# Patient Record
Sex: Male | Born: 1960 | Race: White | Hispanic: No | Marital: Married | Smoking: Never smoker
Health system: Southern US, Community
[De-identification: ages and names within clinical notes are randomized; demographics above are authoritative.]

## PROBLEM LIST (undated history)

## (undated) DIAGNOSIS — R519 Headache, unspecified: Secondary | ICD-10-CM

## (undated) DIAGNOSIS — J189 Pneumonia, unspecified organism: Secondary | ICD-10-CM

## (undated) DIAGNOSIS — Z8719 Personal history of other diseases of the digestive system: Secondary | ICD-10-CM

## (undated) DIAGNOSIS — K219 Gastro-esophageal reflux disease without esophagitis: Secondary | ICD-10-CM

## (undated) DIAGNOSIS — E039 Hypothyroidism, unspecified: Secondary | ICD-10-CM

## (undated) DIAGNOSIS — N400 Enlarged prostate without lower urinary tract symptoms: Secondary | ICD-10-CM

## (undated) DIAGNOSIS — M21372 Foot drop, left foot: Secondary | ICD-10-CM

## (undated) DIAGNOSIS — N529 Male erectile dysfunction, unspecified: Secondary | ICD-10-CM

## (undated) DIAGNOSIS — Z9989 Dependence on other enabling machines and devices: Secondary | ICD-10-CM

## (undated) DIAGNOSIS — M1990 Unspecified osteoarthritis, unspecified site: Secondary | ICD-10-CM

## (undated) DIAGNOSIS — I38 Endocarditis, valve unspecified: Secondary | ICD-10-CM

## (undated) DIAGNOSIS — I10 Essential (primary) hypertension: Secondary | ICD-10-CM

## (undated) DIAGNOSIS — D649 Anemia, unspecified: Secondary | ICD-10-CM

## (undated) DIAGNOSIS — I739 Peripheral vascular disease, unspecified: Secondary | ICD-10-CM

## (undated) DIAGNOSIS — J45909 Unspecified asthma, uncomplicated: Secondary | ICD-10-CM

## (undated) DIAGNOSIS — J302 Other seasonal allergic rhinitis: Secondary | ICD-10-CM

## (undated) DIAGNOSIS — G709 Myoneural disorder, unspecified: Secondary | ICD-10-CM

## (undated) DIAGNOSIS — E785 Hyperlipidemia, unspecified: Secondary | ICD-10-CM

## (undated) HISTORY — PX: BACK SURGERY: SHX140

## (undated) HISTORY — PX: PROSTATE BIOPSY: SHX241

## (undated) HISTORY — PX: UPPER GI ENDOSCOPY: SHX6162

## (undated) HISTORY — PX: COLONOSCOPY: SHX174

## (undated) HISTORY — PX: OTHER SURGICAL HISTORY: SHX169

## (undated) HISTORY — PX: HERNIA REPAIR: SHX51

## (undated) HISTORY — PX: HEMORRHOID SURGERY: SHX153

---

## 1980-07-27 HISTORY — PX: HAND SURGERY: SHX662

## 2017-07-27 HISTORY — PX: CARDIAC CATHETERIZATION: SHX172

## 2017-07-27 HISTORY — PX: VEIN SURGERY: SHX48

## 2020-07-08 ENCOUNTER — Other Ambulatory Visit: Payer: Self-pay | Admitting: Neurological Surgery

## 2020-07-08 DIAGNOSIS — M4802 Spinal stenosis, cervical region: Secondary | ICD-10-CM

## 2020-07-08 DIAGNOSIS — G959 Disease of spinal cord, unspecified: Secondary | ICD-10-CM

## 2020-08-05 NOTE — Pre-Procedure Instructions (Signed)
Your procedure is scheduled on Fri., Jan. 14, 2022 from 10:46AM-2:52PM.  Report to [facility] Entrance "A" at 8:45AM.  Call this number if you have problems the morning of surgery:  [phone]   Remember:  Do not eat or drink after midnight on Jan. 13th    Take these medicines the morning of surgery with A SIP OF WATER: Fluticasone (FLONASE)  Gabapentin (NEURONTIN)   Levothyroxine (SYNTHROID) Omeprazole (PRILOSEC) Rosuvastatin (CRESTOR)  If Needed: Albuterol (VENTOLIN HFA)- bring with you day of surgery Budesonide-formoterol (SYMBICORT)- bring with you day of surgery  As of today, STOP taking all Aspirin (unless instructed by your doctor) and Other Aspirin containing products, Vitamins, Fish oils, and Herbal medications. Also stop all NSAIDS i.e. Advil, Ibuprofen, Motrin, Aleve, Anaprox, Naproxen, BC, Goody Powders, and all Supplements.   No Smoking of any kind, Tobacco/Vaping, or Alcohol products 24 hours prior to your procedure. If you use a Cpap at night, you may bring all equipment for your overnight stay.   Special instructions:   [place]- Preparing For Surgery  Before surgery, you can play an important role. Because skin is not sterile, your skin needs to be as free of germs as possible. You can reduce the number of germs on your skin by washing with CHG (chlorahexidine gluconate) Soap before surgery.  CHG is an antiseptic cleaner which kills germs and bonds with the skin to continue killing germs even after washing.  Please do not use if you have an allergy to CHG or antibacterial soaps. If your skin becomes reddened/irritated stop using the CHG.  Do not shave (including legs and underarms) for at least 48 hours prior to first CHG shower. It is OK to shave your face.  Please follow these instructions carefully.   1. Shower the NIGHT BEFORE SURGERY and the MORNING OF SURGERY with CHG.   2. If you chose to wash your hair, wash your hair first as usual with  your normal shampoo.  3. After you shampoo, rinse your hair and body thoroughly to remove the shampoo.  4. Use CHG as you would any other liquid soap. You can apply CHG directly to the skin and wash gently with a scrungie or a clean washcloth.   5. Apply the CHG Soap to your body ONLY FROM THE NECK DOWN.  Do not use on open wounds or open sores. Avoid contact with your eyes, ears, mouth and genitals (private parts). Wash Face and genitals (private parts)  with your normal soap.  6. Wash thoroughly, paying special attention to the area where your surgery will be performed.  7. Thoroughly rinse your body with warm water from the neck down.  8. DO NOT shower/wash with your normal soap after using and rinsing off the CHG Soap.  9. Pat yourself dry with a CLEAN TOWEL.  10. Wear CLEAN PAJAMAS to bed the night before surgery, wear comfortable clothes the morning of surgery  11. Place CLEAN SHEETS on your bed the night of your first shower and DO NOT SLEEP WITH PETS.   Day of Surgery:             Remember to brush your teeth WITH YOUR REGULAR TOOTHPASTE.  Do not wear jewelry.  Do not wear lotions, powders, colognes, or deodorant.  Do not shave 48 hours prior to surgery.  Men may shave face and neck.  Do not bring valuables to the hospital.  [facility] is not responsible for any belongings or valuables.  Contacts,  dentures or bridgework may not be worn into surgery.    For patients admitted to the hospital, discharge time will be determined by your treatment team.  Patients discharged the day of surgery will not be allowed to drive home, and someone age 61 and over needs to stay with them for 24 hours.  Please wear clean clothes to the hospital/surgery center.    Please read over the following fact sheets that you were given.

## 2020-08-06 ENCOUNTER — Other Ambulatory Visit (HOSPITAL_COMMUNITY): Payer: Self-pay

## 2020-08-06 ENCOUNTER — Inpatient Hospital Stay (HOSPITAL_COMMUNITY)
Admission: RE | Admit: 2020-08-06 | Discharge: 2020-08-06 | Disposition: A | Discharge status: Home / Self Care | Payer: Self-pay | Source: Ambulatory Visit

## 2020-08-07 ENCOUNTER — Encounter (HOSPITAL_COMMUNITY): Payer: Self-pay | Admitting: Neurological Surgery

## 2020-08-07 ENCOUNTER — Other Ambulatory Visit: Payer: Self-pay

## 2020-08-07 NOTE — Progress Notes (Signed)
PCP - Dr Georgiann Cocker Cardiologist - [facility]- Dr Allena Katz Urology - Dr Elwin Sleight  Chest x-ray - DOS 08/09/20 EKG - DOS 08/09/20 Stress Test - n/a ECHO - n/a Cardiac Cath - 02/01/18 CE  Anesthesia review: Yes  STOP now taking any Aspirin (unless otherwise instructed by your surgeon), Aleve, Naproxen, Ibuprofen, Motrin, Advil, Goody's, BC's, all herbal medications, fish oil, and all vitamins.   Coronavirus Screening Covid test on DOS 08/09/20. Do you have any of the following symptoms:  Cough yes/no: No Fever (>100.79F)  yes/no: No Runny nose yes/no: No Sore throat yes/no: No Difficulty breathing/shortness of breath  yes/no: No  Have you traveled in the last 14 days and where? yes/no: No  Patient verbalized understanding of instructions that were given via phone.

## 2020-08-09 ENCOUNTER — Inpatient Hospital Stay (HOSPITAL_COMMUNITY)
Admission: RE | Disposition: A | Discharge status: Rehabilitation Facility | Payer: Self-pay | Source: Home / Self Care | Attending: Neurological Surgery

## 2020-08-09 ENCOUNTER — Inpatient Hospital Stay (HOSPITAL_COMMUNITY): Payer: BC Managed Care – PPO

## 2020-08-09 ENCOUNTER — Encounter (HOSPITAL_COMMUNITY): Payer: Self-pay | Admitting: Neurological Surgery

## 2020-08-09 ENCOUNTER — Inpatient Hospital Stay (HOSPITAL_COMMUNITY): Payer: BC Managed Care – PPO | Admitting: Anesthesiology

## 2020-08-09 ENCOUNTER — Inpatient Hospital Stay (HOSPITAL_COMMUNITY)
Admission: RE | Admit: 2020-08-09 | Discharge: 2020-08-14 | DRG: 472 | Disposition: A | Discharge status: Rehabilitation Facility | Payer: BC Managed Care – PPO | Attending: Neurological Surgery | Admitting: Neurological Surgery

## 2020-08-09 ENCOUNTER — Other Ambulatory Visit: Payer: Self-pay

## 2020-08-09 DIAGNOSIS — Z7951 Long term (current) use of inhaled steroids: Secondary | ICD-10-CM

## 2020-08-09 DIAGNOSIS — I739 Peripheral vascular disease, unspecified: Secondary | ICD-10-CM | POA: Diagnosis present

## 2020-08-09 DIAGNOSIS — Z981 Arthrodesis status: Secondary | ICD-10-CM | POA: Diagnosis not present

## 2020-08-09 DIAGNOSIS — Z7982 Long term (current) use of aspirin: Secondary | ICD-10-CM | POA: Diagnosis not present

## 2020-08-09 DIAGNOSIS — Z888 Allergy status to other drugs, medicaments and biological substances status: Secondary | ICD-10-CM | POA: Diagnosis not present

## 2020-08-09 DIAGNOSIS — E785 Hyperlipidemia, unspecified: Secondary | ICD-10-CM | POA: Diagnosis present

## 2020-08-09 DIAGNOSIS — G8918 Other acute postprocedural pain: Secondary | ICD-10-CM | POA: Diagnosis not present

## 2020-08-09 DIAGNOSIS — M50021 Cervical disc disorder at C4-C5 level with myelopathy: Secondary | ICD-10-CM | POA: Diagnosis present

## 2020-08-09 DIAGNOSIS — I10 Essential (primary) hypertension: Secondary | ICD-10-CM

## 2020-08-09 DIAGNOSIS — N319 Neuromuscular dysfunction of bladder, unspecified: Secondary | ICD-10-CM | POA: Diagnosis present

## 2020-08-09 DIAGNOSIS — M4712 Other spondylosis with myelopathy, cervical region: Secondary | ICD-10-CM | POA: Diagnosis present

## 2020-08-09 DIAGNOSIS — Z4789 Encounter for other orthopedic aftercare: Secondary | ICD-10-CM | POA: Diagnosis not present

## 2020-08-09 DIAGNOSIS — E039 Hypothyroidism, unspecified: Secondary | ICD-10-CM | POA: Diagnosis present

## 2020-08-09 DIAGNOSIS — Z79899 Other long term (current) drug therapy: Secondary | ICD-10-CM | POA: Diagnosis not present

## 2020-08-09 DIAGNOSIS — K219 Gastro-esophageal reflux disease without esophagitis: Secondary | ICD-10-CM | POA: Diagnosis present

## 2020-08-09 DIAGNOSIS — G8220 Paraplegia, unspecified: Secondary | ICD-10-CM | POA: Diagnosis not present

## 2020-08-09 DIAGNOSIS — Z20822 Contact with and (suspected) exposure to covid-19: Secondary | ICD-10-CM | POA: Diagnosis present

## 2020-08-09 DIAGNOSIS — Z419 Encounter for procedure for purposes other than remedying health state, unspecified: Secondary | ICD-10-CM

## 2020-08-09 DIAGNOSIS — J449 Chronic obstructive pulmonary disease, unspecified: Secondary | ICD-10-CM | POA: Diagnosis present

## 2020-08-09 DIAGNOSIS — R252 Cramp and spasm: Secondary | ICD-10-CM | POA: Diagnosis not present

## 2020-08-09 DIAGNOSIS — G959 Disease of spinal cord, unspecified: Secondary | ICD-10-CM

## 2020-08-09 DIAGNOSIS — Z79890 Hormone replacement therapy (postmenopausal): Secondary | ICD-10-CM | POA: Diagnosis not present

## 2020-08-09 DIAGNOSIS — M4802 Spinal stenosis, cervical region: Secondary | ICD-10-CM | POA: Diagnosis present

## 2020-08-09 DIAGNOSIS — E871 Hypo-osmolality and hyponatremia: Secondary | ICD-10-CM | POA: Diagnosis not present

## 2020-08-09 DIAGNOSIS — N400 Enlarged prostate without lower urinary tract symptoms: Secondary | ICD-10-CM | POA: Diagnosis present

## 2020-08-09 DIAGNOSIS — M5412 Radiculopathy, cervical region: Secondary | ICD-10-CM

## 2020-08-09 HISTORY — DX: Unspecified osteoarthritis, unspecified site: M19.90

## 2020-08-09 HISTORY — DX: Benign prostatic hyperplasia without lower urinary tract symptoms: N40.0

## 2020-08-09 HISTORY — DX: Essential (primary) hypertension: I10

## 2020-08-09 HISTORY — DX: Myoneural disorder, unspecified: G70.9

## 2020-08-09 HISTORY — DX: Dependence on other enabling machines and devices: Z99.89

## 2020-08-09 HISTORY — PX: ANTERIOR CERVICAL CORPECTOMY: SHX1159

## 2020-08-09 HISTORY — DX: Hyperlipidemia, unspecified: E78.5

## 2020-08-09 HISTORY — PX: ANTERIOR CERVICAL DECOMP/DISCECTOMY FUSION: SHX1161

## 2020-08-09 HISTORY — DX: Gastro-esophageal reflux disease without esophagitis: K21.9

## 2020-08-09 HISTORY — DX: Peripheral vascular disease, unspecified: I73.9

## 2020-08-09 HISTORY — DX: Unspecified asthma, uncomplicated: J45.909

## 2020-08-09 HISTORY — DX: Male erectile dysfunction, unspecified: N52.9

## 2020-08-09 HISTORY — DX: Other seasonal allergic rhinitis: J30.2

## 2020-08-09 HISTORY — DX: Endocarditis, valve unspecified: I38

## 2020-08-09 HISTORY — DX: Hypothyroidism, unspecified: E03.9

## 2020-08-09 HISTORY — DX: Anemia, unspecified: D64.9

## 2020-08-09 HISTORY — DX: Headache, unspecified: R51.9

## 2020-08-09 HISTORY — DX: Personal history of other diseases of the digestive system: Z87.19

## 2020-08-09 HISTORY — DX: Foot drop, left foot: M21.372

## 2020-08-09 LAB — CBC WITH DIFFERENTIAL/PLATELET
Abs Immature Granulocytes: 0.03 10*3/uL (ref 0.00–0.07)
Basophils Absolute: 0.1 10*3/uL (ref 0.0–0.1)
Basophils Relative: 1 %
Eosinophils Absolute: 0.3 10*3/uL (ref 0.0–0.5)
Eosinophils Relative: 3 %
HCT: 43.7 % (ref 39.0–52.0)
Hemoglobin: 14.8 g/dL (ref 13.0–17.0)
Immature Granulocytes: 0 %
Lymphocytes Relative: 18 %
Lymphs Abs: 1.5 10*3/uL (ref 0.7–4.0)
MCH: 29.8 pg (ref 26.0–34.0)
MCHC: 33.9 g/dL (ref 30.0–36.0)
MCV: 88.1 fL (ref 80.0–100.0)
Monocytes Absolute: 0.5 10*3/uL (ref 0.1–1.0)
Monocytes Relative: 5 %
Neutro Abs: 6.2 10*3/uL (ref 1.7–7.7)
Neutrophils Relative %: 73 %
Platelets: 187 10*3/uL (ref 150–400)
RBC: 4.96 MIL/uL (ref 4.22–5.81)
RDW: 12.3 % (ref 11.5–15.5)
WBC: 8.6 10*3/uL (ref 4.0–10.5)
nRBC: 0 % (ref 0.0–0.2)

## 2020-08-09 LAB — TYPE AND SCREEN
ABO/RH(D): O POS
Antibody Screen: NEGATIVE

## 2020-08-09 LAB — PROTIME-INR
INR: 1 (ref 0.8–1.2)
Prothrombin Time: 12.6 seconds (ref 11.4–15.2)

## 2020-08-09 LAB — BASIC METABOLIC PANEL
Anion gap: 11 (ref 5–15)
BUN: 17 mg/dL (ref 6–20)
CO2: 26 mmol/L (ref 22–32)
Calcium: 9.5 mg/dL (ref 8.9–10.3)
Chloride: 104 mmol/L (ref 98–111)
Creatinine, Ser: 1.03 mg/dL (ref 0.61–1.24)
GFR, Estimated: >60 mL/min (ref >60)
Glucose, Bld: 104 mg/dL — ABNORMAL HIGH (ref 70–99)
Potassium: 4.6 mmol/L (ref 3.5–5.1)
Sodium: 141 mmol/L (ref 135–145)

## 2020-08-09 LAB — SARS CORONAVIRUS 2 BY RT PCR (HOSPITAL ORDER, PERFORMED IN ~~LOC~~ HOSPITAL LAB): SARS Coronavirus 2: NEGATIVE

## 2020-08-09 LAB — ABO/RH: ABO/RH(D): O POS

## 2020-08-09 SURGERY — ANTERIOR CERVICAL DECOMPRESSION/DISCECTOMY FUSION 1 LEVEL
Anesthesia: General

## 2020-08-09 MED ORDER — ONDANSETRON HCL 4 MG PO TABS: 4.0000 mg | ORAL_TABLET | Freq: Four times a day (QID) | ORAL | Status: DC | PRN

## 2020-08-09 MED ORDER — SENNA 8.6 MG PO TABS
1.0000 | ORAL_TABLET | Freq: Two times a day (BID) | ORAL | Status: DC
  Administered 2020-08-09 – 2020-08-14 (×10): 8.6 mg via ORAL
  Filled 2020-08-09 (×10): qty 1

## 2020-08-09 MED ORDER — ONDANSETRON HCL 4 MG/2ML IJ SOLN
INTRAMUSCULAR | Status: DC | PRN
  Administered 2020-08-09: 4 mg via INTRAVENOUS

## 2020-08-09 MED ORDER — PHENYLEPHRINE 40 MCG/ML (10ML) SYRINGE FOR IV PUSH (FOR BLOOD PRESSURE SUPPORT)
PREFILLED_SYRINGE | INTRAVENOUS | Status: AC
  Filled 2020-08-09: qty 10

## 2020-08-09 MED ORDER — FENTANYL CITRATE (PF) 250 MCG/5ML IJ SOLN
INTRAMUSCULAR | Status: AC
  Filled 2020-08-09: qty 5

## 2020-08-09 MED ORDER — SUGAMMADEX SODIUM 200 MG/2ML IV SOLN
INTRAVENOUS | Status: DC | PRN
  Administered 2020-08-09 (×2): 100 mg via INTRAVENOUS

## 2020-08-09 MED ORDER — SODIUM CHLORIDE 0.9% FLUSH: 3.0000 mL | INTRAVENOUS | Status: DC | PRN

## 2020-08-09 MED ORDER — ALBUMIN HUMAN 5 % IV SOLN: INTRAVENOUS | Status: DC | PRN

## 2020-08-09 MED ORDER — MIDAZOLAM HCL 5 MG/5ML IJ SOLN
INTRAMUSCULAR | Status: DC | PRN
  Administered 2020-08-09: 2 mg via INTRAVENOUS

## 2020-08-09 MED ORDER — PHENYLEPHRINE 40 MCG/ML (10ML) SYRINGE FOR IV PUSH (FOR BLOOD PRESSURE SUPPORT)
PREFILLED_SYRINGE | INTRAVENOUS | Status: DC | PRN
  Administered 2020-08-09 (×3): 80 ug via INTRAVENOUS

## 2020-08-09 MED ORDER — ORAL CARE MOUTH RINSE: 15.0000 mL | Freq: Once | OROMUCOSAL | Status: AC

## 2020-08-09 MED ORDER — GABAPENTIN 300 MG PO CAPS
300.0000 mg | ORAL_CAPSULE | Freq: Three times a day (TID) | ORAL | Status: DC
  Administered 2020-08-09 – 2020-08-14 (×15): 300 mg via ORAL
  Filled 2020-08-09 (×15): qty 1

## 2020-08-09 MED ORDER — ALBUTEROL SULFATE HFA 108 (90 BASE) MCG/ACT IN AERS: 2.0000 | INHALATION_SPRAY | Freq: Four times a day (QID) | RESPIRATORY_TRACT | Status: DC | PRN

## 2020-08-09 MED ORDER — EPHEDRINE SULFATE-NACL 50-0.9 MG/10ML-% IV SOSY
PREFILLED_SYRINGE | INTRAVENOUS | Status: DC | PRN
  Administered 2020-08-09 (×3): 5 mg via INTRAVENOUS

## 2020-08-09 MED ORDER — CHLORHEXIDINE GLUCONATE CLOTH 2 % EX PADS: 6.0000 | MEDICATED_PAD | Freq: Once | CUTANEOUS | Status: DC

## 2020-08-09 MED ORDER — CEFAZOLIN SODIUM-DEXTROSE 2-4 GM/100ML-% IV SOLN
2.0000 g | Freq: Three times a day (TID) | INTRAVENOUS | Status: AC
  Administered 2020-08-09 – 2020-08-10 (×2): 2 g via INTRAVENOUS
  Filled 2020-08-09 (×2): qty 100

## 2020-08-09 MED ORDER — ONDANSETRON HCL 4 MG/2ML IJ SOLN
INTRAMUSCULAR | Status: AC
  Filled 2020-08-09: qty 2

## 2020-08-09 MED ORDER — GLYCOPYRROLATE 0.2 MG/ML IJ SOLN
INTRAMUSCULAR | Status: DC | PRN
  Administered 2020-08-09: .2 mg via INTRAVENOUS

## 2020-08-09 MED ORDER — LIDOCAINE 2% (20 MG/ML) 5 ML SYRINGE
INTRAMUSCULAR | Status: AC
  Filled 2020-08-09: qty 5

## 2020-08-09 MED ORDER — THROMBIN 5000 UNITS EX SOLR
CUTANEOUS | Status: AC
  Filled 2020-08-09: qty 5000

## 2020-08-09 MED ORDER — DEXAMETHASONE 4 MG PO TABS: 4.0000 mg | ORAL_TABLET | Freq: Four times a day (QID) | ORAL | Status: DC

## 2020-08-09 MED ORDER — THROMBIN (RECOMBINANT) 5000 UNITS EX SOLR
CUTANEOUS | Status: AC
  Filled 2020-08-09: qty 5000

## 2020-08-09 MED ORDER — HYDROCHLOROTHIAZIDE 12.5 MG PO CAPS
12.5000 mg | ORAL_CAPSULE | Freq: Every day | ORAL | Status: DC
  Administered 2020-08-09 – 2020-08-14 (×6): 12.5 mg via ORAL
  Filled 2020-08-09 (×6): qty 1

## 2020-08-09 MED ORDER — ACETAMINOPHEN 325 MG PO TABS
650.0000 mg | ORAL_TABLET | ORAL | Status: DC | PRN
  Administered 2020-08-09 – 2020-08-11 (×4): 650 mg via ORAL
  Filled 2020-08-09 (×4): qty 2

## 2020-08-09 MED ORDER — ACETAMINOPHEN 650 MG RE SUPP: 650.0000 mg | RECTAL | Status: DC | PRN

## 2020-08-09 MED ORDER — DEXAMETHASONE SODIUM PHOSPHATE 10 MG/ML IJ SOLN
10.0000 mg | Freq: Once | INTRAMUSCULAR | Status: AC
  Administered 2020-08-09: 10 mg via INTRAVENOUS
  Filled 2020-08-09: qty 1

## 2020-08-09 MED ORDER — METHOCARBAMOL 500 MG PO TABS
500.0000 mg | ORAL_TABLET | Freq: Four times a day (QID) | ORAL | Status: DC | PRN
  Administered 2020-08-09 – 2020-08-10 (×3): 500 mg via ORAL
  Filled 2020-08-09 (×3): qty 1

## 2020-08-09 MED ORDER — METHOCARBAMOL 1000 MG/10ML IJ SOLN
500.0000 mg | Freq: Four times a day (QID) | INTRAVENOUS | Status: DC | PRN
  Filled 2020-08-09: qty 5

## 2020-08-09 MED ORDER — ROCURONIUM BROMIDE 10 MG/ML (PF) SYRINGE
PREFILLED_SYRINGE | INTRAVENOUS | Status: DC | PRN
  Administered 2020-08-09: 70 mg via INTRAVENOUS

## 2020-08-09 MED ORDER — MIDAZOLAM HCL 2 MG/2ML IJ SOLN
INTRAMUSCULAR | Status: AC
  Filled 2020-08-09: qty 2

## 2020-08-09 MED ORDER — CEFAZOLIN SODIUM-DEXTROSE 2-4 GM/100ML-% IV SOLN
2.0000 g | INTRAVENOUS | Status: AC
  Administered 2020-08-09: 2 g via INTRAVENOUS
  Filled 2020-08-09: qty 100

## 2020-08-09 MED ORDER — GABAPENTIN 300 MG PO CAPS
300.0000 mg | ORAL_CAPSULE | ORAL | Status: DC
  Filled 2020-08-09: qty 1

## 2020-08-09 MED ORDER — GLYCOPYRROLATE PF 0.2 MG/ML IJ SOSY
PREFILLED_SYRINGE | INTRAMUSCULAR | Status: AC
  Filled 2020-08-09: qty 1

## 2020-08-09 MED ORDER — PHENYLEPHRINE HCL (PRESSORS) 10 MG/ML IV SOLN
INTRAVENOUS | Status: AC
  Filled 2020-08-09: qty 1

## 2020-08-09 MED ORDER — ONDANSETRON HCL 4 MG/2ML IJ SOLN: 4.0000 mg | Freq: Four times a day (QID) | INTRAMUSCULAR | Status: DC | PRN

## 2020-08-09 MED ORDER — PROPOFOL 10 MG/ML IV BOLUS
INTRAVENOUS | Status: DC | PRN
  Administered 2020-08-09: 150 mg via INTRAVENOUS

## 2020-08-09 MED ORDER — LACTATED RINGERS IV SOLN: INTRAVENOUS | Status: DC

## 2020-08-09 MED ORDER — HYDROMORPHONE HCL 1 MG/ML IJ SOLN
0.2500 mg | INTRAMUSCULAR | Status: DC | PRN
  Administered 2020-08-09: 0.5 mg via INTRAVENOUS

## 2020-08-09 MED ORDER — OXYCODONE HCL 5 MG PO TABS
5.0000 mg | ORAL_TABLET | ORAL | Status: DC | PRN
  Administered 2020-08-09 – 2020-08-14 (×13): 5 mg via ORAL
  Filled 2020-08-09 (×13): qty 1

## 2020-08-09 MED ORDER — DEXAMETHASONE SODIUM PHOSPHATE 4 MG/ML IJ SOLN
4.0000 mg | Freq: Four times a day (QID) | INTRAMUSCULAR | Status: DC
  Administered 2020-08-09: 4 mg via INTRAVENOUS
  Filled 2020-08-09: qty 1

## 2020-08-09 MED ORDER — MORPHINE SULFATE (PF) 2 MG/ML IV SOLN: 2.0000 mg | INTRAVENOUS | Status: DC | PRN

## 2020-08-09 MED ORDER — DEXAMETHASONE 4 MG PO TABS: 6.0000 mg | ORAL_TABLET | Freq: Four times a day (QID) | ORAL | Status: DC

## 2020-08-09 MED ORDER — MENTHOL 3 MG MT LOZG
1.0000 | LOZENGE | OROMUCOSAL | Status: DC | PRN
Start: 2020-08-09 — End: 2020-08-14

## 2020-08-09 MED ORDER — PROPOFOL 10 MG/ML IV BOLUS
INTRAVENOUS | Status: AC
  Filled 2020-08-09: qty 20

## 2020-08-09 MED ORDER — ADULT MULTIVITAMIN W/MINERALS CH
1.0000 | ORAL_TABLET | Freq: Every day | ORAL | Status: DC
  Administered 2020-08-09 – 2020-08-14 (×6): 1 via ORAL
  Filled 2020-08-09 (×6): qty 1

## 2020-08-09 MED ORDER — THROMBIN 20000 UNITS EX SOLR
CUTANEOUS | Status: AC
  Filled 2020-08-09: qty 20000

## 2020-08-09 MED ORDER — ROCURONIUM BROMIDE 10 MG/ML (PF) SYRINGE
PREFILLED_SYRINGE | INTRAVENOUS | Status: AC
  Filled 2020-08-09: qty 10

## 2020-08-09 MED ORDER — LEVOTHYROXINE SODIUM 25 MCG PO TABS
125.0000 ug | ORAL_TABLET | Freq: Every day | ORAL | Status: DC
  Administered 2020-08-10 – 2020-08-14 (×5): 125 ug via ORAL
  Filled 2020-08-09 (×5): qty 1

## 2020-08-09 MED ORDER — 0.9 % SODIUM CHLORIDE (POUR BTL) OPTIME
TOPICAL | Status: DC | PRN
  Administered 2020-08-09: 1000 mL

## 2020-08-09 MED ORDER — DEXAMETHASONE SODIUM PHOSPHATE 10 MG/ML IJ SOLN
8.0000 mg | Freq: Four times a day (QID) | INTRAMUSCULAR | Status: DC
  Administered 2020-08-09 – 2020-08-10 (×2): 8 mg via INTRAVENOUS
  Filled 2020-08-09 (×2): qty 1

## 2020-08-09 MED ORDER — FENTANYL CITRATE (PF) 250 MCG/5ML IJ SOLN
INTRAMUSCULAR | Status: DC | PRN
  Administered 2020-08-09: 50 ug via INTRAVENOUS
  Administered 2020-08-09: 100 ug via INTRAVENOUS

## 2020-08-09 MED ORDER — PHENYLEPHRINE HCL-NACL 10-0.9 MG/250ML-% IV SOLN
INTRAVENOUS | Status: DC | PRN
  Administered 2020-08-09: 30 ug/min via INTRAVENOUS

## 2020-08-09 MED ORDER — THROMBIN 20000 UNITS EX SOLR
CUTANEOUS | Status: DC | PRN
  Administered 2020-08-09: 20 mL via TOPICAL

## 2020-08-09 MED ORDER — LIDOCAINE 2% (20 MG/ML) 5 ML SYRINGE
INTRAMUSCULAR | Status: DC | PRN
  Administered 2020-08-09: 60 mg via INTRAVENOUS

## 2020-08-09 MED ORDER — MOMETASONE FURO-FORMOTEROL FUM 100-5 MCG/ACT IN AERO
2.0000 | INHALATION_SPRAY | Freq: Two times a day (BID) | RESPIRATORY_TRACT | Status: DC
  Administered 2020-08-09 – 2020-08-14 (×9): 2 via RESPIRATORY_TRACT
  Filled 2020-08-09: qty 8.8

## 2020-08-09 MED ORDER — EPHEDRINE 5 MG/ML INJ
INTRAVENOUS | Status: AC
  Filled 2020-08-09: qty 10

## 2020-08-09 MED ORDER — DEXAMETHASONE SODIUM PHOSPHATE 10 MG/ML IJ SOLN
INTRAMUSCULAR | Status: AC
  Filled 2020-08-09: qty 1

## 2020-08-09 MED ORDER — CHLORHEXIDINE GLUCONATE 0.12 % MT SOLN
15.0000 mL | Freq: Once | OROMUCOSAL | Status: AC
  Administered 2020-08-09: 15 mL via OROMUCOSAL
  Filled 2020-08-09: qty 15

## 2020-08-09 MED ORDER — IRBESARTAN 150 MG PO TABS
150.0000 mg | ORAL_TABLET | Freq: Every day | ORAL | Status: DC
  Administered 2020-08-09 – 2020-08-14 (×6): 150 mg via ORAL
  Filled 2020-08-09 (×6): qty 1

## 2020-08-09 MED ORDER — OLMESARTAN MEDOXOMIL-HCTZ 20-12.5 MG PO TABS: 1.0000 | ORAL_TABLET | Freq: Every day | ORAL | Status: DC

## 2020-08-09 MED ORDER — POTASSIUM CHLORIDE IN NACL 20-0.9 MEQ/L-% IV SOLN
INTRAVENOUS | Status: DC
  Filled 2020-08-09 (×4): qty 1000

## 2020-08-09 MED ORDER — BUPIVACAINE HCL (PF) 0.25 % IJ SOLN
INTRAMUSCULAR | Status: DC | PRN
  Administered 2020-08-09: 5 mL

## 2020-08-09 MED ORDER — THROMBIN 5000 UNITS EX SOLR
OROMUCOSAL | Status: DC | PRN
  Administered 2020-08-09 (×2): 5 mL via TOPICAL

## 2020-08-09 MED ORDER — ACETAMINOPHEN 500 MG PO TABS
1000.0000 mg | ORAL_TABLET | ORAL | Status: AC
  Administered 2020-08-09: 1000 mg via ORAL
  Filled 2020-08-09: qty 2

## 2020-08-09 MED ORDER — BUPIVACAINE HCL (PF) 0.25 % IJ SOLN
INTRAMUSCULAR | Status: AC
  Filled 2020-08-09: qty 30

## 2020-08-09 MED ORDER — HYDROMORPHONE HCL 1 MG/ML IJ SOLN
INTRAMUSCULAR | Status: AC
  Administered 2020-08-09: 0.5 mg via INTRAVENOUS
  Filled 2020-08-09: qty 1

## 2020-08-09 MED ORDER — SODIUM CHLORIDE 0.9% FLUSH
3.0000 mL | Freq: Two times a day (BID) | INTRAVENOUS | Status: DC
  Administered 2020-08-10 – 2020-08-14 (×7): 3 mL via INTRAVENOUS

## 2020-08-09 MED ORDER — PHENOL 1.4 % MT LIQD: 1.0000 | OROMUCOSAL | Status: DC | PRN

## 2020-08-09 SURGICAL SUPPLY — 57 items
APL SKNCLS STERI-STRIP NONHPOA (GAUZE/BANDAGES/DRESSINGS) ×1
BAND INSRT 18 STRL LF DISP RB (MISCELLANEOUS) ×2
BAND RUBBER #18 3X1/16 STRL (MISCELLANEOUS) ×4 IMPLANT
BASKET BONE COLLECTION (BASKET) ×2 IMPLANT
BENZOIN TINCTURE PRP APPL 2/3 (GAUZE/BANDAGES/DRESSINGS) ×2 IMPLANT
BUR CARBIDE MATCH 3.0 (BURR) ×2 IMPLANT
CAGE CERVICAL 7 (Cage) ×2 IMPLANT
CAGE CORPECTOMY 20 (Cage) ×2 IMPLANT
CANISTER SUCT 3000ML PPV (MISCELLANEOUS) ×2 IMPLANT
COVER WAND RF STERILE (DRAPES) ×2 IMPLANT
DIFFUSER DRILL AIR PNEUMATIC (MISCELLANEOUS) IMPLANT
DRAIN JP 10F RND SILICONE (MISCELLANEOUS) IMPLANT
DRAPE C-ARM 42X72 X-RAY (DRAPES) ×4 IMPLANT
DRAPE LAPAROTOMY 100X72 PEDS (DRAPES) ×2 IMPLANT
DRAPE MICROSCOPE LEICA (MISCELLANEOUS) ×2 IMPLANT
DRSG OPSITE POSTOP 4X6 (GAUZE/BANDAGES/DRESSINGS) ×2 IMPLANT
DURAPREP 6ML APPLICATOR 50/CS (WOUND CARE) ×2 IMPLANT
ELECT COATED BLADE 2.86 ST (ELECTRODE) ×2 IMPLANT
ELECT REM PT RETURN 9FT ADLT (ELECTROSURGICAL) ×2
ELECTRODE REM PT RTRN 9FT ADLT (ELECTROSURGICAL) ×1 IMPLANT
EVACUATOR SILICONE 100CC (DRAIN) IMPLANT
GAUZE 4X4 16PLY RFD (DISPOSABLE) IMPLANT
GLOVE BIO SURGEON STRL SZ7 (GLOVE) ×2 IMPLANT
GLOVE BIO SURGEON STRL SZ8 (GLOVE) ×4 IMPLANT
GLOVE BIOGEL PI IND STRL 7.0 (GLOVE) ×1 IMPLANT
GLOVE BIOGEL PI INDICATOR 7.0 (GLOVE) ×1
GOWN STRL REUS W/ TWL LRG LVL3 (GOWN DISPOSABLE) ×2 IMPLANT
GOWN STRL REUS W/ TWL XL LVL3 (GOWN DISPOSABLE) ×2 IMPLANT
GOWN STRL REUS W/TWL 2XL LVL3 (GOWN DISPOSABLE) IMPLANT
GOWN STRL REUS W/TWL LRG LVL3 (GOWN DISPOSABLE) ×4
GOWN STRL REUS W/TWL XL LVL3 (GOWN DISPOSABLE) ×4
HEMOSTAT POWDER KIT SURGIFOAM (HEMOSTASIS) ×4 IMPLANT
KIT BASIN OR (CUSTOM PROCEDURE TRAY) ×2 IMPLANT
KIT TURNOVER KIT B (KITS) ×2 IMPLANT
NEEDLE HYPO 25X1 1.5 SAFETY (NEEDLE) ×2 IMPLANT
NEEDLE SPNL 20GX3.5 QUINCKE YW (NEEDLE) ×2 IMPLANT
NS IRRIG 1000ML POUR BTL (IV SOLUTION) ×2 IMPLANT
PACK LAMINECTOMY NEURO (CUSTOM PROCEDURE TRAY) ×2 IMPLANT
PAD ARMBOARD 7.5X6 YLW CONV (MISCELLANEOUS) IMPLANT
PIN DISTRACTION 14MM (PIN) ×4 IMPLANT
PLATE 48MM (Plate) ×2 IMPLANT
PUTTY BONE 1CC ×2 IMPLANT
SCREW FA ST 4.0 16 (Screw) ×10 IMPLANT
SCREW HEX FA SD 4X16 (Screw) ×2 IMPLANT
SPACER PEEK NOVEL 17X15X21 5D (Spacer) ×2 IMPLANT
SPACER SPNL 7D LRG 16X14X7XNS (Cage) ×1 IMPLANT
SPCR SPNL 7D LRG 16X14X7XNS (Cage) ×1 IMPLANT
SPONGE INTESTINAL PEANUT (DISPOSABLE) ×2 IMPLANT
SPONGE SURGIFOAM ABS GEL 100 (HEMOSTASIS) ×2 IMPLANT
SPONGE SURGIFOAM ABS GEL SZ50 (HEMOSTASIS) IMPLANT
STRIP CLOSURE SKIN 1/2X4 (GAUZE/BANDAGES/DRESSINGS) ×2 IMPLANT
SUT VIC AB 3-0 SH 8-18 (SUTURE) ×2 IMPLANT
SUT VIC AB 4-0 PS2 18 (SUTURE) IMPLANT
SUT VICRYL 4-0 PS2 18IN ABS (SUTURE) ×2 IMPLANT
TOWEL GREEN STERILE (TOWEL DISPOSABLE) ×2 IMPLANT
TOWEL GREEN STERILE FF (TOWEL DISPOSABLE) ×2 IMPLANT
WATER STERILE IRR 1000ML POUR (IV SOLUTION) ×2 IMPLANT

## 2020-08-09 NOTE — Transfer of Care (Signed)
Immediate Anesthesia Transfer of Care Note  Patient: Keith Downs  Procedure(s) Performed: Anterior Cervical Decompression Fusion  - Cervical three-Cervical four (N/A ) Corpectomy Cervical five with anterior plating Cervical three-Cervical six (N/A )  Patient Location: PACU  Anesthesia Type:General  Level of Consciousness: awake, alert  and oriented  Airway & Oxygen Therapy: Patient Spontanous Breathing and Patient connected to face mask oxygen  Post-op Assessment: Report given to RN and Post -op Vital signs reviewed and stable  Post vital signs: Reviewed and stable  Last Vitals:  Vitals Value Taken Time  BP    Temp    Pulse 92 08/09/20 1412  Resp 20 08/09/20 1412  SpO2 95 % 08/09/20 1412  Vitals shown include unvalidated device data.  Last Pain:  Vitals:   08/09/20 0755  TempSrc:   PainSc: 0-No pain      Patients Stated Pain Goal: 3 (08/09/20 0755)  Complications: No complications documented.

## 2020-08-09 NOTE — Anesthesia Procedure Notes (Addendum)
Procedure Name: Intubation Date/Time: 08/09/2020 10:22 AM Performed by: Laruth Bouchard., CRNA Pre-anesthesia Checklist: Patient identified, Emergency Drugs available, Suction available, Patient being monitored and Timeout performed Patient Re-evaluated:Patient Re-evaluated prior to induction Oxygen Delivery Method: Circle system utilized Preoxygenation: Pre-oxygenation with 100% oxygen Induction Type: IV induction Ventilation: Mask ventilation without difficulty and Oral airway inserted - appropriate to patient size Laryngoscope Size: Glidescope and 4 Grade View: Grade I Tube type: Oral Tube size: 7.5 mm Number of attempts: 1 Airway Equipment and Method: Rigid stylet and Video-laryngoscopy Placement Confirmation: ETT inserted through vocal cords under direct vision,  positive ETCO2 and breath sounds checked- equal and bilateral Secured at: 23 cm Tube secured with: Tape Dental Injury: Teeth and Oropharynx as per pre-operative assessment  Comments: Elective glidescope use to eliminate neck manipulation, head and neck remained in natural alignment throughout induction and intubation

## 2020-08-09 NOTE — Anesthesia Postprocedure Evaluation (Signed)
Anesthesia Post Note  Patient: Keith Downs  Procedure(s) Performed: Anterior Cervical Decompression Fusion  - Cervical three-Cervical four (N/A ) Corpectomy Cervical five with anterior plating Cervical three-Cervical six (N/A )     Patient location during evaluation: PACU Anesthesia Type: General Level of consciousness: awake and alert Pain management: pain level controlled Vital Signs Assessment: post-procedure vital signs reviewed and stable Respiratory status: spontaneous breathing, nonlabored ventilation, respiratory function stable and patient connected to nasal cannula oxygen Cardiovascular status: blood pressure returned to baseline and stable Postop Assessment: no apparent nausea or vomiting Anesthetic complications: no   No complications documented.  Last Vitals:  Vitals:   08/09/20 1405 08/09/20 1420  BP: 119/84 119/84  Pulse: (!) 126 91  Resp: 16 (!) 22  Temp: 36.5 C   SpO2: 96% 98%    Last Pain:  Vitals:   08/09/20 1405  TempSrc:   PainSc: 5                  Teng Decou,W. EDMOND

## 2020-08-09 NOTE — H&P (Signed)
Subjective:   Patient is a 60 y.o. male admitted for myelopathy. The patient first presented to me with complaints of arm pain, numbness of the arm(s) and gait disturbance. Onset of symptoms was several months ago. The pain is described as aching and occurs intermittently. The pain is rated mild, and is located in the neck and radiates to the arms. The symptoms have been progressive. Symptoms are exacerbated by extending head backwards, and are relieved by none.  Previous work up includes MRI of cervical spine, results: spinal stenosis.  Past Medical History:  Diagnosis Date  . Ambulates with cane   . Anemia   . Arthritis    back, knee  . Asthma   . BPH (benign prostatic hyperplasia)   . ED (erectile dysfunction)   . Foot drop, left    some in right foot  . GERD (gastroesophageal reflux disease)   . Headache   . History of hiatal hernia    patient denied this dx  . HLD (hyperlipidemia)   . Hypertension   . Hypothyroidism   . Neuromuscular disorder (HCC)    numbness in right hand  . Peripheral vascular disease (HCC)   . Seasonal allergies     Past Surgical History:  Procedure Laterality Date  . BACK SURGERY     L5-S1  . CARDIAC CATHETERIZATION  2019  . COLONOSCOPY     multiple  . cyst removal     from head  . HAND SURGERY  1982  . HEMORRHOID SURGERY    . HERNIA REPAIR Bilateral   . PROSTATE BIOPSY    . UPPER GI ENDOSCOPY     multiple  . VEIN SURGERY  2019    Allergies  Allergen Reactions  . Niacin And Related     Face flushed    Social History   Tobacco Use  . Smoking status: Never Smoker  . Smokeless tobacco: Never Used  Substance Use Topics  . Alcohol use: Yes    Alcohol/week: 6.0 standard drinks    Types: 6 Cans of beer per week    History reviewed. No pertinent family history. Prior to Admission medications   Medication Sig Start Date End Date Taking? Authorizing Provider  albuterol (VENTOLIN HFA) 108 (90 Base) MCG/ACT inhaler Inhale 2 puffs into the  lungs every 6 (six) hours as needed for wheezing or shortness of breath.   Yes [provider]  fluticasone (FLONASE) 50 MCG/ACT nasal spray Place 2 sprays into both nostrils daily.   Yes [provider]  gabapentin (NEURONTIN) 300 MG capsule Take 300 mg by mouth 3 (three) times daily.   Yes [provider]  ibuprofen (ADVIL) 800 MG tablet Take 800 mg by mouth 3 (three) times daily.   Yes [provider]  levothyroxine (SYNTHROID) 125 MCG tablet Take 125 mcg by mouth daily before breakfast.   Yes [provider]  Multiple Vitamin (MULTIVITAMIN WITH MINERALS) TABS tablet Take 1 tablet by mouth daily.   Yes [provider]  olmesartan-hydrochlorothiazide (BENICAR HCT) 20-12.5 MG tablet Take 1 tablet by mouth daily.   Yes [provider]  omeprazole (PRILOSEC) 40 MG capsule Take 40 mg by mouth daily.   Yes [provider]  rosuvastatin (CRESTOR) 10 MG tablet Take 10 mg by mouth daily.   Yes [provider]  aspirin EC 81 MG tablet Take 81 mg by mouth daily. Swallow whole.    [provider]  budesonide-formoterol (SYMBICORT) 80-4.5 MCG/ACT inhaler Inhale 2 puffs into the  lungs 2 (two) times daily as needed (shortness of breath).    [provider]     Review of Systems  Positive ROS: neg  All other systems have been reviewed and were otherwise negative with the exception of those mentioned in the HPI and as above.  Objective: Vital signs in last 24 hours: Temp:  [97.9 F (36.6 C)] 97.9 F (36.6 C) (01/14 0702) Pulse Rate:  [87] 87 (01/14 0702) Resp:  [20] 20 (01/14 0702) BP: (125)/(80) 125/80 (01/14 0702) SpO2:  [100 %] 100 % (01/14 0702) Weight:  [88 kg] 88 kg (01/14 0702)  General Appearance: Alert, cooperative, no distress, appears stated age Head: Normocephalic, without obvious abnormality, atraumatic Eyes: PERRL, conjunctiva/corneas clear, EOM's intact      Neck: Supple, symmetrical,  trachea midline, Back: Symmetric, no curvature, ROM normal, no CVA tenderness Lungs:  respirations unlabored Heart: Regular rate and rhythm Abdomen: Soft, non-tender Extremities: Extremities normal, atraumatic, no cyanosis or edema Pulses: 2+ and symmetric all extremities Skin: Skin color, texture, turgor normal, no rashes or lesions  NEUROLOGIC:  Mental status: Alert and oriented x4, no aphasia, good attention span, fund of knowledge and memory  Motor Exam - grossly normal Sensory Exam - grossly normal Reflexes: 3+ Coordination - grossly normal Gait - spastic Balance - grossly normal Cranial Nerves: I: smell Not tested  II: visual acuity  OS: nl    OD: nl  II: visual fields Full to confrontation  II: pupils Equal, round, reactive to light  III,VII: ptosis None  III,IV,VI: extraocular muscles  Full ROM  V: mastication Normal  V: facial light touch sensation  Normal  V,VII: corneal reflex  Present  VII: facial muscle function - upper  Normal  VII: facial muscle function - lower Normal  VIII: hearing Not tested  IX: soft palate elevation  Normal  IX,X: gag reflex Present  XI: trapezius strength  5/5  XI: sternocleidomastoid strength 5/5  XI: neck flexion strength  5/5  XII: tongue strength  Normal    Data Review Lab Results  Component Value Date   WBC 8.6 08/09/2020   HGB 14.8 08/09/2020   HCT 43.7 08/09/2020   MCV 88.1 08/09/2020   PLT 187 08/09/2020   Lab Results  Component Value Date   NA 141 08/09/2020   K 4.6 08/09/2020   CL 104 08/09/2020   CO2 26 08/09/2020   BUN 17 08/09/2020   CREATININE 1.03 08/09/2020   GLUCOSE 104 (H) 08/09/2020   Lab Results  Component Value Date   INR 1.0 08/09/2020    Assessment:   Cervical neck pain with herniated nucleus pulposus/ spondylosis/ 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 Estimated body mass index is 27.06 kg/m as calculated from the following:   Height as of this encounter: 5\' 11"  (1.803 m).    Weight as of this encounter: 88 kg.  Patient has failed conservative therapy. Planned surgery : C3-4 ACDF, c5 corpectomy  Plan:   I explained the condition and procedure to the patient and answered any questions.  Patient wishes to proceed with procedure as planned. Understands risks/ benefits/ and expected or typical outcomes.  08/09/2020 10:02 AM

## 2020-08-09 NOTE — Progress Notes (Signed)
He continues to improve slowly but surely. RUE 4/5, LUE 4- to 4/5 excpet L grip but now 2-3/5. Can curl all fingers on L now. RLE with 3/5 KE, 2/5 DF/PF, and can now wiggle toes on L with 1/5 DF/PF and KE 2/5, can now abduct and internally rotate legs at the hips.   MRI reviewed around 4 pm - no obvious ongoing compression of cord, no hematoma, myelomalacia of cord as expected, small area of hyperintensity in the L hemicord at C4-5 that may be acute or chronic. I'm encouraged by his slow improvement and the MRI. He seems to have a great attitude.

## 2020-08-09 NOTE — Progress Notes (Signed)
Unfortunately in the recovery room the patient is weaker than he was before surgery. He appears to have good strength in his deltoids and biceps, with 4 out of 5 right wrist extension and grip, 3 out of 5 left wrist extension and 0 out of 5 grip on the left. There is no real movement in his left lower extremity. There is tone. He has increased tone in his right lower extremity and can extend his foot off the bed somewhat but has no dorsi or plantar flexion. His strength has improved somewhat while in the recovery room so I do not believe this is a postoperative epidural hematoma. I suspect this is from the typical risks such as the intubation, the positioning, reexpansion of the cord during surgery, or manipulation of the cord during surgery. I suspect this may be from cord reexpansion with the decompression given the pattern of his weakness. He was worse on his left side before surgery also. A Foley catheter has been placed and his heart rate has decreased. 400 cc of urine were removed. I'm going to get an MRI of the cervical spine to look at the decompression. I have spoken to his wife at length. I have spoken to the patient at length. I spoke with the patient at length in the holding room before the surgery about the risks to the spinal cord with the surgery given the critical cord compression and spinal stenosis he had before the surgery with a 2 to 3 mm canal, and the risk of postoperative worsening from direct manipulation during surgery, intubation, positioning, or cord reexpansion during the surgery. He demonstrated understanding. We will pray that this will improve with time.

## 2020-08-09 NOTE — Anesthesia Preprocedure Evaluation (Addendum)
Anesthesia Evaluation  Patient identified by MRN, date of birth, ID band Patient awake    Reviewed: Allergy & Precautions, H&P , NPO status , Patient's Chart, lab work & pertinent test results  Airway Mallampati: III  TM Distance: >3 FB Neck ROM: Limited    Dental no notable dental hx.    Pulmonary asthma ,    Pulmonary exam normal breath sounds clear to auscultation       Cardiovascular hypertension, Pt. on medications  Rhythm:Regular Rate:Normal     Neuro/Psych  Headaches, negative psych ROS   GI/Hepatic Neg liver ROS, GERD  Medicated,  Endo/Other  Hypothyroidism   Renal/GU negative Renal ROS  negative genitourinary   Musculoskeletal  (+) Arthritis , Osteoarthritis,    Abdominal   Peds  Hematology  (+) Blood dyscrasia, anemia ,   Anesthesia Other Findings   Reproductive/Obstetrics negative OB ROS                            Anesthesia Physical Anesthesia Plan  ASA: III  Anesthesia Plan: General   Post-op Pain Management:    Induction: Intravenous  PONV Risk Score and Plan: 3 and Ondansetron, Dexamethasone and Midazolam  Airway Management Planned: Oral ETT and Video Laryngoscope Planned  Additional Equipment:   Intra-op Plan:   Post-operative Plan: Extubation in OR  Informed Consent: I have reviewed the patients History and Physical, chart, labs and discussed the procedure including the risks, benefits and alternatives for the proposed anesthesia with the patient or authorized representative who has indicated his/her understanding and acceptance.     Dental advisory given  Plan Discussed with: CRNA  Anesthesia Plan Comments:        Anesthesia Quick Evaluation

## 2020-08-09 NOTE — Progress Notes (Signed)
Spoke with wife and updated her on the patient. MRI shows improvement in spinal stenosis at C3-4, c4-5, and c5-6. There is evidence of hyperintense signal at C4-5 on the left. No evidence of hematoma. It is difficult to compare this to his last mri as he had such severe stenosis and cord compression with a 26mm cord that you couldn't see any signal change. I suspect that his weakness is from cord reexpansion after being compressed so severely for 8 months. His neurologic exam is actually slowly improving even in the last two hours postop. He is now able to move his left fingers slightly, still no hand grip, he is moving both of his legs a little now as well with improvement in his knee extension bilaterally (R3/5, L2/5) . He is very sightly wiggling his toes bilaterally. He is on decadron. We will give this time for his cord to heal as we are seeing him improve with every hour.

## 2020-08-09 NOTE — Progress Notes (Signed)
Patient transported for cervical MRI after evaluation by Dr. Yetta Barre at the bedside.  Hermina Barters, RN

## 2020-08-09 NOTE — Op Note (Signed)
08/09/2020  1:53 PM  PATIENT:  Keith Downs  60 y.o. 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 Decompressive anterior cervical discectomy C3-4, 2.  Decompressive anterior cervical corpectomy C5, 3. anterior cervical arthrodesis C3-4 and C4-6 a utilizing peek interbody cages packed with locally harvested morcellized autologous bone graft and DBM putty, 3. Anterior cervical plating C3-6 utilizing an ATEC plate  SURGEON:  Marikay Alar, MD  ASSISTANTS: Dr. Maisie Fus  ANESTHESIA:   General  EBL: 300 ml  Total I/O In: 1350 [I.V.:1000; IV Piggyback:350] Out: 300 [Blood:300]  BLOOD ADMINISTERED: none  DRAINS: none  SPECIMEN:  none  INDICATION FOR PROCEDURE: This patient presented with difficulty with gait. Imaging showed severe cervical spinal stenosis at C3-4 C4-5 and C5-6. The patient tried conservative measures without relief. Pain was mild but he had significant neurologic compromise. Recommended ACDF with plating C3-4 with a C5 corpectomy to fully decompress the cervical cord. Patient understood the risks, benefits, and alternatives and potential outcomes and wished to proceed.  PROCEDURE DETAILS: Patient was brought to the operating room placed under general endotracheal anesthesia. Patient was placed in the supine position on the operating room table. The neck was prepped with Duraprep and draped in a sterile fashion.   Three cc of local anesthesia was injected and a transverse incision was made on the right side of the neck.  Dissection was carried down thru the subcutaneous tissue and the platysma was  elevated, opened, and undermined with Metzenbaum scissors.  Dissection was then carried out thru an avascular plane leaving the sternocleidomastoid carotid artery and jugular vein laterally and the trachea and esophagus medially  with the assistance of my nurse practitioner. The ventral aspect of the vertebral column was identified and a localizing x-ray was taken. The C3-C6 level was identified and all in the room agreed with the level. The longus colli muscles were then elevated and the retractor was placed with the assistance of my nurse practitioner. The annulus was incised and the disc space entered at all 3 levels. Discectomy was performed with micro-curettes and pituitary rongeurs. I then used the high-speed drill to drill the endplates down to the level of the posterior longitudinal ligament. The drill shavings were saved in a mucous trap for later arthrodesis. The operating microscope was draped and brought into the field provided additional magnification, illumination and visualization. Discectomy was continued posteriorly thru the disc space.  At C5 the anterior half of the vertebral body was removed with a Leksell rongeur.  Posterior longitudinal ligament was opened with a nerve hook, and then removed along with disc herniation and osteophytes, decompressing the spinal canal and thecal sac. We then continued to remove osteophytic overgrowth and disc material decompressing the neural foramina and exiting nerve roots bilaterally.  There was severe compression of the thecal sac in the midline just below the C5 superior endplate.  At first I could see the cord through the dura and it was mildly pulsatile.  Once the decompression complete the cord could be seen fully pulsatile. That seemed better to [place].   We continued to peel away the posterior logical ligament from the dura and gently undercut it while removing the calcified ligament and disc just distal to the C4-5 disc space.  We were careful to gently remove this and not cause any downward pressure on the cord.  We carried our decompression quite widely and  undercut until we could see the lateral edges of the dura bilaterally.  The scope was angled up and down to help decompress  and undercut the vertebral bodies.  At C3-4 we also undercut the vertebral bodies and remove the posterior lateral ligament to decompress the canal. once the decompression was completed we could pass a nerve hook circumferentially to assure adequate decompression in the midline and in the neural foramina. So by both visualization and palpation we felt we had an adequate decompression of the neural elements. We then measured the height of the intravertebral disc space and selected a 7 millimeter peek interbody cage packed with autograft and DBM putty for C3-4 and a 21 mm corpectomy cage for the C5 corpectomy. It was then gently positioned in the intravertebral disc space(s) and countersunk. I then used a 48 mm Alphatec plate and placed 58mm fixed angle screws into the vertebral bodies of C3-C4 and C6 and locked them into position. The wound was irrigated with bacitracin solution, checked for hemostasis which was established and confirmed. Once meticulous hemostasis was achieved, we then proceeded with closure with the assistance of my nurse practitioner. The platysma was closed with interrupted 3-0 undyed Vicryl suture, the subcuticular layer was closed with interrupted 3-0 undyed Vicryl suture. The skin edges were approximated with steristrips. The drapes were removed. A sterile dressing was applied. The patient was then awakened from general anesthesia and transferred to the recovery room in stable condition. At the end of the procedure all sponge, needle and instrument counts were correct.   PLAN OF CARE: Admit to inpatient   PATIENT DISPOSITION:  PACU - hemodynamically stable.   Delay start of Pharmacological VTE agent (>24hrs) due to surgical blood loss or risk of bleeding:  yes

## 2020-08-10 MED ORDER — CHLORHEXIDINE GLUCONATE CLOTH 2 % EX PADS
6.0000 | MEDICATED_PAD | Freq: Every day | CUTANEOUS | Status: DC
  Administered 2020-08-10 – 2020-08-12 (×3): 6 via TOPICAL

## 2020-08-10 MED ORDER — DEXAMETHASONE 4 MG PO TABS
6.0000 mg | ORAL_TABLET | Freq: Four times a day (QID) | ORAL | Status: DC
Start: 2020-08-10 — End: 2020-08-12
  Administered 2020-08-10 – 2020-08-11 (×5): 6 mg via ORAL
  Filled 2020-08-10 (×6): qty 2

## 2020-08-10 MED ORDER — DEXAMETHASONE SODIUM PHOSPHATE 4 MG/ML IJ SOLN
4.0000 mg | Freq: Four times a day (QID) | INTRAMUSCULAR | Status: DC
  Administered 2020-08-12 (×2): 4 mg via INTRAVENOUS
  Filled 2020-08-10 (×2): qty 1

## 2020-08-10 MED ORDER — BACLOFEN 10 MG PO TABS
5.0000 mg | ORAL_TABLET | Freq: Three times a day (TID) | ORAL | Status: DC
  Administered 2020-08-10 – 2020-08-14 (×13): 5 mg via ORAL
  Filled 2020-08-10 (×13): qty 1

## 2020-08-10 NOTE — Progress Notes (Signed)
Inpatient Rehab Admissions Coordinator:   Met with patient at bedside to discuss potential CIR admission. Pt. Stated interest. Will pursue for potential admit next week, pending bed availability and insurance auth.   Steven Veazie, MS, CCC-SLP Rehab Admissions Coordinator  [phone] (celll) [phone] (office)  

## 2020-08-10 NOTE — Progress Notes (Signed)
Orthopedic Tech Progress Note Patient Details:  Keith Downs [birth year] [id number]      Post Interventions Patient Tolerated: Well Instructions Provided: Care of device,Poper ambulation with device   [name] 08/10/2020, 12:29 AM

## 2020-08-10 NOTE — Evaluation (Signed)
Physical Therapy Evaluation Patient Details Name: Keith Downs MRN: [id number] DOB: 1961/01/10 Today's Date: 08/10/2020   History of Present Illness  Pt is a 36 male s/p arm pain, numbness and gait distubance. Pt is requiring decompressive anterior cervical discectomy C3-4;Decompressive anterior cervical corpectomy; Anterior cervical plating C3-6. PMHx:HTN, back sx, L foot drop.  Clinical Impression  Patient is s/p above surgery resulting in functional limitations due to the deficits listed below (see PT Problem List). Pt with significant limitations with strength and functional mobility. Patient will benefit from skilled PT to increase their independence and safety with mobility. Feel pt would make an excellent candidate for CIR given his motivation and potential for functional recover. Both the pt and his wife are interested in this option.   Follow Up Recommendations CIR;Supervision for mobility/OOB    Equipment Recommendations  Wheelchair (measurements PT)    Recommendations for Other Services Rehab consult     Precautions / Restrictions Precautions Precautions: Cervical;Fall Precaution Booklet Issued: Yes (comment) Precaution Comments: discussion of neck/cervical precautions; able to state with visual cues 3/3. Required Braces or Orthoses: Cervical Brace Cervical Brace: Hard collar;At all times Restrictions Weight Bearing Restrictions: No      Mobility  Bed Mobility Overal bed mobility: Needs Assistance (Pt was sitting up on EOB with OT when PT arrived)            Transfers Overall transfer level: Needs assistance Equipment used: 2 person hand held assist Transfers: Sit to/from Stand;Stand Pivot Transfers Sit to Stand: Max assist;+2 physical assistance;+2 safety/equipment;From elevated surface Stand pivot transfers: Max assist;+2 physical assistance;+2 safety/equipment;From elevated surface       General transfer comment: Assist for L knee blocking and  guidance to R for recliner. Pt held to PT during standing and transfer  Ambulation/Gait Ambulation/Gait assistance:  (Unable today due to lower extremity weakness)              Stairs            Wheelchair Mobility    Modified Rankin (Stroke Patients Only)       Balance Overall balance assessment: Needs assistance Sitting-balance support: Bilateral upper extremity supported;Feet supported Sitting balance-Leahy Scale: Fair     Standing balance support: Bilateral upper extremity supported Standing balance-Leahy Scale: Zero Standing balance comment: Pt requiring maxA to totalA to stand and pivot to recliner;LLE sliding and assisted to pivot; RLE weakness with any movement                             Pertinent Vitals/Pain Pain Assessment: 0-10 Faces Pain Scale: Hurts little more Pain Location: neck, legs Pain Descriptors / Indicators: Discomfort;Sore;Spasm Pain Intervention(s): Limited activity within patient's tolerance;Monitored during session;Premedicated before session    Home Living Family/patient expects to be discharged to:: Private residence Living Arrangements: Spouse/significant other Available Help at Discharge: Family;Available 24 hours/day Type of Home: House Home Access: Stairs to enter Entrance Stairs-Rails: Can reach both Entrance Stairs-Number of Steps: 3 Home Layout: One level Home Equipment: Walker - 2 wheels;Cane - single point Additional Comments: "unsteady grab bar in shower"    Prior Function Level of Independence: Independent with assistive device(s)         Comments: use of SPC, slow gait and L foot drag     Hand Dominance   Dominant Hand: Right    Extremity/Trunk Assessment       Lower Extremity Assessment Lower Extremity Assessment: RLE deficits/detail;LLE deficits/detail RLE Deficits /  Details: Coordination and sensation deficits RLE:  (Grossly 3+/5 . Tends to move in synergy patterns, but is able to  isolate when focused) RLE Sensation: decreased proprioception RLE Coordination: decreased gross motor LLE Deficits / Details: Grossly 2/5 strength throughout. Not all muscle groups isolated for testing. LLE:  (Moves in synergistic pattern and has more difficulty isolating muscles than he does on the RLE) LLE Sensation: decreased proprioception LLE Coordination: decreased gross motor    Cervical / Trunk Assessment Cervical / Trunk Assessment: Other exceptions Cervical / Trunk Exceptions: s/p cervical sx  Communication   Communication: No difficulties  Cognition Arousal/Alertness: Awake/alert Behavior During Therapy: Impulsive Overall Cognitive Status: Within Functional Limits for tasks assessed                                 General Comments: Pt requiring cues to slow down for safety, but pt able to follow all commands. Pt explained that he is very motivated to return to PLOF.      General Comments General comments (skin integrity, edema, etc.): Pt's spouse present for entire session. Pt needed cues to not rush and to be sure that safety is the top priority. encouraged pt not to try to do too much so as not to damage the surgical repair that was done. Pt is very motivated to regain his function and mobility.    Exercises     Assessment/Plan    PT Assessment Patient needs continued PT services  PT Problem List Decreased strength;Decreased range of motion;Decreased balance;Decreased mobility;Decreased coordination;Decreased knowledge of use of DME;Decreased knowledge of precautions;Pain       PT Treatment Interventions DME instruction;Gait training;Stair training;Therapeutic exercise;Neuromuscular re-education;Patient/family education;Balance training    PT Goals (Current goals can be found in the Care Plan section)  Acute Rehab PT Goals Patient Stated Goal: to walk PT Goal Formulation: With patient/family Time For Goal Achievement: 08/24/20 Potential to Achieve  Goals: Good    Frequency Min 5X/week   Barriers to discharge        Co-evaluation PT/OT/SLP Co-Evaluation/Treatment: Yes Reason for Co-Treatment: Complexity of the patient's impairments (multi-system involvement);For patient/therapist safety;To address functional/ADL transfers           AM-PAC PT "6 Clicks" Mobility  Outcome Measure Help needed turning from your back to your side while in a flat bed without using bedrails?: A Lot Help needed moving from lying on your back to sitting on the side of a flat bed without using bedrails?: A Lot Help needed moving to and from a bed to a chair (including a wheelchair)?: A Lot Help needed standing up from a chair using your arms (e.g., wheelchair or bedside chair)?: Total Help needed to walk in hospital room?: Total Help needed climbing 3-5 steps with a railing? : Total 6 Click Score: 9    End of Session Equipment Utilized During Treatment: Gait belt;Cervical collar Activity Tolerance: Patient tolerated treatment well Patient left: in chair;with call bell/phone within reach;with family/visitor present Nurse Communication: Mobility status PT Visit Diagnosis: Muscle weakness (generalized) (M62.81);Unsteadiness on feet (R26.81)    Time: 9983-3825 PT Time Calculation (min) (ACUTE ONLY): 29 min   Charges:   PT Evaluation $PT Eval Moderate Complexity: 1 Mod          Lavona Mound, PT   Acute Rehabilitation Services  Pager [phone] Office [phone] 08/10/2020   Donnella Sham 08/10/2020, 10:19 AM

## 2020-08-10 NOTE — Evaluation (Addendum)
Occupational Therapy Evaluation Patient Details Name: Keith Downs MRN: [id number] DOB: November 15, 1960 Today's Date: 08/10/2020    History of Present Illness Pt is a 19 male s/p arm pain, numbness and gait distubance. Pt is requiring decompressive anterior cervical discectomy C3-4;Decompressive anterior cervical corpectomy; Anterior cervical plating C3-6. PMHx:HTN, back sx, L foot drop.   Clinical Impression   Pt PTA: Pt living with spouse; working. Pt currently Pt limited by decreased strength and coordination in all 4 extremities, poor mobility and decreased sensation with poor ability to control transfers and bed mobility. Pt set-upA to maxA for ADL at this time.  Pt set-upA to maxA for ADL at this time. MaxA +2 assist  Transfer to recliner towards R side; 1 person in front blocking L knee and 1 therapist to L side. Pt would greatly benefit from continued OT skilled services. OT following acutely. Pt very motivated to return to PLOF and would be a great candidate for CIR. Cervical handout provided and reviewed ADL in detail. Pt educated on: clothing between brace, set an alarm at night for medication, avoid sitting for long periods of time, correct bed positioning for sleeping, correct sequence for bed mobility, avoiding lifting more than 5 pounds and never wash directly over incision. All education is complete and patient indicates understanding.      Follow Up Recommendations  CIR    Equipment Recommendations  3 in 1 bedside commode;Wheelchair (measurements OT);Wheelchair cushion (measurements OT)    Recommendations for Other Services Rehab consult     Precautions / Restrictions Precautions Precautions: Cervical;Fall Precaution Booklet Issued: Yes (comment) Precaution Comments: discussion of neck/cervical precautions; able to state with visual cues 3/3. Required Braces or Orthoses: Cervical Brace Cervical Brace: Hard collar;At all times Restrictions Weight Bearing  Restrictions: No      Mobility Bed Mobility Overal bed mobility: Needs Assistance Bed Mobility: Rolling;Sidelying to Sit Rolling: Mod assist Sidelying to sit: Max assist       General bed mobility comments: Pt impulsive at times and requires cues to slow down; pt modA for rolling BLEs to side; R arm using side rail and L arm guided to siderail. Pt maxA to totalA to push up with trunk and guide BLEs to EOB. Pt maxA to scoot hips to EOB. MaxA initially to steady self at EOB and then BUEs on bed for support supervisionA from them at EOB    Transfers Overall transfer level: Needs assistance Equipment used: 2 person hand held assist Transfers: Sit to/from [employer] Transfers Sit to Stand: Max assist;+2 physical assistance;+2 safety/equipment;From elevated surface Stand pivot transfers: Max assist;+2 physical assistance;+2 safety/equipment;From elevated surface       General transfer comment: Assist for L knee blocking and guidance to R for recliner    Balance Overall balance assessment: Needs assistance Sitting-balance support: Bilateral upper extremity supported;Feet supported Sitting balance-Leahy Scale: Fair     Standing balance support: Bilateral upper extremity supported Standing balance-Leahy Scale: Zero Standing balance comment: Pt requiring maxA to totalA to stand and pivot to recliner;LLE sliding and assisted to pivot; RLE weakness with any movement                           ADL either performed or assessed with clinical judgement   ADL Overall ADL's : Needs assistance/impaired Eating/Feeding: Set up;Sitting Eating/Feeding Details (indicate cue type and reason): using R hand; LUE to bring to mouth with assist Grooming: Min guard;Sitting   Upper Body Bathing: Minimal  assistance;Sitting   Lower Body Bathing: Maximal assistance;Sitting/lateral leans;Sit to/from stand   Upper Body Dressing : Moderate assistance;Sitting Upper Body Dressing Details  (indicate cue type and reason): requiring assist for applying C-collar Lower Body Dressing: Maximal assistance;Cueing for safety;Sitting/lateral leans;Sit to/from stand;+2 for physical assistance Lower Body Dressing Details (indicate cue type and reason): totalA for donning socks; pt could pull underwear from knees to waist Toilet Transfer: Maximal assistance;+2 for physical assistance;+2 for safety/equipment;Stand-pivot Toilet Transfer Details (indicate cue type and reason): +2 assist simulation to recliner towards R side; 1 person in front blocking L knee and 1 therapist to L side Toileting- Clothing Manipulation and Hygiene: Maximal assistance;+2 for physical assistance;+2 for safety/equipment;Sitting/lateral lean;Sit to/from stand;Cueing for safety Toileting - Clothing Manipulation Details (indicate cue type and reason): requiring assist     Functional mobility during ADLs: +2 for physical assistance;+2 for safety/equipment;Maximal assistance;Cueing for safety;Cueing for sequencing General ADL Comments: Pt limited by decreased strength and coordination in all 4 extremities, poor mobility and decreased sensation with poor ability to control transfers and bed mobility. Pt set-upA to maxA for ADL at this time.     Vision Baseline Vision/History: No visual deficits Patient Visual Report: No change from baseline Vision Assessment?: No apparent visual deficits     Perception     Praxis      Pertinent Vitals/Pain Pain Assessment: Faces Faces Pain Scale: Hurts little more Pain Location: neck, legs Pain Descriptors / Indicators: Discomfort;Sore;Spasm Pain Intervention(s): Monitored during session;Premedicated before session;Repositioned     Hand Dominance Right   Extremity/Trunk Assessment Upper Extremity Assessment Upper Extremity Assessment: Generalized weakness;RUE deficits/detail;LUE deficits/detail RUE Deficits / Details: 4-5/ strength shoulder through elbow; wrist and hand 3+/5;  AROM, WFLs RUE Sensation: decreased light touch RUE Coordination: decreased fine motor;decreased gross motor LUE Deficits / Details: strength 3-/5 MM grade at shoulder through elbow; wrist and hand 2/5 MM grade, poor grip strength LUE Sensation: decreased light touch LUE Coordination: decreased fine motor;decreased gross motor   Lower Extremity Assessment Lower Extremity Assessment: Defer to PT evaluation;RLE deficits/detail;LLE deficits/detail RLE Deficits / Details: Coordination and sensation deficits RLE Coordination: decreased fine motor;decreased gross motor LLE Deficits / Details: weakness and coordination deficits LLE Coordination: decreased fine motor;decreased gross motor   Cervical / Trunk Assessment Cervical / Trunk Assessment: Other exceptions Cervical / Trunk Exceptions: s/p cervical sx   Communication Communication Communication: No difficulties   Cognition Arousal/Alertness: Awake/alert Behavior During Therapy: Impulsive Overall Cognitive Status: Within Functional Limits for tasks assessed                                 General Comments: Pt requiring cues to slow down for safety, but pt able to follow all commands. Pt explained that he is very motivated to return to PLOF.   General Comments  Pt's spouse in room for session    Exercises     Shoulder Instructions      Home Living Family/patient expects to be discharged to:: Private residence Living Arrangements: Spouse/significant other Available Help at Discharge: Family;Available 24 hours/day Type of Home: House Home Access: Stairs to enter [employer] of Steps: 3 Entrance Stairs-Rails: Can reach both Home Layout: One level     Bathroom Shower/Tub: Producer, television/film/video: Handicapped height     Home Equipment: Environmental consultant - 2 wheels;Cane - single point   Additional Comments: "unsteady grab bar in shower"      Prior Functioning/Environment Level  of Independence:  Independent with assistive device(s)        Comments: use of SPC, slow gait and L foot drag        OT Problem List: Decreased strength;Decreased activity tolerance;Impaired balance (sitting and/or standing);Decreased coordination;Decreased safety awareness;Decreased knowledge of use of DME or AE;Decreased knowledge of precautions;Impaired UE functional use;Pain;Impaired sensation      OT Treatment/Interventions: Self-care/ADL training;Therapeutic exercise;Neuromuscular education;Energy conservation;DME and/or AE instruction;Therapeutic activities;Patient/family education;Balance training    OT Goals(Current goals can be found in the care plan section) Acute Rehab OT Goals Patient Stated Goal: to walk OT Goal Formulation: With patient Time For Goal Achievement: 08/24/20 Potential to Achieve Goals: Good ADL Goals Pt Will Perform Eating: with modified independence;with adaptive utensils;sitting Pt Will Perform Grooming: with modified independence;sitting Pt Will Perform Upper Body Dressing: with min assist;sitting Pt Will Transfer to Toilet: with mod assist;stand pivot transfer;bedside commode Pt/caregiver will Perform Home Exercise Program: Increased strength;Both right and left upper extremity;With written HEP provided;With Supervision Additional ADL Goal #1: Pt will progress to minA for bed mobility with <2 verbal cues for proper log roll to EOB.  OT Frequency: Min 2X/week   Barriers to D/C:            Co-evaluation              AM-PAC OT "6 Clicks" Daily Activity     Outcome Measure Help from another person eating meals?: A Little Help from another person taking care of personal grooming?: A Lot Help from another person toileting, which includes using toliet, bedpan, or urinal?: A Lot Help from another person bathing (including washing, rinsing, drying)?: A Lot Help from another person to put on and taking off regular upper body clothing?: A Lot Help from another  person to put on and taking off regular lower body clothing?: A Lot 6 Click Score: 13   End of Session Equipment Utilized During Treatment: Cervical collar;Gait belt Nurse Communication: Mobility status;Other (comment) (turn recliner to R side)  Activity Tolerance: Patient tolerated treatment well;Treatment limited secondary to medical complications (Comment) Patient left: in chair;with call bell/phone within reach;with family/visitor present  OT Visit Diagnosis: Unsteadiness on feet (R26.81);Muscle weakness (generalized) (M62.81);Pain;Hemiplegia and hemiparesis                Time: 6269-4854 OT Time Calculation (min): 49 min Charges:  OT General Charges $OT Visit: 1 Visit OT Evaluation $OT Eval Moderate Complexity: 1 Mod OT Treatments $Neuromuscular Re-education: 8-22 mins  Flora Lipps, OTR/L Acute Rehabilitation Services Pager: [phone] Office: [phone]   Kodey Xue C 08/10/2020, 9:31 AM

## 2020-08-10 NOTE — Progress Notes (Signed)
Continues to improve, wife at bedside. L grip now stronger at likely 4-/5, wrist ext 4-/5, biceps 4+/5B, triceps 4/5B, deltoids 4+/5 B, R grip 4+/5, L leg with much more mvmt, can now lift both legs off recliner, DF/PF 3/5 R 2/5 L, KE much better on L. Increased tone in legs, but he had that pre-op.   Much better. Continue decadron, PT/OT, collar, add baclofen for spasticity. Will need CIR, we are all pleased with his progress and remain encouraged

## 2020-08-11 NOTE — Progress Notes (Signed)
Physical Therapy Treatment Patient Details Name: Keith Downs MRN: [id number] DOB: [birth year] Today's Date: 08/11/2020    History of Present Illness Pt is a 53 male s/p arm pain, numbness and gait distubance. Pt is requiring decompressive anterior cervical discectomy C3-4;Decompressive anterior cervical corpectomy; Anterior cervical plating C3-6. PMHx:HTN, back sx, L foot drop.    PT Comments    Patient progressing slowly towards PT goals. Requires less assist to get to EOB today with cues for sequencing and assist for positioning of LLE prior to rolling. Able to sit EOB with Min A due to posterior bias and moments of Min guard assist once LEs are positioned well under BoS and extensor tone is broken. Requires Max A of 2 to stand from EOB with difficulty extending RLE during transition needing Max assist. Able to take a few steps to get to chair with max A of 2 for weight shifting, advancing Les and balance. Tolerated there ex in chair. Less impulsive today. Continues to be highly motivated. Continue to recommend CIR. Will follow.     Follow Up Recommendations  CIR;Supervision for mobility/OOB     Equipment Recommendations  Wheelchair (measurements PT);Wheelchair cushion (measurements PT)    Recommendations for Other Services       Precautions / Restrictions Precautions Precautions: Cervical;Fall Precaution Booklet Issued: Yes (comment) Required Braces or Orthoses: Cervical Brace Cervical Brace: Hard collar;At all times Restrictions Weight Bearing Restrictions: No    Mobility  Bed Mobility Overal bed mobility: Needs Assistance Bed Mobility: Rolling;Sidelying to Sit Rolling: Min assist Sidelying to sit: Mod assist       General bed mobility comments: Assist to position left knee into flexion prior to rolling, able to reach for rail, assist with LEs and to elevate trunk to get to EOB, posterior bias. Spasticity present LLE but able to break into flexion prior to  rolling. noted to have extensor tone RLE upon sitting EOB however able to position with assist.  Transfers Overall transfer level: Needs assistance Equipment used: 2 person hand held assist Transfers: Sit to/from [employer] Transfers Sit to Stand: Max assist;+2 physical assistance;+2 safety/equipment;From elevated surface Stand pivot transfers: Max assist;+2 physical assistance;+2 safety/equipment;From elevated surface;Mod assist       General transfer comment: Requires assist to power up from EOB with difficulty extending RLE upon standing requiring Max A; flexed at hips initially but able to get upright with cues. Able to take a few steps to get to chair with assist to advance RLE, with weight shifting and to advance LLE, poor awareness of LLE. MIld right knee instability but no buckling once upright.  Ambulation/Gait             General Gait Details: Deferred due to safety   Stairs             Wheelchair Mobility    Modified Rankin (Stroke Patients Only)       Balance Overall balance assessment: Needs assistance Sitting-balance support: Feet supported;Bilateral upper extremity supported Sitting balance-Leahy Scale: Poor Sitting balance - Comments: varying assist needed, moments of Min guard to Min A sitting EOB with BUE support; able to come forward using UEs on therapist and maintain. posterior bias esp when fatigued or with worsened spasticity. Postural control: Posterior lean Standing balance support: During functional activity Standing balance-Leahy Scale: Zero Standing balance comment: Requiers Mod-Max A o 2 for standing balance; able to activate extensor musculature for upright posture.  Cognition Arousal/Alertness: Awake/alert Behavior During Therapy: Impulsive Overall Cognitive Status: Within Functional Limits for tasks assessed                                 General Comments: Pt requiring  cues to slow down for safety, but pt able to follow all commands. Impulsivity improved today.      Exercises General Exercises - Lower Extremity Long Arc Quad: AROM;Both;10 reps;Seated Other Exercises Other Exercises: Performed chair push ups with emphasis on forward trunk lean and WB through BLEs x5    General Comments        Pertinent Vitals/Pain Pain Assessment: No/denies pain    Home Living                      Prior Function            PT Goals (current goals can now be found in the care plan section) Progress towards PT goals: Progressing toward goals    Frequency    Min 5X/week      PT Plan Current plan remains appropriate    Co-evaluation PT/OT/SLP Co-Evaluation/Treatment: Yes Reason for Co-Treatment: For patient/therapist safety;To address functional/ADL transfers;Complexity of the patient's impairments (multi-system involvement) PT goals addressed during session: Balance;Strengthening/ROM;Mobility/safety with mobility        AM-PAC PT "6 Clicks" Mobility   Outcome Measure  Help needed turning from your back to your side while in a flat bed without using bedrails?: A Lot Help needed moving from lying on your back to sitting on the side of a flat bed without using bedrails?: A Lot Help needed moving to and from a bed to a chair (including a wheelchair)?: A Lot Help needed standing up from a chair using your arms (e.g., wheelchair or bedside chair)?: Total Help needed to walk in hospital room?: Total Help needed climbing 3-5 steps with a railing? : Total 6 Click Score: 9    End of Session Equipment Utilized During Treatment: Gait belt;Cervical collar Activity Tolerance: Patient tolerated treatment well Patient left: in chair;with call bell/phone within reach;Other (comment) (with OT present) Nurse Communication: Mobility status PT Visit Diagnosis: Muscle weakness (generalized) (M62.81);Unsteadiness on feet (R26.81)     Time: 0177-9390 PT  Time Calculation (min) (ACUTE ONLY): 26 min  Charges:  $Therapeutic Activity: 8-22 mins                     Vale Haven, PT, DPT Acute Rehabilitation Services Pager [phone] Office [phone]       [name] 08/11/2020, 9:21 AM

## 2020-08-11 NOTE — Progress Notes (Signed)
Occupational Therapy Treatment Patient Details Name: Keith Downs MRN: [id number] DOB: [birth year] Today's Date: 08/11/2020    History of present illness Pt is a 22 male s/p arm pain, numbness and gait distubance. Pt is requiring decompressive anterior cervical discectomy C3-4;Decompressive anterior cervical corpectomy; Anterior cervical plating C3-6. PMHx:HTN, back sx, L foot drop.   OT comments  Pt mod+2 to EOB with smoother movements after PT assisting with spasticity in BLEs. Pt modA for reaching LUE to hand rail and modA overall for BLEs and trunk elevation. Pt sit to stand with maxA +2 and for transfers from bed to recliner. Pt with R knee blocked and L knee attempting to be blocked, but appears to hyperextend with movements. LUE HEP for elbow, wrist and hand ROM and finger opposition in LUE. Pt would benefit from continued OT skilled services. OT following acutely.    Follow Up Recommendations  CIR    Equipment Recommendations  3 in 1 bedside commode;Wheelchair (measurements OT);Wheelchair cushion (measurements OT)    Recommendations for Other Services Rehab consult    Precautions / Restrictions Precautions Precautions: Cervical;Fall Precaution Booklet Issued: Yes (comment) Required Braces or Orthoses: Cervical Brace Cervical Brace: Hard collar;At all times Restrictions Weight Bearing Restrictions: No       Mobility Bed Mobility Overal bed mobility: Needs Assistance Bed Mobility: Rolling;Sidelying to Sit Rolling: Min assist Sidelying to sit: Mod assist       General bed mobility comments: Assist to reach LUE to rail and sequencing cues required for log roll, but much smoother today. Pt able to perform rolling with increased spasticity in BLEs-  Transfers Overall transfer level: Needs assistance Equipment used: 2 person hand held assist Transfers: Sit to/from [employer] Sit to Stand: Max assist;+2 physical assistance;+2 safety/equipment;From  elevated surface Stand pivot transfers: Max assist;+2 physical assistance;+2 safety/equipment;From elevated surface;Mod assist       General transfer comment: R knee blocked; L knee hyperextending and slidign on floor; pain increased w    Balance Overall balance assessment: Needs assistance Sitting-balance support: Feet supported;Bilateral upper extremity supported Sitting balance-Leahy Scale: Poor Sitting balance - Comments: supervisionA to minguardA for sitting EOB or unsupported in recliner Postural control: Posterior lean Standing balance support: During functional activity Standing balance-Leahy Scale: Zero Standing balance comment: requires modA to maxA for stand pivot                           ADL either performed or assessed with clinical judgement   ADL Overall ADL's : Needs assistance/impaired Eating/Feeding: Set up;Sitting Eating/Feeding Details (indicate cue type and reason): pt able to hold cup in L hand to bring to mouth with straw                     Toilet Transfer: Maximal assistance;+2 for physical assistance;+2 for safety/equipment;Stand-pivot Toilet Transfer Details (indicate cue type and reason): +2 assist simulation to recliner towards R side; PT blocking R knee and 1 OT blocking L knee, but pt hyperextending L knee with wt bear and movement                 Vision   Vision Assessment?: No apparent visual deficits   Perception     Praxis      Cognition Arousal/Alertness: Awake/alert Behavior During Therapy: WFL for tasks assessed/performed Overall Cognitive Status: Within Functional Limits for tasks assessed  General Comments: Pt following all commands; no impulsivity noted today        Exercises Exercises: Other exercises General Exercises - Lower Extremity Long Arc Quad: AROM;Both;10 reps;Seated Other Exercises Other Exercises: BUE elbow, wrist and hand/finger opposition x10  reps   Shoulder Instructions       General Comments      Pertinent Vitals/ Pain       Pain Assessment: Faces Faces Pain Scale: Hurts little more Pain Location: neck, legs Pain Descriptors / Indicators: Discomfort;Sore;Spasm Pain Intervention(s): Monitored during session  Home Living                                          Prior Functioning/Environment              Frequency  Min 2X/week        Progress Toward Goals  OT Goals(current goals can now be found in the care plan section)  Progress towards OT goals: Progressing toward goals  Acute Rehab OT Goals Patient Stated Goal: to walk OT Goal Formulation: With patient Time For Goal Achievement: 08/24/20 Potential to Achieve Goals: Good ADL Goals Pt Will Perform Eating: with modified independence;with adaptive utensils;sitting Pt Will Perform Grooming: with modified independence;sitting Pt Will Perform Upper Body Dressing: with min assist;sitting Pt Will Transfer to Toilet: with mod assist;stand pivot transfer;bedside commode Pt/caregiver will Perform Home Exercise Program: Increased strength;Both right and left upper extremity;With written HEP provided;With Supervision Additional ADL Goal #1: Pt will progress to minA for bed mobility with <2 verbal cues for proper log roll to EOB.  Plan Discharge plan remains appropriate    Co-evaluation    PT/OT/SLP Co-Evaluation/Treatment: Yes Reason for Co-Treatment: Complexity of the patient's impairments (multi-system involvement);To address functional/ADL transfers;For patient/therapist safety PT goals addressed during session: Balance;Strengthening/ROM;Mobility/safety with mobility OT goals addressed during session: ADL's and self-care;Strengthening/ROM      AM-PAC OT "6 Clicks" Daily Activity     Outcome Measure   Help from another person eating meals?: A Little Help from another person taking care of personal grooming?: A Little Help from  another person toileting, which includes using toliet, bedpan, or urinal?: A Lot Help from another person bathing (including washing, rinsing, drying)?: A Lot Help from another person to put on and taking off regular upper body clothing?: A Lot Help from another person to put on and taking off regular lower body clothing?: A Lot 6 Click Score: 14    End of Session Equipment Utilized During Treatment: Cervical collar;Gait belt  OT Visit Diagnosis: Unsteadiness on feet (R26.81);Muscle weakness (generalized) (M62.81);Pain;Hemiplegia and hemiparesis Hemiplegia - Right/Left: Left Hemiplegia - dominant/non-dominant: Non-Dominant Hemiplegia - caused by: Other cerebrovascular disease Pain - part of body:  (neck)   Activity Tolerance Patient tolerated treatment well;Treatment limited secondary to medical complications (Comment)   Patient Left in chair;with call bell/phone within reach   Nurse Communication Mobility status        Time: 0700-0750 OT Time Calculation (min): 50 min  Charges: OT General Charges $OT Visit: 1 Visit OT Treatments $Neuromuscular Re-education: 8-22 mins  Flora Lipps, OTR/L Acute Rehabilitation Services Pager: [phone] Office: [phone]   Quashaun Lazalde C 08/11/2020, 11:15 AM

## 2020-08-11 NOTE — Progress Notes (Signed)
Subjective: Patient reports some improvement in strength  Objective: Vital signs in last 24 hours: Temp:  [97.5 F (36.4 C)-98.5 F (36.9 C)] 97.6 F (36.4 C) (01/16 0747) Pulse Rate:  [71-98] 84 (01/16 0747) Resp:  [17-20] 17 (01/16 0747) BP: (120-139)/(74-81) 136/81 (01/16 0747) SpO2:  [96 %-99 %] 97 % (01/16 0747)  Intake/Output from previous day: 01/15 0701 - 01/16 0700 In: -  Out: 5400 [Urine:5400] Intake/Output this shift: Total I/O In: -  Out: 1000 [Urine:1000]  C-collar in place Sitting in chair Dressing intact 3/5 L HF, 2/5 R HF.  3/5 HG bilaterally, 4/5 proximal strength in UEs  Lab Results: Recent Labs    08/09/20 0722  WBC 8.6  HGB 14.8  HCT 43.7  PLT 187   BMET Recent Labs    08/09/20 0722  NA 141  K 4.6  CL 104  CO2 26  GLUCOSE 104*  BUN 17  CREATININE 1.03  CALCIUM 9.5    Studies/Results: DG Cervical Spine 2-3 Views  Result Date: 08/09/2020 CLINICAL DATA:  C5 corpectomy with anterior fusion EXAM: CERVICAL SPINE - 2-3 VIEW; DG C-ARM 1-60 MIN COMPARISON:  Cervical spine radiograph July 04, 2020 FLUOROSCOPY TIME:  0 minutes 44 seconds; 4.80 mGy; 2 acquired images FINDINGS: Frontal and lateral views obtained. Evidence of corpectomy at C5. Anterior screw and plate fixation from C3-C6 with pedicle screws in the anterior C3, C4, and C6 vertebral bodies. Disc spacers noted at C3-4, C4-5, and C5-6. No fracture or spondylolisthesis. IMPRESSION: Postoperative anterior screw and plate fixation from C3-C6 with support hardware intact. Evident corpectomy at C5. No acute fracture or spondylolisthesis. Electronically Signed   By: Bretta Bang III M.D.   On: 08/09/2020 13:58   MR CERVICAL SPINE WO CONTRAST  Result Date: 08/09/2020 CLINICAL DATA:  Post ACDF.  Bilateral leg weakness EXAM: MRI CERVICAL SPINE WITHOUT CONTRAST TECHNIQUE: Multiplanar, multisequence MR imaging of the cervical spine was performed. No intravenous contrast was administered.  COMPARISON:  MRI cervical spine 07/02/2020. FINDINGS: Alignment: Normal alignment with straightening of the cervical lordosis. Vertebrae: Negative for fracture Hardware: ACDF C3 through C6 with anterior plate and screws. Corpectomy at C5 with bone graft in the corpectomy site. Cord: There is significant cord flattening due to spinal stenosis at C3 and C4. There is linear cord hyperintensity at the C4 and C5 levels. Previously there was severe cord compression and cord hyperintensity maximally at C4-5 but also at C3-4 and C5-6. No epidural hematoma identified. Posterior Fossa, vertebral arteries, paraspinal tissues: Mild prevertebral edema throughout the cervical spine extending from C2 through T2. Disc levels: C2-3: Mild disc degeneration.  Mild spinal stenosis C3-4: ACDF. Diffuse uncinate spurring with moderate spinal stenosis. Cord flattening with cord hyperintensity. Moderate foraminal stenosis bilaterally due to spurring. Interval improvement since the prior study. C4-5: ACDF. Diffuse spurring of the uncinate process. Moderate spinal stenosis with cord flattening and cord hyperintensity. Improvement in spinal stenosis and cord compression. Moderate foraminal stenosis bilaterally due to uncinate spurring. C5-6: C5 corpectomy. C5-6 ACDF. Diffuse uncinate spurring. Cord flattening and mild cord hyperintensity. Spinal stenosis has improved. Mild foraminal stenosis bilaterally. C6-7: Disc degeneration with asymmetric uncinate spurring on the right. Cord flattening with mild spinal stenosis unchanged. Moderate right foraminal stenosis due to spurring unchanged. C7-T1: Negative for stenosis. There appears to be partial fusion of C7 and T1 vertebral bodies. IMPRESSION: 1. Postop ACDF C3 through C6.  Corpectomy at C5 with bone graft. 2. Interval decompression of the spinal canal with improvement in spinal stenosis  as described above. Previously there was severe spinal stenosis and cord compression C3 through C6 most  severe at C4-5. There is improved spinal stenosis with residual cord flattening and cord hyperintensity at C4-5 and C5-6. 3. Right-sided spurring at C6-7 with mild spinal stenosis and moderate right foraminal stenosis unchanged. 4. Negative for epidural hematoma. Mild prevertebral soft tissue edema likely related to recent surgery. Electronically Signed   By: Marlan Palau M.D.   On: 08/09/2020 16:11   DG C-Arm 1-60 Min  Result Date: 08/09/2020 CLINICAL DATA:  C5 corpectomy with anterior fusion EXAM: CERVICAL SPINE - 2-3 VIEW; DG C-ARM 1-60 MIN COMPARISON:  Cervical spine radiograph July 04, 2020 FLUOROSCOPY TIME:  0 minutes 44 seconds; 4.80 mGy; 2 acquired images FINDINGS: Frontal and lateral views obtained. Evidence of corpectomy at C5. Anterior screw and plate fixation from C3-C6 with pedicle screws in the anterior C3, C4, and C6 vertebral bodies. Disc spacers noted at C3-4, C4-5, and C5-6. No fracture or spondylolisthesis. IMPRESSION: Postoperative anterior screw and plate fixation from C3-C6 with support hardware intact. Evident corpectomy at C5. No acute fracture or spondylolisthesis. Electronically Signed   By: Bretta Bang III M.D.   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

## 2020-08-11 NOTE — PMR Pre-admission (Signed)
PMR Admission Coordinator Pre-Admission Assessment  Patient: Keith Downs is an 60 y.o., male MRN: [id number] DOB: Dec 22, 1960 Height: 5\' 11"  (180.3 cm) Weight: 88 kg  Insurance Information HMO:     PPO: yes     PCP:      IPA:      80/20:      OTHER:  PRIMARY: [employer] P[employer]      Policy#: [id number]      Subscriber: Pt    CM Name: Caryl Pina      Phone#: [phone]     Fax#: [phone] Pre-Cert#: [id number] approved for 7 days      Employer: [employer] Date: 05/27/2021 - still active Deductible: $150 ($0 met) OOP Max: $1,000 ($20 met) CIR: 90% coverage, 10% co-insurance SNF: 90% coverage, 10% co-insurance Outpatient:$20 copay per visit; limited to 60 combined rehabilitative visits Home Health: 90% coverage, 10% co-insurance DME: 90% coverage, 10% co-insurance Providers: in network SECONDARY: none     Policy#:      Phone#:  Development worker, community:      Phone#:   The Engineer, petroleum" for patients in Inpatient Rehabilitation Facilities with attached "Privacy Act [place] Records" was provided and verbally reviewed with: Patient  Emergency Contact Information Contact Information    Name Relation Home Work [place] Spouse   [phone]      Current Medical History  Patient Admitting Diagnosis: Cervical Myelopathy, s/p anterior cervical fusion  History of Present Illness: Pt is a 27  male with PMH significant for hypertension, previous back surgery that resulted in L foot drop who was admitted with arm pain, numbness,  and gait distubance. Pt underwent decompressive anterior cervical discectomy C3-4 , ecompressive anterior cervical corpectomy,  anterior cervical plating C3-6 by neurosurgery on  08/10/19. Following surgery, Pt. Reports he is weaker that prior to surgery with minimal movement in his L lower extremity. Referral to CIR was made to assist Pt. In returning to PLOF.   Patient's medical  record from [facility]  has been reviewed by the rehabilitation admission coordinator and physician.  Past Medical History  Past Medical History:  Diagnosis Date  . Ambulates with cane   . Anemia   . Arthritis    back, knee  . Asthma   . BPH (benign prostatic hyperplasia)   . ED (erectile dysfunction)   . Foot drop, left    some in right foot  . GERD (gastroesophageal reflux disease)   . Headache   . History of hiatal hernia    patient denied this dx  . HLD (hyperlipidemia)   . Hypertension   . Hypothyroidism   . Neuromuscular disorder (HCC)    numbness in right hand  . Peripheral vascular disease ([place])   . Seasonal allergies     Family History   family history is not on file.  Prior Rehab/Hospitalizations Has the patient had prior rehab or hospitalizations prior to admission? Yes  Has the patient had major surgery during 100 days prior to admission? Yes   Current Medications  Current Facility-Administered Medications:  .  0.9 % NaCl with KCl 20 mEq/ L  infusion, , Intravenous, Continuous, Eustace Moore, MD, Last Rate: 100 mL/hr at 08/11/20 0135, New Bag at 08/11/20 0135 .  acetaminophen (TYLENOL) tablet 650 mg, 650 mg, Oral, Q4H PRN, 650 mg at 08/10/20 1943 **OR** acetaminophen (TYLENOL) suppository 650 mg, 650 mg, Rectal, Q4H PRN, Eustace Moore, MD .  albuterol (VENTOLIN  HFA) 108 (90 Base) MCG/ACT inhaler 2 puff, 2 puff, Inhalation, Q6H PRN, Tia Alert, MD .  baclofen (LIORESAL) tablet 5 mg, 5 mg, Oral, TID, Tia Alert, MD, 5 mg at 08/11/20 1000 .  Chlorhexidine Gluconate Cloth 2 % PADS 6 each, 6 each, Topical, Daily, Tia Alert, MD, 6 each at 08/11/20 [phone] .  dexamethasone (DECADRON) injection 4 mg, 4 mg, Intravenous, Q6H **OR** dexamethasone (DECADRON) tablet 6 mg, 6 mg, Oral, Q6H, Tia Alert, MD, 6 mg at 08/11/20 0532 .  gabapentin (NEURONTIN) capsule 300 mg, 300 mg, Oral, TID, Tia Alert, MD, 300 mg at 08/11/20 1000 .  irbesartan (AVAPRO)  tablet 150 mg, 150 mg, Oral, Daily, 150 mg at 08/11/20 0953 **AND** hydrochlorothiazide (MICROZIDE) capsule 12.5 mg, 12.5 mg, Oral, Daily, Tia Alert, MD, 12.5 mg at 08/11/20 1000 .  levothyroxine (SYNTHROID) tablet 125 mcg, 125 mcg, Oral, Q0600, Tia Alert, MD, 125 mcg at 08/11/20 0531 .  menthol-cetylpyridinium (CEPACOL) lozenge 3 mg, 1 lozenge, Oral, PRN **OR** phenol (CHLORASEPTIC) mouth spray 1 spray, 1 spray, Mouth/Throat, PRN, Tia Alert, MD .  mometasone-formoterol [facility]) 100-5 MCG/ACT inhaler 2 puff, 2 puff, Inhalation, BID, Tia Alert, MD, 2 puff at 08/11/20 [phone] .  morphine 2 MG/ML injection 2 mg, 2 mg, Intravenous, Q2H PRN, Tia Alert, MD .  multivitamin with minerals tablet 1 tablet, 1 tablet, Oral, Daily, Tia Alert, MD, 1 tablet at 08/11/20 1000 .  ondansetron (ZOFRAN) tablet 4 mg, 4 mg, Oral, Q6H PRN **OR** ondansetron (ZOFRAN) injection 4 mg, 4 mg, Intravenous, Q6H PRN, Tia Alert, MD .  oxyCODONE (Oxy IR/ROXICODONE) immediate release tablet 5 mg, 5 mg, Oral, Q3H PRN, Tia Alert, MD, 5 mg at 08/11/20 1000 .  senna (SENOKOT) tablet 8.6 mg, 1 tablet, Oral, BID, Tia Alert, MD, 8.6 mg at 08/11/20 1000 .  sodium chloride flush (NS) 0.9 % injection 3 mL, 3 mL, Intravenous, Q12H, Tia Alert, MD, 3 mL at 08/11/20 1001 .  sodium chloride flush (NS) 0.9 % injection 3 mL, 3 mL, Intravenous, PRN, Tia Alert, MD  Patients Current Diet:  Diet Order            Diet regular Room service appropriate? Yes; Fluid consistency: Thin  Diet effective now                 Precautions / Restrictions Precautions Precautions: Cervical,Fall Precaution Booklet Issued: Yes (comment) Precaution Comments: discussion of neck/cervical precautions; able to state with visual cues 3/3. Cervical Brace: Hard collar,At all times Restrictions Weight Bearing Restrictions: No   Has the patient had 2 or more falls or a fall with injury in the past year? Yes  Prior  Activity Level Community (5-7x/wk): Pt. was avtive in the community, working PTA  Prior Functional Level Self Care: Did the patient need help bathing, dressing, using the toilet or eating? Independent  Indoor Mobility: Did the patient need assistance with walking from room to room (with or without device)? Independent  Stairs: Did the patient need assistance with internal or external stairs (with or without device)? Needed some help  Functional Cognition: Did the patient need help planning regular tasks such as shopping or remembering to take medications? Independent  Home Assistive Devices / Equipment Home Assistive Devices/Equipment: Cane (specify quad or straight),C-collar Home Equipment: Walker - 2 wheels,Cane - single point  Prior Device Use: Indicate devices/aids used by the patient prior to current illness, exacerbation or injury?  None of the above  Current Functional Level Cognition  Overall Cognitive Status: Within Functional Limits for tasks assessed Orientation Level: Oriented X4 General Comments: Pt following all commands; no impulsivity noted today    Extremity Assessment (includes Sensation/Coordination)  Upper Extremity Assessment: Generalized weakness RUE Deficits / Details: 4-5/ strength shoulder through elbow; wrist and hand 3+/5; AROM, WFLs RUE Sensation: decreased light touch RUE Coordination: decreased fine motor,decreased gross motor LUE Deficits / Details: strength 3+/5 MM grade at shoulder through elbow; wrist and hand 2+/5 MM grade, fair grip strength LUE Sensation: decreased light touch LUE Coordination: decreased fine motor,decreased gross motor  Lower Extremity Assessment: Defer to PT evaluation,LLE deficits/detail RLE Deficits / Details: Coordination and sensation deficits RLE:  (Grossly 3+/5 . Tends to move in synergy patterns, but is able to isolate when focused) RLE Sensation: decreased proprioception RLE Coordination: decreased gross motor LLE  Deficits / Details: LLE hyperextending today in standing LLE:  (Moves in synergistic pattern and has more difficulty isolating muscles than he does on the RLE) LLE Sensation: decreased proprioception LLE Coordination: decreased gross motor    ADLs  Overall ADL's : Needs assistance/impaired Eating/Feeding: Set up,Sitting Eating/Feeding Details (indicate cue type and reason): pt able to hold cup in L hand to bring to mouth with straw Grooming: Min guard,Sitting Upper Body Bathing: Minimal assistance,Sitting Lower Body Bathing: Maximal assistance,Sitting/lateral leans,Sit to/from stand Upper Body Dressing : Moderate assistance,Sitting Upper Body Dressing Details (indicate cue type and reason): requiring assist for applying C-collar Lower Body Dressing: Maximal assistance,Cueing for safety,Sitting/lateral leans,Sit to/from stand,+2 for physical assistance Lower Body Dressing Details (indicate cue type and reason): totalA for donning socks; pt could pull underwear from knees to waist Toilet Transfer: Maximal assistance,+2 for physical assistance,+2 for safety/equipment,Stand-pivot Toilet Transfer Details (indicate cue type and reason): +2 assist simulation to recliner towards R side; PT blocking R knee and 1 OT blocking L knee, but pt hyperextending L knee with wt bear and movement Toileting- Clothing Manipulation and Hygiene: Maximal assistance,+2 for physical assistance,+2 for safety/equipment,Sitting/lateral lean,Sit to/from stand,Cueing for safety Toileting - Clothing Manipulation Details (indicate cue type and reason): requiring assist Functional mobility during ADLs: +2 for physical assistance,+2 for safety/equipment,Maximal assistance,Cueing for safety,Cueing for sequencing General ADL Comments: Pt limited by decreased strength and coordination in all 4 extremities, poor mobility and decreased sensation with poor ability to control transfers and bed mobility. Pt set-upA to maxA for ADL at this  time.    Mobility  Overal bed mobility: Needs Assistance Bed Mobility: Rolling,Sidelying to Sit Rolling: Min assist Sidelying to sit: Mod assist General bed mobility comments: Assist to reach LUE to rail and sequencing cues required for log roll, but much smoother today. Pt able to perform rolling with increased spasticity in BLEs-    Transfers  Overall transfer level: Needs assistance Equipment used: 2 person hand held assist Transfers: Sit to/from [employer] Sit to Stand: Max assist,+2 physical assistance,+2 safety/equipment,From elevated surface Stand pivot transfers: Max assist,+2 physical assistance,+2 safety/equipment,From elevated surface,Mod assist General transfer comment: R knee blocked; L knee hyperextending and slidign on floor; pain increased w    Ambulation / Gait / Stairs / Wheelchair Mobility  Ambulation/Gait Ambulation/Gait assistance:  (Unable today due to lower extremity weakness) General Gait Details: Deferred due to safety    Posture / Balance Dynamic Sitting Balance Sitting balance - Comments: supervisionA to minguardA for sitting EOB or unsupported in recliner Balance Overall balance assessment: Needs assistance Sitting-balance support: Feet supported,Bilateral upper extremity supported Sitting balance-Leahy  Scale: Poor Sitting balance - Comments: supervisionA to minguardA for sitting EOB or unsupported in recliner Postural control: Posterior lean Standing balance support: During functional activity Standing balance-Leahy Scale: Zero Standing balance comment: requires modA to maxA for stand pivot    Special needs/care consideration Special service needs Cervical Brace, Cervical Collar   Previous Home Environment  Living Arrangements: Spouse/significant other Available Help at Discharge: Family,Available 24 hours/day Type of Home: House Home Layout: One level Home Access: Stairs to enter Entrance Stairs-Rails: Can reach both Entrance  Stairs-Number of Steps: 3 Bathroom Shower/Tub: Multimedia programmer: Handicapped height Bathroom Accessibility: Yes How Accessible: Accessible via walker [place]: No Additional Comments: "unsteady grab bar in shower"  Discharge Living Setting Plans for Discharge Living Setting: Patient's home Type of Home at Discharge: House Discharge Home Layout: One level Discharge Home Access: Stairs to enter Entrance Stairs-Rails: Can reach both Entrance Stairs-Number of Steps: 3 Discharge Bathroom Shower/Tub: Walk-in shower Discharge Bathroom Toilet: Handicapped height Discharge Bathroom Accessibility: Yes How Accessible: Accessible via walker Does the patient have any problems obtaining your medications?: No  Social/Family/Support Systems Patient Roles: Spouse Contact Information: [phone] Anticipated Caregiver: Lucio Litsey Anticipated Caregiver's Contact Information: [phone] Ability/Limitations of Caregiver: Can provide Mod A Caregiver Availability: 24/7 Discharge Plan Discussed with Primary Caregiver: Yes Is Caregiver In Agreement with Plan?: Yes Does Caregiver/Family have Issues with Lodging/Transportation while Pt is in Rehab?: No  Patient is from [place], [place] so wife will arrive prior to patient discharge or stay in hotel   Goals Patient/Family Goal for Rehab: PT/OT MIn A Expected length of stay: 14-18 days Pt/Family Agrees to Admission and willing to participate: Yes Program Orientation Provided & Reviewed with Pt/Caregiver Including Roles  & Responsibilities: Yes  Decrease burden of Care through IP rehab admission: n/a  Possible need for SNF placement upon discharge: not anticipated  Patient Condition: I have reviewed medical records from [facility], spoken with CM, and patient. I met with patient at the bedside and discussed via phone for inpatient rehabilitation assessment.  Patient will benefit from ongoing PT and OT,  can actively participate in 3 hours of therapy a day 5 days of the week, and can make measurable gains during the admission.  Patient will also benefit from the coordinated team approach during an Inpatient Acute Rehabilitation admission.  The patient will receive intensive therapy as well as Rehabilitation physician, nursing, social worker, and care management interventions.  Due to safety, skin/wound care, disease management, medication administration, pain management and patient education the patient requires 24 hour a day rehabilitation nursing.  The patient is currently mod A to max+2 mod A** with mobility and basic ADLs.  Discharge setting and therapy post discharge at home with home health is anticipated.  Patient has agreed to participate in the Acute Inpatient Rehabilitation Program and will admit today.  Preadmission Screen Completed By:  Clemens Catholic with updates by Danne Baxter RN MSN, 08/14/2020 1056 ______________________________________________________________________   Discussed status with Dr. Posey Pronto  on  08/14/2020 at  1056 and received approval for admission today.  Admission Coordinator: Clemens Catholic with updates by  Danne Baxter RN MSN, time  1056 Date  08/14/2020   Assessment/Plan: Diagnosis: Cervical Myelopathy  1. Does the need for close, 24 hr/day Medical supervision in concert with the patient's rehab needs make it unreasonable for this patient to be served in a less intensive setting? Yes 2. Co-Morbidities requiring supervision/potential complications: HTN (monitor and provide prns in accordance with increased physical  exertion and pain), previous back surgery that resulted in L foot drop who was admitted with arm pain, numbness,  and gait distubance 3. Due to bladder management, bowel management, safety, skin/wound care, disease management, pain management and patient education, does the patient require 24 hr/day rehab nursing? Yes 4. Does the patient require coordinated  care of a physician, rehab nurse, PT, OT to address physical and functional deficits in the context of the above medical diagnosis(es)? Yes Addressing deficits in the following areas: balance, endurance, locomotion, strength, transferring, bathing, dressing, toileting and psychosocial support 5. Can the patient actively participate in an intensive therapy program of at least 3 hrs of therapy 5 days a week? Yes 6. The potential for patient to make measurable gains while on inpatient rehab is excellent 7. Anticipated functional outcomes upon discharge from inpatient rehab: supervision and min assist PT, supervision and min assist OT, n/a SLP 8. Estimated rehab length of stay to reach the above functional goals is: 13-17 days. 9. Anticipated discharge destination: Home 10. Overall Rehab/Functional Prognosis: good   MD Signature: Delice Lesch, MD, ABPMR

## 2020-08-12 MED ORDER — DEXAMETHASONE 4 MG PO TABS
2.0000 mg | ORAL_TABLET | Freq: Four times a day (QID) | ORAL | Status: DC
  Administered 2020-08-12 – 2020-08-13 (×5): 2 mg via ORAL
  Filled 2020-08-12 (×4): qty 1

## 2020-08-12 MED ORDER — DEXAMETHASONE SODIUM PHOSPHATE 4 MG/ML IJ SOLN: 2.0000 mg | Freq: Four times a day (QID) | INTRAMUSCULAR | Status: DC

## 2020-08-12 NOTE — Progress Notes (Signed)
Subjective: Patient reports walked with PT today!! No pain, swallowing well  Objective: Vital signs in last 24 hours: Temp:  [97.6 F (36.4 C)-98.4 F (36.9 C)] 97.6 F (36.4 C) (01/17 0807) Pulse Rate:  [68-79] 73 (01/17 0807) Resp:  [17-20] 17 (01/17 0807) BP: (122-142)/(80-87) 139/83 (01/17 0807) SpO2:  [96 %-99 %] 97 % (01/17 0807)  Intake/Output from previous day: 01/16 0701 - 01/17 0700 In: -  Out: 4600 [Urine:4600] Intake/Output this shift: Total I/O In: -  Out: 1200 [Urine:1200]  Grip slightly better, ataxic in UEs but strength is 4/5 or better, using his phone, using utensils, LEs very hypertonic and DF/PF weak on L  Lab Results: Lab Results  Component Value Date   WBC 8.6 08/09/2020   HGB 14.8 08/09/2020   HCT 43.7 08/09/2020   MCV 88.1 08/09/2020   PLT 187 08/09/2020   Lab Results  Component Value Date   INR 1.0 08/09/2020   BMET Lab Results  Component Value Date   NA 141 08/09/2020   K 4.6 08/09/2020   CL 104 08/09/2020   CO2 26 08/09/2020   GLUCOSE 104 (H) 08/09/2020   BUN 17 08/09/2020   CREATININE 1.03 08/09/2020   CALCIUM 9.5 08/09/2020    Studies/Results: No results found.  Assessment/Plan: Better, remove foley, await CIR  Estimated body mass index is 27.06 kg/m as calculated from the following:   Height as of this encounter: 5\' 11"  (1.803 m).   Weight as of this encounter: 88 kg.    LOS: 3 days    08/12/2020, 11:13 AM

## 2020-08-12 NOTE — Progress Notes (Signed)
Inpatient Rehab Admissions Coordinator:   I spoke with Pt. Over the phone to notify him that I am opening a case with his insurance today, but do not yet have authorization for CIR admission. I do not have a bed for Pt. Today but will follow for potential admit later this week.   Megan Salon, MS, CCC-SLP Rehab Admissions Coordinator  [phone] (celll) [phone] (office)

## 2020-08-12 NOTE — Progress Notes (Signed)
Occupational Therapy Treatment Patient Details Name: Keith Downs MRN: [id number] DOB: 31-Aug-1960 Today's Date: 08/12/2020    History of present illness Pt is a 80 male s/p arm pain, numbness and gait distubance. Pt is requiring decompressive anterior cervical discectomy C3-4;Decompressive anterior cervical corpectomy; Anterior cervical plating C3-6. PMHx:HTN, back sx, L foot drop.   OT comments  Pt progressing well with BUE HEP. Pt continues to be limited by tone and spasticity in LLE, but able to bear weight and less hyperextension noted today; EVA walker assisting with pt standing upright. Pt continues to require assist with UB and LB ADL. Pt performing sit to stand x2 times with therapist on R and then L side; and then x2 times with EVA walker in front t to support upright posture once standing. Pt continues to require maxA (+2) not available, but safest with +2. Pt would benefit from continued OT skilled services. OT following acutely.   Follow Up Recommendations  CIR    Equipment Recommendations  3 in 1 bedside commode;Wheelchair (measurements OT);Wheelchair cushion (measurements OT)    Recommendations for Other Services Rehab consult    Precautions / Restrictions Precautions Precautions: Cervical;Fall Precaution Booklet Issued: Yes (comment) Precaution Comments: discussion of neck/cervical precautions; able to state with visual cues 3/3. Required Braces or Orthoses: Cervical Brace Cervical Brace: Hard collar (when up) Restrictions Weight Bearing Restrictions: No       Mobility Bed Mobility               General bed mobility comments: pt in recliner pre and post session  Transfers Overall transfer level: Needs assistance Equipment used: 1 person hand held assist;2 person hand held assist Transfers: Sit to/from Stand Sit to Stand: Max assist (x2 times with therapist on R and then L side; pt did the best with EVA walker in front of pt to support upright  posture once standing)         General transfer comment: R knee blocked and L knee blocked based on OTR position; no steps taken    Balance Overall balance assessment: Needs assistance Sitting-balance support: Feet supported;Bilateral upper extremity supported Sitting balance-Leahy Scale: Poor Sitting balance - Comments: Pt sitting edge of recliner with  BUE supports   Standing balance support: During functional activity Standing balance-Leahy Scale: Zero Standing balance comment: dep on EVA walker/external support                           ADL either performed or assessed with clinical judgement   ADL Overall ADL's : Needs assistance/impaired Eating/Feeding: Set up;Sitting Eating/Feeding Details (indicate cue type and reason): pt able to hold cup in L hand to bring to mouth with straw                                 Functional mobility during ADLs: Maximal assistance;Cueing for safety;Cueing for sequencing (Sit to stand with +1 therapist on R side and then L side; pt stood the best with OTR on R side and EVA walker in front. pt standing for x1 mins then x2 mins with EVA walker) General ADL Comments: pt continues to be limited by tone and spasticity in LLE, but able to bear weight and less hyperextension noted today; EVA walker assisting with pt standing upright. Pt continues to require assist with UB and LB ADL.     Vision   Vision Assessment?: No apparent  visual deficits   Perception     Praxis      Cognition Arousal/Alertness: Awake/alert Behavior During Therapy: WFL for tasks assessed/performed Overall Cognitive Status: Within Functional Limits for tasks assessed                                 General Comments: Pt eager to progress, but easily calmed down with verbal cues        Exercises Exercises: Other exercises Other Exercises Other Exercises: finger to thumb opposition Other Exercises: shoulder flex, elbow through  digit flex/ext; FA pronation/supination and hand squeeze   Shoulder Instructions       General Comments incision with honecomb dressing over it    Pertinent Vitals/ Pain       Pain Assessment: Faces Faces Pain Scale: Hurts little more Pain Location: neck and legs Pain Descriptors / Indicators: Discomfort;Sore;Spasm Pain Intervention(s): Limited activity within patient's tolerance  Home Living                                          Prior Functioning/Environment              Frequency  Min 2X/week        Progress Toward Goals  OT Goals(current goals can now be found in the care plan section)  Progress towards OT goals: Progressing toward goals  Acute Rehab OT Goals Patient Stated Goal: to walk OT Goal Formulation: With patient Time For Goal Achievement: 08/24/20 Potential to Achieve Goals: Good ADL Goals Pt Will Perform Eating: with modified independence;with adaptive utensils;sitting Pt Will Perform Grooming: with modified independence;sitting Pt Will Perform Upper Body Dressing: with min assist;sitting Pt Will Transfer to Toilet: with mod assist;stand pivot transfer;bedside commode Pt/caregiver will Perform Home Exercise Program: Increased strength;Both right and left upper extremity;With written HEP provided;With Supervision Additional ADL Goal #1: Pt will progress to minA for bed mobility with <2 verbal cues for proper log roll to EOB.  Plan Discharge plan remains appropriate    Co-evaluation                 AM-PAC OT "6 Clicks" Daily Activity     Outcome Measure   Help from another person eating meals?: A Little Help from another person taking care of personal grooming?: A Little Help from another person toileting, which includes using toliet, bedpan, or urinal?: A Lot Help from another person bathing (including washing, rinsing, drying)?: A Lot Help from another person to put on and taking off regular upper body clothing?: A  Little Help from another person to put on and taking off regular lower body clothing?: A Lot 6 Click Score: 15    End of Session Equipment Utilized During Treatment: Cervical collar  OT Visit Diagnosis: Unsteadiness on feet (R26.81);Muscle weakness (generalized) (M62.81);Pain;Hemiplegia and hemiparesis Hemiplegia - Right/Left: Left Hemiplegia - dominant/non-dominant: Non-Dominant Hemiplegia - caused by: Other cerebrovascular disease Pain - part of body:  (neck)   Activity Tolerance Patient tolerated treatment well;Treatment limited secondary to medical complications (Comment)   Patient Left in chair;with call bell/phone within reach   Nurse Communication Mobility status        Time: 6378-5885 OT Time Calculation (min): 25 min  Charges: OT General Charges $OT Visit: 1 Visit OT Treatments $Neuromuscular Re-education: 8-22 mins $Therapeutic Exercise: 8-22 mins  Flora Lipps, OTR/L  Acute Rehabilitation Services Pager: [phone] Office: [phone]    Brianni Manthe C 08/12/2020, 1:17 PM

## 2020-08-12 NOTE — Progress Notes (Signed)
Physical Therapy Treatment Patient Details Name: Keith Downs MRN: [id number] DOB: 09-05-1960 Today's Date: 08/12/2020    History of Present Illness Pt is a 69 male s/p arm pain, numbness and gait distubance. Pt is requiring decompressive anterior cervical discectomy C3-4;Decompressive anterior cervical corpectomy; Anterior cervical plating C3-6. PMHx:HTN, back sx, L foot drop.    PT Comments    Pt with improved UE movement today compared to previous days. Pt very eager and motivated to work with therapy. Pt with significant L LE extensive toner/spasticity requiring maxA to advance during ambulation. Trialed EVA walker today with maxAx2. Pt able to advance R LE but dependent for L LE advancement. Pt continuing to progress towards all goals and is very motivated. Pt to greatly benefit from CIR upon d/c for maximal functional recovery and progression towards independence. Acute PT to cont to follow.   Follow Up Recommendations  CIR;Supervision for mobility/OOB     Equipment Recommendations  Wheelchair (measurements PT);Wheelchair cushion (measurements PT)    Recommendations for Other Services Rehab consult     Precautions / Restrictions Precautions Precautions: Cervical;Fall Precaution Booklet Issued: Yes (comment) Precaution Comments: discussion of neck/cervical precautions; able to state with visual cues 3/3. Required Braces or Orthoses: Cervical Brace Cervical Brace: Hard collar (when up, per pt MD said) Restrictions Weight Bearing Restrictions: No    Mobility  Bed Mobility Overal bed mobility: Needs Assistance Bed Mobility: Rolling;Sidelying to Sit Rolling: Min assist Sidelying to sit: Mod assist       General bed mobility comments: pt with minimal functional use of L UE to assist during rolling and transfer to EOB, pt with mod/maxA for LE management due to spasticity  Transfers Overall transfer level: Needs assistance Equipment used: 2 person hand held  assist Transfers: Sit to/from Stand Sit to Stand: Max assist;+2 physical assistance         General transfer comment: R knee blocked, L knee locked due to extensor tone  Ambulation/Gait Ambulation/Gait assistance: Max assist;+2 physical assistance (+3rd person for chair follow) Gait Distance (Feet): 12 Feet Assistive device:  (EVA walker) Gait Pattern/deviations: Step-to pattern;Decreased stride length;Decreased dorsiflexion - left;Decreased dorsiflexion - right;Decreased weight shift to right;Ataxic;Trunk flexed;Narrow base of support Gait velocity: slow Gait velocity interpretation: <1.8 ft/sec, indicate of risk for recurrent falls General Gait Details: pt with truncal weakness requiring verbal cues to stand upright, pt able to self correct but unable to maintain >5 seconds. Pt able to advance R LE but required maxA to advance L LE, pt unable to initiate due to severe extensor spasticity, unable to activate hip/knee flexion despite max tactile cues, breaks left on EVA walker to minimize speed of walker advancement   Stairs             Wheelchair Mobility    Modified Rankin (Stroke Patients Only)       Balance Overall balance assessment: Needs assistance Sitting-balance support: Feet supported;Bilateral upper extremity supported Sitting balance-Leahy Scale: Poor Sitting balance - Comments: modA to attain EOB balance, but then supervision with UE support at EOB   Standing balance support: During functional activity Standing balance-Leahy Scale: Zero Standing balance comment: dep on EVA walker/external support                            Cognition Arousal/Alertness: Awake/alert Behavior During Therapy: WFL for tasks assessed/performed Overall Cognitive Status: Within Functional Limits for tasks assessed  General Comments: pt mildly impulsive due to eagerness to get up and progress      Exercises Other  Exercises Other Exercises: bilat ankle DF stretch Other Exercises: attempt to flex L knee    General Comments General comments (skin integrity, edema, etc.): incision covered by dressing      Pertinent Vitals/Pain Pain Assessment: Faces Faces Pain Scale: Hurts little more Pain Location: neck and legs Pain Descriptors / Indicators: Discomfort;Sore;Spasm Pain Intervention(s): Limited activity within patient's tolerance    Home Living                      Prior Function            PT Goals (current goals can now be found in the care plan section) Acute Rehab PT Goals Patient Stated Goal: to walk Progress towards PT goals: Progressing toward goals    Frequency    Min 5X/week      PT Plan Current plan remains appropriate    Co-evaluation              AM-PAC PT "6 Clicks" Mobility   Outcome Measure  Help needed turning from your back to your side while in a flat bed without using bedrails?: A Lot Help needed moving from lying on your back to sitting on the side of a flat bed without using bedrails?: A Lot Help needed moving to and from a bed to a chair (including a wheelchair)?: A Lot Help needed standing up from a chair using your arms (e.g., wheelchair or bedside chair)?: Total Help needed to walk in hospital room?: Total Help needed climbing 3-5 steps with a railing? : Total 6 Click Score: 9    End of Session Equipment Utilized During Treatment: Gait belt;Cervical collar Activity Tolerance: Patient tolerated treatment well Patient left: in chair;with call bell/phone within reach Nurse Communication: Mobility status PT Visit Diagnosis: Muscle weakness (generalized) (M62.81);Unsteadiness on feet (R26.81)     Time: 2229-7989 PT Time Calculation (min) (ACUTE ONLY): 24 min  Charges:  $Gait Training: 8-22 mins $Therapeutic Activity: 8-22 mins                     Lewis Shock, PT, DPT Acute Rehabilitation Services Pager #: [phone] Office #:  [phone]    Iona Hansen 08/12/2020, 10:01 AM

## 2020-08-12 NOTE — Progress Notes (Signed)
Neurosurgery Service Progress Note  Subjective: No acute events overnight, strength subjectively improving, pt was ambulating with assistance with PT this morning while I was rounding   Objective: Vitals:   08/11/20 1915 08/11/20 2332 08/12/20 0352 08/12/20 0807  BP: 140/80 136/82 (!) 142/87 139/83  Pulse: 70 68 79 73  Resp: 18 18 20 17   Temp: 98.1 F (36.7 C) 98 F (36.7 C) 98.4 F (36.9 C) 97.6 F (36.4 C)  TempSrc: Oral Oral Oral Oral  SpO2: 96% 99% 96% 97%  Weight:      Height:        Physical Exam: In C-collar, wound w/ dressing intact, walking with PT and assistive device, dragging L foot worse than right  Assessment & Plan: 60 y.o. man s/p cervical corpectomy for very severe cervical stenosis.  -CIR pending, medically ready for transfer to CIR -continue intensive PT/OT  67  08/12/20 9:06 AM

## 2020-08-13 ENCOUNTER — Encounter (HOSPITAL_COMMUNITY): Payer: Self-pay | Admitting: Neurological Surgery

## 2020-08-13 MED ORDER — DEXAMETHASONE SODIUM PHOSPHATE 4 MG/ML IJ SOLN: 2.0000 mg | Freq: Three times a day (TID) | INTRAMUSCULAR | Status: DC

## 2020-08-13 MED ORDER — DEXAMETHASONE 4 MG PO TABS
2.0000 mg | ORAL_TABLET | Freq: Three times a day (TID) | ORAL | Status: DC
  Administered 2020-08-13 – 2020-08-14 (×4): 2 mg via ORAL
  Filled 2020-08-13 (×4): qty 1

## 2020-08-13 MED FILL — Thrombin For Soln 5000 Unit: CUTANEOUS | Qty: 5000 | Status: AC

## 2020-08-13 NOTE — Progress Notes (Signed)
Physical Therapy Treatment Patient Details Name: Keith Downs MRN: [id number] DOB: [birth year] Today's Date: 08/13/2020    History of Present Illness Pt is a 43 male s/p arm pain, numbness and gait distubance. Pt is requiring decompressive anterior cervical discectomy C3-4;Decompressive anterior cervical corpectomy; Anterior cervical plating C3-6. PMHx:HTN, back sx, L foot drop.    PT Comments    Pt continues to be very motivated and driven and dilligently doing exercises and getting up in chair with assist. Focused on isolating LE muscle exercises and sit to stand initiation from chair. Pt with improved ability to move L LE despite increased tone. Pt amb 62' with EVA walker but continues to require mod/maxA for L LE advancement. Pt with improved ease of moving R LE, Pt continues to require assist for eva walker manipulation to allow for focus on keeping trunk upright and tactile cues for L LE sequencing. Pt to continue to benefit significantly from CIR upon d/c for maximal functional recovery. Acute PT to cont to follow.    Follow Up Recommendations  CIR;Supervision for mobility/OOB     Equipment Recommendations  Wheelchair (measurements PT);Wheelchair cushion (measurements PT)    Recommendations for Other Services Rehab consult     Precautions / Restrictions Precautions Precautions: Cervical;Fall Precaution Booklet Issued: Yes (comment) Precaution Comments: discussion of neck/cervical precautions; able to state with visual cues 3/3. Required Braces or Orthoses: Cervical Brace Cervical Brace: Hard collar (when OOB) Restrictions Weight Bearing Restrictions: No    Mobility  Bed Mobility               General bed mobility comments: pt in recliner upon arrival  Transfers Overall transfer level: Needs assistance Equipment used:  (eva walker) Transfers: Sit to/from Stand Sit to Stand: Mod assist         General transfer comment: worked on sit to stand prep  exercises, working on push up on had rests and lifting buttocks off chair  Ambulation/Gait Ambulation/Gait assistance: Max assist;+2 safety/equipment (chair follow and help with eva walker while PT works with L LE) Gait Distance (Feet): 35 Feet Assistive device:  (eva walker) Gait Pattern/deviations: Step-through pattern;Decreased stance time - left;Decreased step length - left;Decreased dorsiflexion - left;Decreased dorsiflexion - right;Decreased stride length;Decreased weight shift to right;Narrow base of support;Ataxic Gait velocity: slow Gait velocity interpretation: <1.8 ft/sec, indicate of risk for recurrent falls General Gait Details: pt instructed to focus on maintaining upright posture and listening to verbal cues from PT for gait sequencing. Pt remains to have difficulty advancing L LE, pt with significant tone at times requiring maxA other time not as significant requiring modA. pt giving valent effort to advance however minimal hip flexion strength, no L knee buckling due as pt with increased tone. pt able to clear R foot, R LE with ataxia as well but not as severe at L LE. Eva walker used to provide more truncal support to allow pt to focus on LE stepping sequencing   Stairs             Wheelchair Mobility    Modified Rankin (Stroke Patients Only)       Balance Overall balance assessment: Needs assistance Sitting-balance support: Feet supported;Bilateral upper extremity supported Sitting balance-Leahy Scale: Poor Sitting balance - Comments: pt continues to require bilat UE support however is working towards maintaining midline position   Standing balance support: During functional activity Standing balance-Leahy Scale: Zero Standing balance comment: dep on EVA walker/external support  Cognition Arousal/Alertness: Awake/alert Behavior During Therapy: WFL for tasks assessed/performed Overall Cognitive Status: Within Functional  Limits for tasks assessed                                 General Comments: mildly impulsive due to very motivated      Exercises General Exercises - Lower Extremity Ankle Circles/Pumps: PROM;Both;10 reps;Seated Long Arc Quad: AROM;10 reps;Seated;Both (less than 1/2 ROM on L side) Heel Slides: AROM;AAROM;Both;10 reps;Seated (able to initate heel slide on L, requires AA to bring heel to chair (used foot on towel), pt able to complete full ROM on R side without assist) Hip ABduction/ADduction: AAROM;Both;10 reps;Seated (AA stretch into ABDUCTION) Other Exercises Other Exercises: worked on sit to stand prep exercises by initiatiating powering up using more LEs than UEs, tactile cues at hips    General Comments General comments (skin integrity, edema, etc.): incision with honeycomb dressing on it      Pertinent Vitals/Pain Pain Assessment: Faces Faces Pain Scale: Hurts little more Pain Location: neck and legs Pain Descriptors / Indicators: Discomfort;Sore;Spasm Pain Intervention(s): Monitored during session    Home Living                      Prior Function            PT Goals (current goals can now be found in the care plan section) Acute Rehab PT Goals Patient Stated Goal: to walk Progress towards PT goals: Progressing toward goals    Frequency    Min 5X/week      PT Plan Current plan remains appropriate    Co-evaluation              AM-PAC PT "6 Clicks" Mobility   Outcome Measure  Help needed turning from your back to your side while in a flat bed without using bedrails?: A Lot Help needed moving from lying on your back to sitting on the side of a flat bed without using bedrails?: A Lot Help needed moving to and from a bed to a chair (including a wheelchair)?: A Lot Help needed standing up from a chair using your arms (e.g., wheelchair or bedside chair)?: Total Help needed to walk in hospital room?: Total Help needed climbing 3-5  steps with a railing? : Total 6 Click Score: 9    End of Session Equipment Utilized During Treatment: Gait belt;Cervical collar Activity Tolerance: Patient tolerated treatment well Patient left: in chair;with call bell/phone within reach;with family/visitor present Nurse Communication: Mobility status PT Visit Diagnosis: Muscle weakness (generalized) (M62.81);Unsteadiness on feet (R26.81)     Time: 7026-3785 PT Time Calculation (min) (ACUTE ONLY): 35 min  Charges:  $Gait Training: 8-22 mins $Therapeutic Exercise: 8-22 mins                     Lewis Shock, PT, DPT Acute Rehabilitation Services Pager #: [phone] Office #: [phone]    Iona Hansen 08/13/2020, 12:57 PM

## 2020-08-13 NOTE — Progress Notes (Signed)
Continues to improve. Some DF and PF today holding coffee cup in left hand, standing better, no pain, in collar. Continue PT/OT/ CIR. Pleased with rapid progress. Will need to titrate baclofen up over time for spasticity

## 2020-08-13 NOTE — Progress Notes (Signed)
Inpatient Rehab Admissions Coordinator:   I  Submitted a request for insurance authorization this morning but have not yet received a response. I anticipate a response tomorrow. I have notified Pt. And will continue to pursue for potential CIR admit pending bed availability and insurance auth.   Megan Salon, MS, CCC-SLP Rehab Admissions Coordinator  [phone] (celll) [phone] (office)

## 2020-08-14 ENCOUNTER — Encounter (HOSPITAL_COMMUNITY): Payer: Self-pay | Admitting: Neurological Surgery

## 2020-08-14 ENCOUNTER — Encounter (HOSPITAL_COMMUNITY): Payer: Self-pay | Admitting: Physical Medicine and Rehabilitation

## 2020-08-14 ENCOUNTER — Other Ambulatory Visit: Payer: Self-pay

## 2020-08-14 ENCOUNTER — Inpatient Hospital Stay (HOSPITAL_COMMUNITY)
Admission: RE | Admit: 2020-08-14 | Discharge: 2020-08-30 | DRG: 560 | Disposition: A | Discharge status: Home / Self Care | Payer: BC Managed Care – PPO | Source: Intra-hospital | Attending: Physical Medicine and Rehabilitation | Admitting: Physical Medicine and Rehabilitation

## 2020-08-14 DIAGNOSIS — J449 Chronic obstructive pulmonary disease, unspecified: Secondary | ICD-10-CM | POA: Diagnosis present

## 2020-08-14 DIAGNOSIS — R001 Bradycardia, unspecified: Secondary | ICD-10-CM | POA: Diagnosis present

## 2020-08-14 DIAGNOSIS — Z981 Arthrodesis status: Secondary | ICD-10-CM

## 2020-08-14 DIAGNOSIS — G8918 Other acute postprocedural pain: Secondary | ICD-10-CM | POA: Diagnosis not present

## 2020-08-14 DIAGNOSIS — K5900 Constipation, unspecified: Secondary | ICD-10-CM | POA: Diagnosis present

## 2020-08-14 DIAGNOSIS — Z800 Family history of malignant neoplasm of digestive organs: Secondary | ICD-10-CM

## 2020-08-14 DIAGNOSIS — I10 Essential (primary) hypertension: Secondary | ICD-10-CM

## 2020-08-14 DIAGNOSIS — N400 Enlarged prostate without lower urinary tract symptoms: Secondary | ICD-10-CM | POA: Diagnosis present

## 2020-08-14 DIAGNOSIS — G959 Disease of spinal cord, unspecified: Secondary | ICD-10-CM | POA: Diagnosis present

## 2020-08-14 DIAGNOSIS — D72828 Other elevated white blood cell count: Secondary | ICD-10-CM | POA: Diagnosis present

## 2020-08-14 DIAGNOSIS — M5412 Radiculopathy, cervical region: Secondary | ICD-10-CM | POA: Diagnosis present

## 2020-08-14 DIAGNOSIS — Z79890 Hormone replacement therapy (postmenopausal): Secondary | ICD-10-CM | POA: Diagnosis not present

## 2020-08-14 DIAGNOSIS — K219 Gastro-esophageal reflux disease without esophagitis: Secondary | ICD-10-CM | POA: Diagnosis present

## 2020-08-14 DIAGNOSIS — R270 Ataxia, unspecified: Secondary | ICD-10-CM | POA: Diagnosis present

## 2020-08-14 DIAGNOSIS — R319 Hematuria, unspecified: Secondary | ICD-10-CM | POA: Diagnosis present

## 2020-08-14 DIAGNOSIS — N319 Neuromuscular dysfunction of bladder, unspecified: Secondary | ICD-10-CM | POA: Diagnosis present

## 2020-08-14 DIAGNOSIS — Z23 Encounter for immunization: Secondary | ICD-10-CM | POA: Diagnosis not present

## 2020-08-14 DIAGNOSIS — Z4789 Encounter for other orthopedic aftercare: Secondary | ICD-10-CM | POA: Diagnosis present

## 2020-08-14 DIAGNOSIS — G4700 Insomnia, unspecified: Secondary | ICD-10-CM | POA: Diagnosis present

## 2020-08-14 DIAGNOSIS — G8220 Paraplegia, unspecified: Secondary | ICD-10-CM | POA: Diagnosis not present

## 2020-08-14 DIAGNOSIS — L259 Unspecified contact dermatitis, unspecified cause: Secondary | ICD-10-CM | POA: Diagnosis not present

## 2020-08-14 DIAGNOSIS — R531 Weakness: Secondary | ICD-10-CM | POA: Diagnosis present

## 2020-08-14 DIAGNOSIS — Z79899 Other long term (current) drug therapy: Secondary | ICD-10-CM

## 2020-08-14 DIAGNOSIS — M5001 Cervical disc disorder with myelopathy,  high cervical region: Secondary | ICD-10-CM | POA: Diagnosis present

## 2020-08-14 DIAGNOSIS — E871 Hypo-osmolality and hyponatremia: Secondary | ICD-10-CM | POA: Diagnosis present

## 2020-08-14 DIAGNOSIS — R682 Dry mouth, unspecified: Secondary | ICD-10-CM | POA: Diagnosis present

## 2020-08-14 DIAGNOSIS — R252 Cramp and spasm: Secondary | ICD-10-CM

## 2020-08-14 DIAGNOSIS — R739 Hyperglycemia, unspecified: Secondary | ICD-10-CM | POA: Diagnosis present

## 2020-08-14 DIAGNOSIS — E039 Hypothyroidism, unspecified: Secondary | ICD-10-CM | POA: Diagnosis present

## 2020-08-14 DIAGNOSIS — Z7982 Long term (current) use of aspirin: Secondary | ICD-10-CM

## 2020-08-14 DIAGNOSIS — N401 Enlarged prostate with lower urinary tract symptoms: Secondary | ICD-10-CM

## 2020-08-14 MED ORDER — FLEET ENEMA 7-19 GM/118ML RE ENEM: 1.0000 | ENEMA | Freq: Once | RECTAL | Status: DC | PRN

## 2020-08-14 MED ORDER — SENNA 8.6 MG PO TABS
1.0000 | ORAL_TABLET | Freq: Two times a day (BID) | ORAL | Status: DC
  Administered 2020-08-14 – 2020-08-27 (×25): 8.6 mg via ORAL
  Filled 2020-08-14 (×28): qty 1

## 2020-08-14 MED ORDER — GABAPENTIN 300 MG PO CAPS
300.0000 mg | ORAL_CAPSULE | Freq: Three times a day (TID) | ORAL | Status: DC
  Administered 2020-08-14 – 2020-08-30 (×47): 300 mg via ORAL
  Filled 2020-08-14 (×47): qty 1

## 2020-08-14 MED ORDER — OXYCODONE HCL 5 MG PO TABS: 5.0000 mg | ORAL_TABLET | ORAL | Status: DC | PRN

## 2020-08-14 MED ORDER — DEXAMETHASONE SODIUM PHOSPHATE 4 MG/ML IJ SOLN
2.0000 mg | Freq: Three times a day (TID) | INTRAMUSCULAR | Status: DC
  Filled 2020-08-14 (×3): qty 1

## 2020-08-14 MED ORDER — METHOCARBAMOL 500 MG PO TABS: 500.0000 mg | ORAL_TABLET | Freq: Four times a day (QID) | ORAL | 0 refills | Status: DC

## 2020-08-14 MED ORDER — DEXAMETHASONE 2 MG PO TABS
2.0000 mg | ORAL_TABLET | Freq: Three times a day (TID) | ORAL | Status: DC
  Administered 2020-08-14 – 2020-08-20 (×17): 2 mg via ORAL
  Filled 2020-08-14 (×17): qty 1

## 2020-08-14 MED ORDER — MOMETASONE FURO-FORMOTEROL FUM 100-5 MCG/ACT IN AERO
2.0000 | INHALATION_SPRAY | Freq: Two times a day (BID) | RESPIRATORY_TRACT | Status: DC
  Administered 2020-08-14 – 2020-08-30 (×29): 2 via RESPIRATORY_TRACT
  Filled 2020-08-14: qty 8.8

## 2020-08-14 MED ORDER — ALBUTEROL SULFATE HFA 108 (90 BASE) MCG/ACT IN AERS: 2.0000 | INHALATION_SPRAY | Freq: Four times a day (QID) | RESPIRATORY_TRACT | Status: DC | PRN

## 2020-08-14 MED ORDER — LEVOTHYROXINE SODIUM 25 MCG PO TABS
125.0000 ug | ORAL_TABLET | Freq: Every day | ORAL | Status: DC
  Administered 2020-08-15 – 2020-08-30 (×16): 125 ug via ORAL
  Filled 2020-08-14 (×16): qty 1

## 2020-08-14 MED ORDER — BACLOFEN 5 MG HALF TABLET
5.0000 mg | ORAL_TABLET | Freq: Three times a day (TID) | ORAL | Status: DC
  Administered 2020-08-14 – 2020-08-15 (×2): 5 mg via ORAL
  Filled 2020-08-14 (×2): qty 1

## 2020-08-14 MED ORDER — HYDROCHLOROTHIAZIDE 12.5 MG PO CAPS
12.5000 mg | ORAL_CAPSULE | Freq: Every day | ORAL | Status: DC
  Administered 2020-08-15 – 2020-08-16 (×2): 12.5 mg via ORAL
  Filled 2020-08-14 (×2): qty 1

## 2020-08-14 MED ORDER — ACETAMINOPHEN 325 MG PO TABS
325.0000 mg | ORAL_TABLET | ORAL | Status: DC | PRN
  Administered 2020-08-19 – 2020-08-22 (×3): 650 mg via ORAL
  Filled 2020-08-14 (×3): qty 2

## 2020-08-14 MED ORDER — PHENOL 1.4 % MT LIQD
1.0000 | OROMUCOSAL | Status: DC | PRN
  Filled 2020-08-14: qty 177

## 2020-08-14 MED ORDER — IRBESARTAN 75 MG PO TABS
150.0000 mg | ORAL_TABLET | Freq: Every day | ORAL | Status: DC
  Administered 2020-08-15 – 2020-08-16 (×2): 150 mg via ORAL
  Filled 2020-08-14 (×2): qty 2

## 2020-08-14 MED ORDER — ASPIRIN EC 81 MG PO TBEC
81.0000 mg | DELAYED_RELEASE_TABLET | Freq: Every day | ORAL | Status: DC
  Administered 2020-08-15 – 2020-08-30 (×16): 81 mg via ORAL
  Filled 2020-08-14 (×16): qty 1

## 2020-08-14 MED ORDER — PNEUMOCOCCAL VAC POLYVALENT 25 MCG/0.5ML IJ INJ
0.5000 mL | INJECTION | INTRAMUSCULAR | Status: AC
  Administered 2020-08-15: 0.5 mL via INTRAMUSCULAR
  Filled 2020-08-14: qty 0.5

## 2020-08-14 MED ORDER — PROCHLORPERAZINE 25 MG RE SUPP: 12.5000 mg | Freq: Four times a day (QID) | RECTAL | Status: DC | PRN

## 2020-08-14 MED ORDER — ADULT MULTIVITAMIN W/MINERALS CH: 1.0000 | ORAL_TABLET | Freq: Every day | ORAL | Status: DC

## 2020-08-14 MED ORDER — PANTOPRAZOLE SODIUM 40 MG PO TBEC
40.0000 mg | DELAYED_RELEASE_TABLET | Freq: Every day | ORAL | Status: DC
  Administered 2020-08-15 – 2020-08-17 (×3): 40 mg via ORAL
  Filled 2020-08-14 (×3): qty 1

## 2020-08-14 MED ORDER — BISACODYL 10 MG RE SUPP: 10.0000 mg | Freq: Every day | RECTAL | Status: DC | PRN

## 2020-08-14 MED ORDER — POLYETHYLENE GLYCOL 3350 17 G PO PACK
17.0000 g | PACK | Freq: Every day | ORAL | Status: DC | PRN
  Administered 2020-08-23: 17 g via ORAL
  Filled 2020-08-14: qty 1

## 2020-08-14 MED ORDER — TRAMADOL HCL 50 MG PO TABS
50.0000 mg | ORAL_TABLET | Freq: Four times a day (QID) | ORAL | Status: DC | PRN
  Administered 2020-08-15 – 2020-08-23 (×9): 50 mg via ORAL
  Filled 2020-08-14 (×9): qty 1

## 2020-08-14 MED ORDER — MENTHOL 3 MG MT LOZG: 1.0000 | LOZENGE | OROMUCOSAL | Status: DC | PRN

## 2020-08-14 MED ORDER — PROCHLORPERAZINE MALEATE 5 MG PO TABS: 5.0000 mg | ORAL_TABLET | Freq: Four times a day (QID) | ORAL | Status: DC | PRN

## 2020-08-14 MED ORDER — ROSUVASTATIN CALCIUM 5 MG PO TABS
10.0000 mg | ORAL_TABLET | Freq: Every day | ORAL | Status: DC
  Administered 2020-08-14 – 2020-08-29 (×16): 10 mg via ORAL
  Filled 2020-08-14 (×16): qty 2

## 2020-08-14 MED ORDER — DIPHENHYDRAMINE HCL 12.5 MG/5ML PO ELIX: 12.5000 mg | ORAL_SOLUTION | Freq: Four times a day (QID) | ORAL | Status: DC | PRN

## 2020-08-14 MED ORDER — PROCHLORPERAZINE EDISYLATE 10 MG/2ML IJ SOLN: 5.0000 mg | Freq: Four times a day (QID) | INTRAMUSCULAR | Status: DC | PRN

## 2020-08-14 MED ORDER — GUAIFENESIN-DM 100-10 MG/5ML PO SYRP: 5.0000 mL | ORAL_SOLUTION | Freq: Four times a day (QID) | ORAL | Status: DC | PRN

## 2020-08-14 MED ORDER — TRAZODONE HCL 50 MG PO TABS
25.0000 mg | ORAL_TABLET | Freq: Every evening | ORAL | Status: DC | PRN
  Administered 2020-08-19 – 2020-08-21 (×3): 50 mg via ORAL
  Filled 2020-08-14 (×3): qty 1

## 2020-08-14 MED ORDER — ALUM & MAG HYDROXIDE-SIMETH 200-200-20 MG/5ML PO SUSP: 30.0000 mL | ORAL | Status: DC | PRN

## 2020-08-14 MED ORDER — ADULT MULTIVITAMIN W/MINERALS CH
1.0000 | ORAL_TABLET | Freq: Every day | ORAL | Status: DC
  Administered 2020-08-15 – 2020-08-30 (×16): 1 via ORAL
  Filled 2020-08-14 (×16): qty 1

## 2020-08-14 MED ORDER — OXYCODONE-ACETAMINOPHEN 5-325 MG PO TABS: 1.0000 | ORAL_TABLET | ORAL | 0 refills | Status: DC | PRN

## 2020-08-14 NOTE — Discharge Summary (Signed)
Physician Discharge Summary  Patient ID: Cleve Paolillo MRN: [id number] DOB/AGE: 1960/08/12 60 y.o.  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 The patient tolerated the procedure well and was taken to the recovery room and then to the floor in stable condition. The hospital course was routine. There were no complications. The wound remained clean dry and intact. Pt had appropriate neck soreness. No complaints of arm pain or new N/T/W. The patient remained afebrile with stable vital signs, and tolerated a regular diet. The patient continued to increase activities, and pain was well controlled with oral pain medications.   Consults: None  Significant Diagnostic Studies:  Results for orders placed or performed during the hospital encounter of 08/09/20  SARS Coronavirus 2 by RT PCR (hospital order, performed in [facility] hospital lab) Nasopharyngeal Nasopharyngeal Swab   Specimen: Nasopharyngeal Swab  Result Value Ref Range   SARS Coronavirus 2 NEGATIVE NEGATIVE  Basic metabolic panel  Result Value Ref Range   Sodium 141 135 - 145 mmol/L   Potassium 4.6 3.5 - 5.1 mmol/L   Chloride 104 98 - 111 mmol/L   CO2 26 22 - 32 mmol/L   Glucose, Bld 104 (H) 70 - 99 mg/dL   BUN 17 6 - 20 mg/dL   Creatinine, Ser 1.66 0.61 - 1.24 mg/dL   Calcium 9.5 8.9 - 06.3 mg/dL   GFR, Estimated >01 >60 mL/min   Anion gap 11 5 - 15  CBC WITH DIFFERENTIAL  Result Value Ref Range   WBC 8.6 4.0 - 10.5 K/uL   RBC 4.96 4.22 - 5.81 MIL/uL   Hemoglobin 14.8 13.0 - 17.0 g/dL   HCT 10.9 32.3 - 55.7 %   MCV 88.1 80.0 - 100.0 fL   MCH 29.8  26.0 - 34.0 pg   MCHC 33.9 30.0 - 36.0 g/dL   RDW 32.2 02.5 - 42.7 %   Platelets 187 150 - 400 K/uL   nRBC 0.0 0.0 - 0.2 %   Neutrophils Relative % 73 %   Neutro Abs 6.2 1.7 - 7.7 K/uL   Lymphocytes Relative 18 %   Lymphs Abs 1.5 0.7 - 4.0 K/uL   Monocytes Relative 5 %   Monocytes Absolute 0.5 0.1 - 1.0 K/uL   Eosinophils Relative 3 %   Eosinophils Absolute 0.3 0.0 - 0.5 K/uL   Basophils Relative 1 %   Basophils Absolute 0.1 0.0 - 0.1 K/uL   Immature Granulocytes 0 %   Abs Immature Granulocytes 0.03 0.00 - 0.07 K/uL  Protime-INR  Result Value Ref Range   Prothrombin Time 12.6 11.4 - 15.2 seconds   INR 1.0 0.8 - 1.2  Type and screen [facility]  Result Value Ref Range   ABO/RH(D) O POS    Antibody Screen NEG    Sample Expiration      08/12/2020,2359 Performed at [facility], [address] [place], [place] [address]   ABO/Rh  Result Value Ref Range   ABO/RH(D)      O POS Performed at [facility], [address], [place], [place] [address]     Chest 2 View  Result Date: 08/09/2020 CLINICAL DATA:  Cervical myelopathy.  Preoperative respiratory exam. EXAM: CHEST - 2 VIEW COMPARISON:  None. FINDINGS: Heart size is normal. Mediastinal shadows are normal. Lungs are clear, but there may be a degree emphysema. No active infiltrate, mass, effusion or collapse. Bilateral nipple shadows. 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 Mild kyphotic deformity of a lower thoracic vertebral body. Mild thoracolumbar scoliotic curvature. IMPRESSION: No active disease. Possible emphysema. Electronically Signed   By: Paulina Fusi M.D.   On: 08/09/2020 08:00   DG Cervical Spine 2-3 Views  Result Date: 08/09/2020 CLINICAL DATA:  C5 corpectomy with anterior fusion EXAM: CERVICAL SPINE - 2-3 VIEW; DG C-ARM 1-60 MIN COMPARISON:  Cervical spine radiograph July 04, 2020 FLUOROSCOPY TIME:  0 minutes 44 seconds; 4.80 mGy; 2 acquired images FINDINGS: Frontal and  lateral views obtained. Evidence of corpectomy at C5. Anterior screw and plate fixation from C3-C6 with pedicle screws in the anterior C3, C4, and C6 vertebral bodies. Disc spacers noted at C3-4, C4-5, and C5-6. No fracture or spondylolisthesis. IMPRESSION: Postoperative anterior screw and plate fixation from C3-C6 with support hardware intact. Evident corpectomy at C5. No acute fracture or spondylolisthesis. Electronically Signed   By: Bretta Bang III M.D.   On: 08/09/2020 13:58   MR CERVICAL SPINE WO CONTRAST  Result Date: 08/09/2020 CLINICAL DATA:  Post ACDF.  Bilateral leg weakness EXAM: MRI CERVICAL SPINE WITHOUT CONTRAST TECHNIQUE: Multiplanar, multisequence MR imaging of the cervical spine was performed. No intravenous contrast was administered. COMPARISON:  MRI cervical spine 07/02/2020. FINDINGS: Alignment: Normal alignment with straightening of the cervical lordosis. Vertebrae: Negative for fracture Hardware: ACDF C3 through C6 with anterior plate and screws. Corpectomy at C5 with bone graft in the corpectomy site. Cord: There is significant cord flattening due to spinal stenosis at C3 and C4. There is linear cord hyperintensity at the C4 and C5 levels. Previously there was severe cord compression and cord hyperintensity maximally at C4-5 but also at C3-4 and C5-6. No epidural hematoma identified. Posterior Fossa, vertebral arteries, paraspinal tissues: Mild prevertebral edema throughout the cervical spine extending from C2 through T2. Disc levels: C2-3: Mild disc degeneration.  Mild spinal stenosis C3-4: ACDF. Diffuse uncinate spurring with moderate spinal stenosis. Cord flattening with cord hyperintensity. Moderate foraminal stenosis bilaterally due to spurring. Interval improvement since the prior study. C4-5: ACDF. Diffuse spurring of the uncinate process. Moderate spinal stenosis with cord flattening and cord hyperintensity. Improvement in spinal stenosis and cord compression. Moderate  foraminal stenosis bilaterally due to uncinate spurring. C5-6: C5 corpectomy. C5-6 ACDF. Diffuse uncinate spurring. Cord flattening and mild cord hyperintensity. Spinal stenosis has improved. Mild foraminal stenosis bilaterally. C6-7: Disc degeneration with asymmetric uncinate spurring on the right. Cord flattening with mild spinal stenosis unchanged. Moderate right foraminal stenosis due to spurring unchanged. C7-T1: Negative for stenosis. There appears to be partial fusion of C7 and T1 vertebral bodies. IMPRESSION: 1. Postop ACDF C3 through C6.  Corpectomy at C5 with bone graft. 2. Interval decompression of the spinal canal with improvement in spinal stenosis as described above. Previously there was severe spinal stenosis and cord compression C3 through C6 most severe at C4-5. There is improved spinal stenosis with residual cord flattening and cord hyperintensity at C4-5 and C5-6. 3. Right-sided spurring at C6-7 with mild spinal stenosis and moderate right foraminal stenosis unchanged. 4. Negative for epidural hematoma. Mild prevertebral soft tissue edema likely related to recent surgery. Electronically Signed   By: Marlan Palau M.D.   On: 08/09/2020 16:11  DG C-Arm 1-60 Min  Result Date: 08/09/2020 CLINICAL DATA:  C5 corpectomy with anterior fusion EXAM: CERVICAL SPINE - 2-3 VIEW; DG C-ARM 1-60 MIN COMPARISON:  Cervical spine radiograph July 04, 2020 FLUOROSCOPY TIME:  0 minutes 44 seconds; 4.80 mGy; 2 acquired images FINDINGS: Frontal and lateral views obtained. Evidence of corpectomy at C5. Anterior screw and plate fixation from C3-C6 with pedicle screws in the anterior C3, C4, and C6 vertebral bodies. Disc spacers noted at C3-4, C4-5, and C5-6. No fracture or spondylolisthesis. IMPRESSION: Postoperative anterior screw and plate fixation from C3-C6 with support hardware intact. Evident corpectomy at C5. No acute fracture or spondylolisthesis. Electronically Signed   By: Bretta BangWilliam  Woodruff III M.D.    On: 08/09/2020 13:58    Antibiotics:  Anti-infectives (From admission, onward)   Start     Dose/Rate Route Frequency Ordered Stop   08/09/20 1800  ceFAZolin (ANCEF) IVPB 2g/100 mL premix        2 g 200 mL/hr over 30 Minutes Intravenous Every 8 hours 08/09/20 1700 08/10/20 0117   08/09/20 0730  ceFAZolin (ANCEF) IVPB 2g/100 mL premix        2 g 200 mL/hr over 30 Minutes Intravenous On call to O.R. 08/09/20 0722 08/09/20 1058      Discharge Exam: Blood pressure 113/69, pulse 61, temperature (!) 97.5 F (36.4 C), temperature source Oral, resp. rate 17, height 5\' 11"  (1.803 m), weight 88 kg, SpO2 100 %. Neurologic: Grossly normal, strength continues to improve 4/5 upper extremities, hypertonic lower extremities, 2/5 dorsiflexion and plantar flexion on the left  Discharge Medications:   Allergies as of 08/14/2020      Reactions   Niacin And Related    Face flushed      Medication List    TAKE these medications   albuterol 108 (90 Base) MCG/ACT inhaler Commonly known as: VENTOLIN HFA Inhale 2 puffs into the lungs every 6 (six) hours as needed for wheezing or shortness of breath.   aspirin EC 81 MG tablet Take 81 mg by mouth daily. Swallow whole.   budesonide-formoterol 80-4.5 MCG/ACT inhaler Commonly known as: SYMBICORT Inhale 2 puffs into the lungs 2 (two) times daily as needed (shortness of breath).   fluticasone 50 MCG/ACT nasal spray Commonly known as: FLONASE Place 2 sprays into both nostrils daily.   gabapentin 300 MG capsule Commonly known as: NEURONTIN Take 300 mg by mouth 3 (three) times daily.   ibuprofen 800 MG tablet Commonly known as: ADVIL Take 800 mg by mouth 3 (three) times daily.   levothyroxine 125 MCG tablet Commonly known as: SYNTHROID Take 125 mcg by mouth daily before breakfast.   methocarbamol 500 MG tablet Commonly known as: Robaxin Take 1 tablet (500 mg total) by mouth 4 (four) times daily.   multivitamin with minerals Tabs tablet Take 1  tablet by mouth daily.   olmesartan-hydrochlorothiazide 20-12.5 MG tablet Commonly known as: BENICAR HCT Take 1 tablet by mouth daily.   omeprazole 40 MG capsule Commonly known as: PRILOSEC Take 40 mg by mouth daily.   oxyCODONE-acetaminophen 5-325 MG tablet Commonly known as: Percocet Take 1 tablet by mouth every 4 (four) hours as needed for severe pain.   rosuvastatin 10 MG tablet Commonly known as: CRESTOR Take 10 mg by mouth daily.            Durable Medical Equipment  (From admission, onward)         Start     Ordered   08/09/20 1701  DME  Walker rolling  Once       Question Answer Comment  Walker: With 5 Inch Wheels   Patient needs a walker to treat with the following condition Gait disturbance      08/09/20 1700          Disposition: rehab   Final Dx: acdf C3-4, corpectomy C5 with anterior plating C3-6  Discharge Instructions    Call MD for:  difficulty breathing, headache or visual disturbances   Complete by: As directed    Call MD for:  hives   Complete by: As directed    Call MD for:  persistant dizziness or light-headedness   Complete by: As directed    Call MD for:  persistant nausea and vomiting   Complete by: As directed    Call MD for:  redness, tenderness, or signs of infection (pain, swelling, redness, odor or green/yellow discharge around incision site)   Complete by: As directed    Call MD for:  severe uncontrolled pain   Complete by: As directed    Call MD for:  temperature >100.4   Complete by: As directed    Diet - low sodium heart healthy   Complete by: As directed    Increase activity slowly   Complete by: As directed    No wound care   Complete by: As directed          Signed: Tiana Loft [place] Labo 08/14/2020, 10:51 AM

## 2020-08-14 NOTE — Progress Notes (Signed)
Inpatient Rehabilitation Medication Review by a Pharmacist  A complete drug regimen review was completed for this patient to identify any potential clinically significant medication issues.  Clinically significant medication issues were identified:  yes   Type of Medication Issue Identified Description of Issue Urgent (address now) Non-Urgent (address on AM team rounds) Plan Plan Accepted by Provider? (Yes / No / Pending AM Rounds)  Drug Interaction(s) (clinically significant)       Duplicate Therapy       Allergy       No Medication Administration End Date       Incorrect Dose       Additional Drug Therapy Needed  Resume home [id number] and Crestor? Non-urgent Message Dr. Berline Chough and Dr. Allena Katz F/u  Other         Name of provider notified for urgent issues identified:   Provider Method of Notification:    For non-urgent medication issues to be resolved on team rounds tomorrow morning a [facility] Secure Chat Handoff was sent to:    Pharmacist comments: f/u to resume home maintenance meds ASA and Crestor  Time spent performing this drug regimen review (minutes):   Keith Downs, PharmD, BCPS Clinical Staff Pharmacist [email or web address] Pasty Spillers 08/14/2020 3:49 PM

## 2020-08-14 NOTE — H&P (Addendum)
Physical Medicine and Rehabilitation Admission H&P    CC: Cervical myelopathy with functional deficits.   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  History taken from chart review and patient.  He failed conservative management and was admitted on 08/09/2021 for ACDF of C3-C6 with arthrodesis of C3-C4 and C4-C6 by Dr. Yetta Barre. Post surgery in PACU he was found to have LUE>RUE weakness, flaccid LLE and RLE weakness with increased tone. MRI C spine was negative for hematoma and showed interval decompression with improvement in spinal stenosis. Weakness felt to be due to cord reexpansion after severe compression and he was started on Decadron with slow improvement.  Baclofen was added for spasticity and foley in place due to neurogenic bladder. Therapy has been ongoing and he continues to be limited by ataxia with bilateral lower extremity weakness, DOE and LUE weakness affecting ADLs. CIR recommended due to functional decline.  Please see preadmission assessment from earlier today as well.  Review of Systems  Constitutional: Negative for chills and fever.  HENT: Negative for hearing loss and tinnitus.   Eyes: Negative for blurred vision and double vision.  Respiratory: Negative for cough and shortness of breath.   Cardiovascular: Negative for chest pain and palpitations.  Gastrointestinal: Negative for constipation, heartburn and nausea.  Genitourinary: Negative for dysuria and urgency.  Musculoskeletal: Positive for myalgias and neck pain.  Skin: Negative for rash.  Neurological: Positive for sensory change, focal weakness and weakness. Negative for dizziness and headaches.  All other systems reviewed and are negative.   Past Medical History:  Diagnosis Date  . Ambulates with cane   . Anemia   . Arthritis     back, knee  . Asthma   . BPH (benign prostatic hyperplasia)   . ED (erectile dysfunction)   . Foot drop, left    some in right foot  . GERD (gastroesophageal reflux disease)   . Headache   . History of hiatal hernia    patient denied this dx  . HLD (hyperlipidemia)   . Hypertension   . Hypothyroidism   . Neuromuscular disorder (HCC)    numbness in right hand  . Peripheral vascular disease (HCC)   . Seasonal allergies   . Valvular heart disease    followed by [facility] cardiology    Past Surgical History:  Procedure Laterality Date  . ANTERIOR CERVICAL CORPECTOMY N/A 08/09/2020   Procedure: Corpectomy Cervical five with anterior plating Cervical three-Cervical six;  Surgeon: Tia Alert, MD;  Location: [facility] OR;  Service: Neurosurgery;  Laterality: N/A;  . ANTERIOR CERVICAL DECOMP/DISCECTOMY FUSION N/A 08/09/2020   Procedure: Anterior Cervical Decompression Fusion  - Cervical three-Cervical four;  Surgeon: Tia Alert, MD;  Location: [facility] OR;  Service: Neurosurgery;  Laterality: N/A;  . BACK SURGERY     L5-S1  . CARDIAC CATHETERIZATION  2019  . COLONOSCOPY     multiple  . cyst removal     from head  . HAND SURGERY  1982  . HEMORRHOID SURGERY    . HERNIA REPAIR Bilateral   . PROSTATE BIOPSY    . UPPER GI ENDOSCOPY     multiple  . VEIN SURGERY  2019    Family History  Problem Relation Age of Onset  . Colon cancer Mother      Social  History: Married. Independent and working in [employer].  He reports that he has never smoked. He has never used smokeless tobacco. He reports current alcohol use of about 6.0 standard drinks of alcohol per week. He reports that he does not use drugs.    Allergies  Allergen Reactions  . Niacin And Related     Face flushed    Medications Prior to Admission  Medication Sig Dispense Refill  . albuterol (VENTOLIN HFA) 108 (90 Base) MCG/ACT inhaler Inhale 2 puffs into the lungs every 6 (six) hours as needed for wheezing or  shortness of breath.    . fluticasone (FLONASE) 50 MCG/ACT nasal spray Place 2 sprays into both nostrils daily.    Marland Kitchen gabapentin (NEURONTIN) 300 MG capsule Take 300 mg by mouth 3 (three) times daily.    Marland Kitchen ibuprofen (ADVIL) 800 MG tablet Take 800 mg by mouth 3 (three) times daily.    Marland Kitchen levothyroxine (SYNTHROID) 125 MCG tablet Take 125 mcg by mouth daily before breakfast.    . Multiple Vitamin (MULTIVITAMIN WITH MINERALS) TABS tablet Take 1 tablet by mouth daily.    Marland Kitchen olmesartan-hydrochlorothiazide (BENICAR HCT) 20-12.5 MG tablet Take 1 tablet by mouth daily.    Marland Kitchen omeprazole (PRILOSEC) 40 MG capsule Take 40 mg by mouth daily.    . rosuvastatin (CRESTOR) 10 MG tablet Take 10 mg by mouth daily.    Marland Kitchen aspirin EC 81 MG tablet Take 81 mg by mouth daily. Swallow whole.    . budesonide-formoterol (SYMBICORT) 80-4.5 MCG/ACT inhaler Inhale 2 puffs into the lungs 2 (two) times daily as needed (shortness of breath).      Drug Regimen Review  Drug regimen was reviewed and remains appropriate with no significant issues identified  Home: Home Living Family/patient expects to be discharged to:: Inpatient rehab Living Arrangements: Spouse/significant other Available Help at Discharge: Family,Available 24 hours/day Type of Home: House Home Access: Stairs to enter [employer] of Steps: 3 Entrance Stairs-Rails: Can reach both Home Layout: One level Bathroom Shower/Tub: Health visitor: Handicapped height Bathroom Accessibility: Yes Home Equipment: Environmental consultant - 2 wheels,Cane - single point Additional Comments: "unsteady grab bar in shower"   Functional History: Prior Function Level of Independence: Independent with assistive device(s) Comments: use of SPC, slow gait and L foot drag  Functional Status:  Mobility: Bed Mobility Overal bed mobility: Needs Assistance Bed Mobility: Rolling,Sidelying to Sit Rolling: Min assist Sidelying to sit: Mod assist General bed mobility  comments: pt in recliner upon arrival Transfers Overall transfer level: Needs assistance Equipment used:  (eva walker) Transfers: Sit to/from Stand Sit to Stand: Min assist Stand pivot transfers: Max assist,+2 physical assistance,+2 safety/equipment,From elevated surface,Mod assist General transfer comment: max directional verbal cues, for hand placement and to complete in slow/controled movement, pt able to achieve full standing with minA for mid stance during transition Ambulation/Gait Ambulation/Gait assistance: Max assist,+2 safety/equipment (chair follow) Gait Distance (Feet): 35 Feet Assistive device:  (eva walker) Gait Pattern/deviations: Step-through pattern,Decreased stance time - left,Decreased step length - left,Decreased dorsiflexion - left,Decreased dorsiflexion - right,Decreased stride length,Decreased weight shift to right,Narrow base of support,Ataxic General Gait Details: pt requiring max verbal cues to stand up right, shift weight to the R to initiated L LE advancement. Pt continues to require maxA for advancement of L LE in optimal pattern however with optimal positioning pt is now able to advance in an ataxic pattern and minial foot clearance 25% of time, R LE advancement progressing well, continue to use EVA  walker for truncal support Gait velocity: slow Gait velocity interpretation: <1.8 ft/sec, indicate of risk for recurrent falls    ADL: ADL Overall ADL's : Needs assistance/impaired Eating/Feeding: Set up,Sitting Eating/Feeding Details (indicate cue type and reason): pt able to hold cup in L hand to bring to mouth with straw Grooming: Min guard,Sitting Upper Body Bathing: Minimal assistance,Sitting Lower Body Bathing: Maximal assistance,Sitting/lateral leans,Sit to/from stand Upper Body Dressing : Moderate assistance,Sitting Upper Body Dressing Details (indicate cue type and reason): requiring assist for applying C-collar Lower Body Dressing: Maximal  assistance,Cueing for safety,Sitting/lateral leans,Sit to/from stand,+2 for physical assistance Lower Body Dressing Details (indicate cue type and reason): totalA for donning socks; pt could pull underwear from knees to waist Toilet Transfer: Maximal assistance,+2 for physical assistance,+2 for safety/equipment,Stand-pivot Toilet Transfer Details (indicate cue type and reason): +2 assist simulation to recliner towards R side; PT blocking R knee and 1 OT blocking L knee, but pt hyperextending L knee with wt bear and movement Toileting- Clothing Manipulation and Hygiene: Maximal assistance,+2 for physical assistance,+2 for safety/equipment,Sitting/lateral lean,Sit to/from stand,Cueing for safety Toileting - Clothing Manipulation Details (indicate cue type and reason): requiring assist Functional mobility during ADLs: Maximal assistance,Cueing for safety,Cueing for sequencing (Sit to stand with +1 therapist on R side and then L side; pt stood the best with OTR on R side and EVA walker in front. pt standing for x1 mins then x2 mins with EVA walker) General ADL Comments: pt continues to be limited by tone and spasticity in LLE, but able to bear weight and less hyperextension noted today; EVA walker assisting with pt standing upright. Pt continues to require assist with UB and LB ADL.  Cognition: Cognition Overall Cognitive Status: Within Functional Limits for tasks assessed Orientation Level: Oriented X4 Cognition Arousal/Alertness: Awake/alert Behavior During Therapy: Impulsive (pt eager to get moving and does things prematurely) Overall Cognitive Status: Within Functional Limits for tasks assessed General Comments: mildly impulsive due to very motivated, some decreased insight to safety   Blood pressure 112/75, pulse 67, temperature (!) 97.5 F (36.4 C), temperature source Oral, resp. rate 17, height 5\' 11"  (1.803 m), weight 88 kg, SpO2 99 %. Physical Exam Vitals and nursing note reviewed.   Constitutional:      General: He is not in acute distress.    Appearance: Normal appearance. He is not ill-appearing.  HENT:     Head: Normocephalic and atraumatic.     Right Ear: External ear normal.     Left Ear: External ear normal.     Nose: Nose normal.  Eyes:     General:        Right eye: No discharge.        Left eye: No discharge.     Extraocular Movements: Extraocular movements intact.  Neck:     Comments: + C-collar. Cardiovascular:     Rate and Rhythm: Normal rate and regular rhythm.  Pulmonary:     Effort: Pulmonary effort is normal. No respiratory distress.     Breath sounds: No stridor.  Abdominal:     General: Abdomen is flat. Bowel sounds are normal. There is no distension.  Musculoskeletal:        General: Swelling (1+ pedal edema bilaterally) present.     Comments: Left lower extremity tenderness  Skin:    Comments: Neck incision CDI with steri strips in place.   Neurological:     Mental Status: He is alert.     Comments: Alert and oriented  Motor: RUE: 5/5  proximal distal LUE: 4 -/5 proximal to distal RLE: 4 --4/5 proximal distal LLE: 3+-4 -/5 hip flexion, knee extension, 2/5 ankle dorsiflexion Sensation diminished light touch LUE/LLE  Psychiatric:        Mood and Affect: Mood normal.        Behavior: Behavior normal.        Thought Content: Thought content normal.     No results found for this or any previous visit (from the past 48 hour(s)). No results found.  Medical Problem List and Plan: 1.  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  -patient may not shower  -ELOS/Goals: 14-18 days/supervision/min a  Admit to CIR Improved 2.  Antithrombotics: -DVT/anticoagulation:  Mechanical: Sequential compression devices, below knee Bilateral lower extremities  -antiplatelet therapy: N/a 3. Pain Management: On oxycodone prn.    Monitor with increased exertion,  particularly for neuropathic pain 4. Mood: LCSW to follow for evaluation and support.   -antipsychotic agents: N/A 5. Neuropsych: This patient is capable of making decisions on his own behalf. 6. Skin/Wound Care: Routine pressure relief measures 7. Fluids/Electrolytes/Nutrition: Monitor I/Os  CMP ordered for tomorrow a.m. 8. HTN: Monitor BP tid--continue HCTZ and Avapro. Monitor for orthostasis.   Monitor with increased mobility. 9. GERD: Resume PPI.  10. Hypothyroid: Continue supplement 11. COPD: Encourage pulmonary hygiene with IS and monitor respiratory status with increase in activity. Continue Dulera with albuterol prn SOB. .  12. Spasticity: Baclofen tid-->may need titration upwards to help manage symptoms.   Jacquelynn Cree, PA-C 08/14/2020  I have personally performed a face to face diagnostic evaluation, including, but not limited to relevant history and physical exam findings, of this patient and developed relevant assessment and plan.  Additionally, I have reviewed and concur with the physician assistant's documentation above.  Maryla Morrow, MD, ABPMR

## 2020-08-14 NOTE — Progress Notes (Signed)
Physical Therapy Treatment Patient Details Name: Keith Downs MRN: [id number] DOB: [birth year] Today's Date: 08/14/2020    History of Present Illness Pt is a 74 male s/p arm pain, numbness and gait distubance. Pt is requiring decompressive anterior cervical discectomy C3-4;Decompressive anterior cervical corpectomy; Anterior cervical plating C3-6. PMHx:HTN, back sx, L foot drop.    PT Comments    Pt with significant improvement in L LE strength compared to yesterday, pt able to achieve full heel slide in sitting position on L today. Pt with improved L hip ABD in sitting as well. Pt with overall decreased tone in L LE however with increased with labored efforts ie. In walking. Pt beginning to advance L LE without assist but continue to require maxA for optimal position and to minimize ataxic/adduction of L LE during swing phase. Pt remains appropriate for CIR as he demonstrated excellent rehab potential, is very motivated, and was indep and working PTA. Acute PT to cont to follow.    Follow Up Recommendations  CIR;Supervision for mobility/OOB     Equipment Recommendations  Wheelchair (measurements PT);Wheelchair cushion (measurements PT)    Recommendations for Other Services Rehab consult     Precautions / Restrictions Precautions Precautions: Cervical;Fall Precaution Booklet Issued: Yes (comment) Precaution Comments: pt educated on precautions again, pt up in chair without neck brace, place c-collar on patient Required Braces or Orthoses: Cervical Brace Cervical Brace: Hard collar (when OOB) Restrictions Weight Bearing Restrictions: No    Mobility  Bed Mobility               General bed mobility comments: pt in recliner upon arrival  Transfers Overall transfer level: Needs assistance Equipment used:  (eva walker) Transfers: Sit to/from Stand Sit to Stand: Min assist         General transfer comment: max directional verbal cues, for hand placement and to  complete in slow/controled movement, pt able to achieve full standing with minA for mid stance during transition  Ambulation/Gait Ambulation/Gait assistance: Max assist;+2 safety/equipment (chair follow) Gait Distance (Feet): 35 Feet Assistive device:  (eva walker) Gait Pattern/deviations: Step-through pattern;Decreased stance time - left;Decreased step length - left;Decreased dorsiflexion - left;Decreased dorsiflexion - right;Decreased stride length;Decreased weight shift to right;Narrow base of support;Ataxic Gait velocity: slow Gait velocity interpretation: <1.8 ft/sec, indicate of risk for recurrent falls General Gait Details: pt requiring max verbal cues to stand up right, shift weight to the R to initiated L LE advancement. Pt continues to require maxA for advancement of L LE in optimal pattern however with optimal positioning pt is now able to advance in an ataxic pattern and minial foot clearance 25% of time, R LE advancement progressing well, continue to use EVA walker for truncal support   Stairs             Wheelchair Mobility    Modified Rankin (Stroke Patients Only)       Balance Overall balance assessment: Needs assistance Sitting-balance support: Feet supported;Bilateral upper extremity supported Sitting balance-Leahy Scale: Poor Sitting balance - Comments: pt continues to require bilat UE support however is working towards maintaining midline position Postural control: Posterior lean Standing balance support: During functional activity Standing balance-Leahy Scale: Zero Standing balance comment: dep on EVA walker/external support                            Cognition Arousal/Alertness: Awake/alert Behavior During Therapy: Impulsive (pt eager to get moving and does things prematurely) Overall Cognitive  Status: Within Functional Limits for tasks assessed                                 General Comments: mildly impulsive due to very  motivated, some decreased insight to safety      Exercises General Exercises - Lower Extremity Ankle Circles/Pumps: PROM;Both;10 reps;Seated Long Arc Quad: AROM;10 reps;Seated;Both (less than 1/2 ROM on L side) Heel Slides: AROM;AAROM;Both;10 reps;Seated (able to initate heel slide on L, requires AA to bring heel to chair (used foot on towel), pt able to complete full ROM on R side without assist) Hip ABduction/ADduction: AAROM;Both;10 reps;Seated (AA stretch into ABDUCTION) Other Exercises Other Exercises: worked on sit to stand prep exercises by initiatiating powering up using more LEs than UEs, tactile cues at hips    General Comments General comments (skin integrity, edema, etc.): incision covered by dressing, no drainage      Pertinent Vitals/Pain Pain Assessment: No/denies pain    Home Living                      Prior Function            PT Goals (current goals can now be found in the care plan section) Acute Rehab PT Goals Patient Stated Goal: to walk PT Goal Formulation: With patient/family Time For Goal Achievement: 08/24/20 Potential to Achieve Goals: Good Progress towards PT goals: Progressing toward goals    Frequency    Min 5X/week      PT Plan Current plan remains appropriate    Co-evaluation              AM-PAC PT "6 Clicks" Mobility   Outcome Measure  Help needed turning from your back to your side while in a flat bed without using bedrails?: A Lot Help needed moving from lying on your back to sitting on the side of a flat bed without using bedrails?: A Lot Help needed moving to and from a bed to a chair (including a wheelchair)?: A Lot Help needed standing up from a chair using your arms (e.g., wheelchair or bedside chair)?: Total Help needed to walk in hospital room?: Total Help needed climbing 3-5 steps with a railing? : Total 6 Click Score: 9    End of Session Equipment Utilized During Treatment: Gait belt;Cervical  collar Activity Tolerance: Patient tolerated treatment well Patient left: in chair;with call bell/phone within reach;with family/visitor present Nurse Communication: Mobility status PT Visit Diagnosis: Muscle weakness (generalized) (M62.81);Unsteadiness on feet (R26.81)     Time: 1610-9604 PT Time Calculation (min) (ACUTE ONLY): 40 min  Charges:  $Gait Training: 8-22 mins $Therapeutic Exercise: 8-22 mins $Neuromuscular Re-education: 8-22 mins                     Lewis Shock, PT, DPT Acute Rehabilitation Services Pager #: [phone] Office #: [phone]    Iona Hansen 08/14/2020, 10:46 AM

## 2020-08-14 NOTE — H&P (Signed)
Physical Medicine and Rehabilitation Admission H&P    CC: Cervical myelopathy with functional deficits.   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  History taken from chart review and patient.  He failed conservative management and was admitted on 08/09/2021 for ACDF of C3-C6 with arthrodesis of C3-C4 and C4-C6 by Dr. Yetta Barre. Post surgery in PACU he was found to have LUE>RUE weakness, flaccid LLE and RLE weakness with increased tone. MRI C spine was negative for hematoma and showed interval decompression with improvement in spinal stenosis. Weakness felt to be due to cord reexpansion after severe compression and he was started on Decadron with slow improvement.  Baclofen was added for spasticity and foley in place due to neurogenic bladder. Therapy has been ongoing and he continues to be limited by ataxia with bilateral lower extremity weakness, DOE and LUE weakness affecting ADLs. CIR recommended due to functional decline.  Please see preadmission assessment from earlier today as well.  Review of Systems  Constitutional: Negative for chills and fever.  HENT: Negative for hearing loss and tinnitus.   Eyes: Negative for blurred vision and double vision.  Respiratory: Negative for cough and shortness of breath.   Cardiovascular: Negative for chest pain and palpitations.  Gastrointestinal: Negative for constipation, heartburn and nausea.  Genitourinary: Negative for dysuria and urgency.  Musculoskeletal: Positive for myalgias and neck pain.  Skin: Negative for rash.  Neurological: Positive for sensory change, focal weakness and weakness. Negative for dizziness and headaches.  All other systems reviewed and are negative.   Past Medical History:  Diagnosis Date  . Ambulates with cane   . Anemia   . Arthritis     back, knee  . Asthma   . BPH (benign prostatic hyperplasia)   . ED (erectile dysfunction)   . Foot drop, left    some in right foot  . GERD (gastroesophageal reflux disease)   . Headache   . History of hiatal hernia    patient denied this dx  . HLD (hyperlipidemia)   . Hypertension   . Hypothyroidism   . Neuromuscular disorder (HCC)    numbness in right hand  . Peripheral vascular disease (HCC)   . Seasonal allergies   . Valvular heart disease    followed by [facility] cardiology    Past Surgical History:  Procedure Laterality Date  . ANTERIOR CERVICAL CORPECTOMY N/A 08/09/2020   Procedure: Corpectomy Cervical five with anterior plating Cervical three-Cervical six;  Surgeon: Tia Alert, MD;  Location: [facility] OR;  Service: Neurosurgery;  Laterality: N/A;  . ANTERIOR CERVICAL DECOMP/DISCECTOMY FUSION N/A 08/09/2020   Procedure: Anterior Cervical Decompression Fusion  - Cervical three-Cervical four;  Surgeon: Tia Alert, MD;  Location: [facility] OR;  Service: Neurosurgery;  Laterality: N/A;  . BACK SURGERY     L5-S1  . CARDIAC CATHETERIZATION  2019  . COLONOSCOPY     multiple  . cyst removal     from head  . HAND SURGERY  1982  . HEMORRHOID SURGERY    . HERNIA REPAIR Bilateral   . PROSTATE BIOPSY    . UPPER GI ENDOSCOPY     multiple  . VEIN SURGERY  2019    Family History  Problem Relation Age of Onset  . Colon cancer Mother      Social  History: Married. Independent and working in [employer].  He reports that he has never smoked. He has never used smokeless tobacco. He reports current alcohol use of about 6.0 standard drinks of alcohol per week. He reports that he does not use drugs.    Allergies  Allergen Reactions  . Niacin And Related     Face flushed    Medications Prior to Admission  Medication Sig Dispense Refill  . albuterol (VENTOLIN HFA) 108 (90 Base) MCG/ACT inhaler Inhale 2 puffs into the lungs every 6 (six) hours as needed for wheezing or  shortness of breath.    . fluticasone (FLONASE) 50 MCG/ACT nasal spray Place 2 sprays into both nostrils daily.    Marland Kitchen gabapentin (NEURONTIN) 300 MG capsule Take 300 mg by mouth 3 (three) times daily.    Marland Kitchen ibuprofen (ADVIL) 800 MG tablet Take 800 mg by mouth 3 (three) times daily.    Marland Kitchen levothyroxine (SYNTHROID) 125 MCG tablet Take 125 mcg by mouth daily before breakfast.    . Multiple Vitamin (MULTIVITAMIN WITH MINERALS) TABS tablet Take 1 tablet by mouth daily.    Marland Kitchen olmesartan-hydrochlorothiazide (BENICAR HCT) 20-12.5 MG tablet Take 1 tablet by mouth daily.    Marland Kitchen omeprazole (PRILOSEC) 40 MG capsule Take 40 mg by mouth daily.    . rosuvastatin (CRESTOR) 10 MG tablet Take 10 mg by mouth daily.    Marland Kitchen aspirin EC 81 MG tablet Take 81 mg by mouth daily. Swallow whole.    . budesonide-formoterol (SYMBICORT) 80-4.5 MCG/ACT inhaler Inhale 2 puffs into the lungs 2 (two) times daily as needed (shortness of breath).      Drug Regimen Review  Drug regimen was reviewed and remains appropriate with no significant issues identified  Home: Home Living Family/patient expects to be discharged to:: Inpatient rehab Living Arrangements: Spouse/significant other Available Help at Discharge: Family,Available 24 hours/day Type of Home: House Home Access: Stairs to enter [employer] of Steps: 3 Entrance Stairs-Rails: Can reach both Home Layout: One level Bathroom Shower/Tub: Health visitor: Handicapped height Bathroom Accessibility: Yes Home Equipment: Environmental consultant - 2 wheels,Cane - single point Additional Comments: "unsteady grab bar in shower"   Functional History: Prior Function Level of Independence: Independent with assistive device(s) Comments: use of SPC, slow gait and L foot drag  Functional Status:  Mobility: Bed Mobility Overal bed mobility: Needs Assistance Bed Mobility: Rolling,Sidelying to Sit Rolling: Min assist Sidelying to sit: Mod assist General bed mobility  comments: pt in recliner upon arrival Transfers Overall transfer level: Needs assistance Equipment used:  (eva walker) Transfers: Sit to/from Stand Sit to Stand: Min assist Stand pivot transfers: Max assist,+2 physical assistance,+2 safety/equipment,From elevated surface,Mod assist General transfer comment: max directional verbal cues, for hand placement and to complete in slow/controled movement, pt able to achieve full standing with minA for mid stance during transition Ambulation/Gait Ambulation/Gait assistance: Max assist,+2 safety/equipment (chair follow) Gait Distance (Feet): 35 Feet Assistive device:  (eva walker) Gait Pattern/deviations: Step-through pattern,Decreased stance time - left,Decreased step length - left,Decreased dorsiflexion - left,Decreased dorsiflexion - right,Decreased stride length,Decreased weight shift to right,Narrow base of support,Ataxic General Gait Details: pt requiring max verbal cues to stand up right, shift weight to the R to initiated L LE advancement. Pt continues to require maxA for advancement of L LE in optimal pattern however with optimal positioning pt is now able to advance in an ataxic pattern and minial foot clearance 25% of time, R LE advancement progressing well, continue to use EVA  walker for truncal support Gait velocity: slow Gait velocity interpretation: <1.8 ft/sec, indicate of risk for recurrent falls    ADL: ADL Overall ADL's : Needs assistance/impaired Eating/Feeding: Set up,Sitting Eating/Feeding Details (indicate cue type and reason): pt able to hold cup in L hand to bring to mouth with straw Grooming: Min guard,Sitting Upper Body Bathing: Minimal assistance,Sitting Lower Body Bathing: Maximal assistance,Sitting/lateral leans,Sit to/from stand Upper Body Dressing : Moderate assistance,Sitting Upper Body Dressing Details (indicate cue type and reason): requiring assist for applying C-collar Lower Body Dressing: Maximal  assistance,Cueing for safety,Sitting/lateral leans,Sit to/from stand,+2 for physical assistance Lower Body Dressing Details (indicate cue type and reason): totalA for donning socks; pt could pull underwear from knees to waist Toilet Transfer: Maximal assistance,+2 for physical assistance,+2 for safety/equipment,Stand-pivot Toilet Transfer Details (indicate cue type and reason): +2 assist simulation to recliner towards R side; PT blocking R knee and 1 OT blocking L knee, but pt hyperextending L knee with wt bear and movement Toileting- Clothing Manipulation and Hygiene: Maximal assistance,+2 for physical assistance,+2 for safety/equipment,Sitting/lateral lean,Sit to/from stand,Cueing for safety Toileting - Clothing Manipulation Details (indicate cue type and reason): requiring assist Functional mobility during ADLs: Maximal assistance,Cueing for safety,Cueing for sequencing (Sit to stand with +1 therapist on R side and then L side; pt stood the best with OTR on R side and EVA walker in front. pt standing for x1 mins then x2 mins with EVA walker) General ADL Comments: pt continues to be limited by tone and spasticity in LLE, but able to bear weight and less hyperextension noted today; EVA walker assisting with pt standing upright. Pt continues to require assist with UB and LB ADL.  Cognition: Cognition Overall Cognitive Status: Within Functional Limits for tasks assessed Orientation Level: Oriented X4 Cognition Arousal/Alertness: Awake/alert Behavior During Therapy: Impulsive (pt eager to get moving and does things prematurely) Overall Cognitive Status: Within Functional Limits for tasks assessed General Comments: mildly impulsive due to very motivated, some decreased insight to safety   Blood pressure 112/75, pulse 67, temperature (!) 97.5 F (36.4 C), temperature source Oral, resp. rate 17, height 5\' 11"  (1.803 m), weight 88 kg, SpO2 99 %. Physical Exam Vitals and nursing note reviewed.   Constitutional:      General: He is not in acute distress.    Appearance: Normal appearance. He is not ill-appearing.  HENT:     Head: Normocephalic and atraumatic.     Right Ear: External ear normal.     Left Ear: External ear normal.     Nose: Nose normal.  Eyes:     General:        Right eye: No discharge.        Left eye: No discharge.     Extraocular Movements: Extraocular movements intact.  Neck:     Comments: + C-collar. Cardiovascular:     Rate and Rhythm: Normal rate and regular rhythm.  Pulmonary:     Effort: Pulmonary effort is normal. No respiratory distress.     Breath sounds: No stridor.  Abdominal:     General: Abdomen is flat. Bowel sounds are normal. There is no distension.  Musculoskeletal:        General: Swelling (1+ pedal edema bilaterally) present.     Comments: Left lower extremity tenderness  Skin:    Comments: Neck incision CDI with steri strips in place.   Neurological:     Mental Status: He is alert.     Comments: Alert and oriented  Motor: RUE: 5/5  proximal distal LUE: 4 -/5 proximal to distal RLE: 4 --4/5 proximal distal LLE: 3+-4 -/5 hip flexion, knee extension, 2/5 ankle dorsiflexion Sensation diminished light touch LUE/LLE  Psychiatric:        Mood and Affect: Mood normal.        Behavior: Behavior normal.        Thought Content: Thought content normal.     No results found for this or any previous visit (from the past 48 hour(s)). No results found.  Medical Problem List and Plan: 1.  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  -patient may not shower  -ELOS/Goals: 14-18 days/supervision/min a  Admit to CIR Improved 2.  Antithrombotics: -DVT/anticoagulation:  Mechanical: Sequential compression devices, below knee Bilateral lower extremities  -antiplatelet therapy: N/a 3. Pain Management: On oxycodone prn.    Monitor with increased exertion,  particularly for neuropathic pain 4. Mood: LCSW to follow for evaluation and support.   -antipsychotic agents: N/A 5. Neuropsych: This patient is capable of making decisions on his own behalf. 6. Skin/Wound Care: Routine pressure relief measures 7. Fluids/Electrolytes/Nutrition: Monitor I/Os  CMP ordered for tomorrow a.m. 8. HTN: Monitor BP tid--continue HCTZ and Avapro. Monitor for orthostasis.   Monitor with increased mobility. 9. GERD: Resume PPI.  10. Hypothyroid: Continue supplement 11. COPD: Encourage pulmonary hygiene with IS and monitor respiratory status with increase in activity. Continue Dulera with albuterol prn SOB. .  12. Spasticity: Baclofen tid-->may need titration upwards to help manage symptoms.   Jacquelynn Cree, PA-C 08/14/2020  I have personally performed a face to face diagnostic evaluation, including, but not limited to relevant history and physical exam findings, of this patient and developed relevant assessment and plan.  Additionally, I have reviewed and concur with the physician assistant's documentation above.  Maryla Morrow, MD, ABPMR The patient's status has not changed. Any changes from the pre-admission screening or documentation from the acute chart are noted above.   Maryla Morrow, MD, ABPMR

## 2020-08-14 NOTE — Progress Notes (Signed)
Continues to improve, great spirits, using L hand better, pinching the mayo packet to hold it while he tears it open with his R hand, walked again, DF/PF 3/5 L which is better, very spastic. incision CDI, in collar, no pain.

## 2020-08-14 NOTE — Plan of Care (Signed)
Patient discharged to rehab per order. Patient alert and oriented with no c/o pain or distress. Incision on neck CDI, no s/ of infection or swelling noted.

## 2020-08-14 NOTE — Progress Notes (Signed)
Inpatient Rehabilitation Admissions Coordinator  Insurance has approved and semiprivate male room available for admit today. I met with patient at bedside and she is in agreement to admit. I contacted Dr. Ronnald Ramp, acute team and TOC. I will make the arrangements to admit today.  Danne Baxter, RN, MSN Rehab Admissions Coordinator [phone] 08/14/2020 10:51 AM

## 2020-08-15 ENCOUNTER — Inpatient Hospital Stay (HOSPITAL_COMMUNITY): Payer: BC Managed Care – PPO

## 2020-08-15 ENCOUNTER — Inpatient Hospital Stay (HOSPITAL_COMMUNITY): Payer: BC Managed Care – PPO | Admitting: Occupational Therapy

## 2020-08-15 DIAGNOSIS — G959 Disease of spinal cord, unspecified: Secondary | ICD-10-CM

## 2020-08-15 LAB — URINALYSIS, ROUTINE W REFLEX MICROSCOPIC
Bilirubin Urine: NEGATIVE
Glucose, UA: NEGATIVE mg/dL
Hgb urine dipstick: NEGATIVE
Ketones, ur: NEGATIVE mg/dL
Leukocytes,Ua: NEGATIVE
Nitrite: NEGATIVE
Protein, ur: NEGATIVE mg/dL
Specific Gravity, Urine: 1.014 (ref 1.005–1.030)
pH: 5 (ref 5.0–8.0)

## 2020-08-15 LAB — COMPREHENSIVE METABOLIC PANEL
ALT: 30 U/L (ref 0–44)
AST: 23 U/L (ref 15–41)
Albumin: 4.1 g/dL (ref 3.5–5.0)
Alkaline Phosphatase: 58 U/L (ref 38–126)
Anion gap: 13 (ref 5–15)
BUN: 29 mg/dL — ABNORMAL HIGH (ref 6–20)
CO2: 23 mmol/L (ref 22–32)
Calcium: 9.2 mg/dL (ref 8.9–10.3)
Chloride: 100 mmol/L (ref 98–111)
Creatinine, Ser: 0.88 mg/dL (ref 0.61–1.24)
GFR, Estimated: >60 mL/min (ref >60)
Glucose, Bld: 123 mg/dL — ABNORMAL HIGH (ref 70–99)
Potassium: 4 mmol/L (ref 3.5–5.1)
Sodium: 136 mmol/L (ref 135–145)
Total Bilirubin: 1.7 mg/dL — ABNORMAL HIGH (ref 0.3–1.2)
Total Protein: 6.5 g/dL (ref 6.5–8.1)

## 2020-08-15 LAB — CBC WITH DIFFERENTIAL/PLATELET
Abs Immature Granulocytes: 0.18 10*3/uL — ABNORMAL HIGH (ref 0.00–0.07)
Basophils Absolute: 0 10*3/uL (ref 0.0–0.1)
Basophils Relative: 0 %
Eosinophils Absolute: 0 10*3/uL (ref 0.0–0.5)
Eosinophils Relative: 0 %
HCT: 42 % (ref 39.0–52.0)
Hemoglobin: 15.3 g/dL (ref 13.0–17.0)
Immature Granulocytes: 1 %
Lymphocytes Relative: 7 %
Lymphs Abs: 1 10*3/uL (ref 0.7–4.0)
MCH: 31.4 pg (ref 26.0–34.0)
MCHC: 36.4 g/dL — ABNORMAL HIGH (ref 30.0–36.0)
MCV: 86.1 fL (ref 80.0–100.0)
Monocytes Absolute: 0.9 10*3/uL (ref 0.1–1.0)
Monocytes Relative: 6 %
Neutro Abs: 11.4 10*3/uL — ABNORMAL HIGH (ref 1.7–7.7)
Neutrophils Relative %: 86 %
Platelets: 251 10*3/uL (ref 150–400)
RBC: 4.88 MIL/uL (ref 4.22–5.81)
RDW: 12.4 % (ref 11.5–15.5)
WBC: 13.4 10*3/uL — ABNORMAL HIGH (ref 4.0–10.5)
nRBC: 0 % (ref 0.0–0.2)

## 2020-08-15 MED ORDER — BACLOFEN 10 MG PO TABS
10.0000 mg | ORAL_TABLET | Freq: Four times a day (QID) | ORAL | Status: DC
  Administered 2020-08-15 – 2020-08-30 (×61): 10 mg via ORAL
  Filled 2020-08-15 (×61): qty 1

## 2020-08-15 MED ORDER — TIZANIDINE HCL 4 MG PO TABS: 4.0000 mg | ORAL_TABLET | Freq: Three times a day (TID) | ORAL | Status: DC | PRN

## 2020-08-15 MED ORDER — ENOXAPARIN SODIUM 40 MG/0.4ML ~~LOC~~ SOLN
40.0000 mg | SUBCUTANEOUS | Status: DC
  Administered 2020-08-16 – 2020-08-30 (×15): 40 mg via SUBCUTANEOUS
  Filled 2020-08-15 (×15): qty 0.4

## 2020-08-15 NOTE — Progress Notes (Signed)
Inpatient Rehabilitation  Patient information reviewed and entered into eRehab system by Obryan Radu M. Ivee Poellnitz, M.A., CCC/SLP, PPS Coordinator.  Information including medical coding, functional ability and quality indicators will be reviewed and updated through discharge.    

## 2020-08-15 NOTE — Progress Notes (Signed)
Orthopedic Tech Progress Note Patient Details:  Zaevion Parke 16-Jun-1961 [id number] Patient was up in the chair. Dropped off in room Ortho Devices Type of Ortho Device: Prafo boot/shoe Ortho Device/Splint Location: LLE Ortho Device/Splint Interventions: Ordered   Post Interventions Patient Tolerated: Other (comment) Instructions Provided: Other (comment)   Michelle Piper 08/15/2020, 4:44 PM

## 2020-08-15 NOTE — Progress Notes (Signed)
[place] PHYSICAL MEDICINE & REHABILITATION PROGRESS NOTE   Subjective/Complaints:  Pt on Decadron, and WBC up to 13.4- will check U/A and Cx just to make sure UTI not cause of leukocytosis- likely the steroids.   Pt reports LLE is becoming "tighter" and cramping a lot more,  and harder to move - Baclofen 5 mg TID has been there for a few days- wondering what to do next.   Feels like needs a shower and to exercise.     ROS:  Pt denies SOB, abd pain, CP, N/V/C/D, and vision changes   Objective:   No results found. Recent Labs    08/15/20 0839  WBC 13.4*  HGB 15.3  HCT 42.0  PLT 251   No results for input(s): NA, K, CL, CO2, GLUCOSE, BUN, CREATININE, CALCIUM in the last 72 hours.  Intake/Output Summary (Last 24 hours) at 08/15/2020 1119 Last data filed at 08/15/2020 1100 Gross per 24 hour  Intake 240 ml  Output 1400 ml  Net -1160 ml        Physical Exam: Vital Signs Blood pressure 127/84, pulse 63, temperature 98 F (36.7 C), temperature source Oral, resp. rate 17, SpO2 99 %.  Constitutional:  awake, alert, sitting in bedside chair, appropriate, NAD HENT: conjugate gaze- wearing Aspen collar; steristrips on R anterior neck- area slightly swollen- no erythema  Neck:     Comments: + C-collar. Cardiovascular: RRR- no JVD Pulmonary: CTA B/L- no W/R/R- good air movement  Abdominal: Soft, NT, ND, (+)BS  Musculoskeletal:        General: Swelling (1+ pedal edema bilaterally) present.     Comments: Left lower extremity tenderness  Skin:    Comments: Neck incision CDI with steri strips in place.   Neurological:  LLE MAS of 3-4- very difficult to range through normal ROM- very tight with a few spasms seen; (+) clonus    Mental Status: He is alert.     Comments: Alert and oriented  Motor: RUE: 5/5 proximal distal LUE: 4 -/5 proximal to distal RLE: 4 --4/5 proximal distal LLE: 3+-4 -/5 hip flexion, knee extension, 2/5 ankle dorsiflexion Sensation diminished light  touch LUE/LLE  Psychiatric:    appropriate    Assessment/Plan: 1. Functional deficits which require 3+ hours per day of interdisciplinary therapy in a comprehensive inpatient rehab setting.  Physiatrist is providing close team supervision and 24 hour management of active medical problems listed below.  Physiatrist and rehab team continue to assess barriers to discharge/monitor patient progress toward functional and medical goals  Care Tool:  Bathing              Bathing assist       Upper Body Dressing/Undressing Upper body dressing        Upper body assist      Lower Body Dressing/Undressing Lower body dressing            Lower body assist       Toileting Toileting    Toileting assist Assist for toileting: Moderate Assistance - Patient 50 - 74%     Transfers Chair/bed transfer  Transfers assist     Chair/bed transfer assist level: Maximal Assistance - Patient 25 - 49%     Locomotion Ambulation   Ambulation assist              Walk 10 feet activity   Assist           Walk 50 feet activity   Assist  Walk 150 feet activity   Assist           Walk 10 feet on uneven surface  activity   Assist           Wheelchair     Assist               Wheelchair 50 feet with 2 turns activity    Assist            Wheelchair 150 feet activity     Assist          Blood pressure 127/84, pulse 63, temperature 98 F (36.7 C), temperature source Oral, resp. rate 17, SpO2 99 %.  Medical Problem List and Plan: 1.  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             -patient may not shower             -ELOS/Goals: 14-18 days/supervision/min a             Admit to CIR Improved 2.  Antithrombotics: -DVT/anticoagulation:  Mechanical: Sequential compression devices, below knee Bilateral lower  extremities 1/20- will start Lovenox tomorrow- will be 7 days since surgery             -antiplatelet therapy: N/a 3. Pain Management: On oxycodone prn.    1/20- pain controlled- spasticity is not- as below             Monitor with increased exertion, particularly for neuropathic pain 4. Mood: LCSW to follow for evaluation and support.              -antipsychotic agents: N/A 5. Neuropsych: This patient is capable of making decisions on his own behalf. 6. Skin/Wound Care: Routine pressure relief measures 7. Fluids/Electrolytes/Nutrition: Monitor I/Os             CMP ordered for tomorrow a.m. 8. HTN: Monitor BP tid--continue HCTZ and Avapro. Monitor for orthostasis.   1/20- BP 127/84- doing well -con't regimen             Monitor with increased mobility. 9. GERD: Resume PPI.  10. Hypothyroid: Continue supplement 11. COPD: Encourage pulmonary hygiene with IS and monitor respiratory status with increase in activity. Continue Dulera with albuterol prn SOB. .  12. Spasticity: Baclofen tid-->may need titration upwards to help manage symptoms.   1/20- will increase Baclofen to 10 mg QID and then add prn Zanaflex 4 mg TID prn for increasing spasms, esp at night- explained can cause sedation/constipaiton. Of note, spasticity worse at night.  13. Leukocytosis  1/20- WBC 13.4- but on decadron- will check U/A and Cx to make sure doesn't have UTI.     LOS: 1 days A FACE TO FACE EVALUATION WAS PERFORMED  Elmyra Banwart 08/15/2020, 11:19 AM

## 2020-08-15 NOTE — Progress Notes (Signed)
Physical Therapy Session Note  Patient Details  Name: Keith Downs MRN: [id number] Date of Birth: [birth year]  Today's Date: 08/15/2020 PT Individual Time: 5993-5701 PT Individual Time Calculation (min): 80 min   Short Term Goals: Week 1:  PT Short Term Goal 1 (Week 1): Pt will compete bed mobility with minA and no bed features PT Short Term Goal 2 (Week 1): Pt will complete bed<>chair transfers with minA and LRAD PT Short Term Goal 3 (Week 1): Pt will ambulate 42ft with modA and LRAD  Skilled Therapeutic Interventions/Progress Updates:  Patient up in recliner, agreeable to PT.  Performed sit to stand to RW with mod to max lifting help.  Step to bed with RW and mod A to keep walker close and for weight shifts, sliding L foot to move it.  Stand to sit on bed uncontrolled descent with mod A.  Patient sit to supine with mod A.  Performed passive stretching and tone inhibition techniques to L LE for extensor tone with prolonged stretch.  Patient rolling with rail and S.  Side to sit with mod to max A pt using railing, but sequencing and pulling up on PT.  Patient sit to stand practice with cues and focus on anterior weight shift mod progressing to min A once bed height up to allow hips above knees x 5 reps.  Patient stand step to w/c with min to mod A safety/cues for L LE placement and walker management.  Assisted in w/c to therapy gym.  Performed sit to stand to RW with min A after cues for foot placement, anterior weight shift and hand position  Performed step taps with mod A to 4" step for L LE strength in stance and coordination.  Patient propelled w/c to ortho gym x 120' with S and cues for technique.  Performed squat pivot transfer to R to mat after demonstration and cues with CGA.  Patient seated EOM using mirror for feedback to work on L trunk activation with lateral weight shifts after demonstration, reaching to place clothes pins on cup to R with PT on ball behind pt and facilitation to  engage lateral trunk flexors on L.  Lateral lean on L elbow reaching up with R with facilitation again to engage L lateral trunk musculature.  Patient performed squat pivot back to w/c on L with cues and min A.  In w/c for arm bike x 2 minutes forward and 2 minutes in reverse.  Patient assisted in w/c to room.  Requesting to brush teeth and use restroom.  Seated at sink to brush teeth with S.  Transferred to toilet with Stedy per pt preference assist to don pants after at pt request.  Patient left seated in recliner with alarm belt active and needs in reach.   Therapy Documentation Precautions:  Precautions Precautions: Cervical,Fall Required Braces or Orthoses: Cervical Brace Cervical Brace: Hard collar Restrictions Weight Bearing Restrictions: No Other Position/Activity Restrictions: Hard collar when OOB, may apply sitting EOB Pain: Pain Assessment Pain Score: 4  Pain Type: Surgical pain Pain Location: Neck Pain Orientation: Posterior Pain Descriptors / Indicators: Tender;Sore Pain Onset: On-going Pain Intervention(s): Repositioned    Therapy/Group: Individual Therapy  Keith Downs  Keith Downs, PT 08/15/2020, 1:31 PM

## 2020-08-15 NOTE — Progress Notes (Signed)
Inpatient Rehabilitation Care Coordinator Assessment and Plan Patient Details  Name: Keith Downs MRN: [id number] Date of Birth: May 12, 1961  Today's Date: 08/15/2020  Hospital Problems: Principal Problem:   Cervical myelopathy [facility])  Past Medical History:  Past Medical History:  Diagnosis Date  . Ambulates with cane   . Anemia   . Arthritis    back, knee  . Asthma   . BPH (benign prostatic hyperplasia)   . ED (erectile dysfunction)   . Foot drop, left    some in right foot  . GERD (gastroesophageal reflux disease)   . Headache   . History of hiatal hernia    patient denied this dx  . HLD (hyperlipidemia)   . Hypertension   . Hypothyroidism   . Neuromuscular disorder (HCC)    numbness in right hand  . Peripheral vascular disease ([place])   . Seasonal allergies   . Valvular heart disease    followed by [facility] cardiology   Past Surgical History:  Past Surgical History:  Procedure Laterality Date  . ANTERIOR CERVICAL CORPECTOMY N/A 08/09/2020   Procedure: Corpectomy Cervical five with anterior plating Cervical three-Cervical six;  Surgeon: Eustace Moore, MD;  Location: [place];  Service: Neurosurgery;  Laterality: N/A;  . ANTERIOR CERVICAL DECOMP/DISCECTOMY FUSION N/A 08/09/2020   Procedure: Anterior Cervical Decompression Fusion  - Cervical three-Cervical four;  Surgeon: Eustace Moore, MD;  Location: [place];  Service: Neurosurgery;  Laterality: N/A;  . BACK SURGERY     L5-S1  . CARDIAC CATHETERIZATION  2019  . COLONOSCOPY     multiple  . cyst removal     from head  . HAND SURGERY  1982  . HEMORRHOID SURGERY    . HERNIA REPAIR Bilateral   . PROSTATE BIOPSY    . UPPER GI ENDOSCOPY     multiple  . VEIN SURGERY  2019   Social History:  reports that he has never smoked. He has never used smokeless tobacco. He reports current alcohol use of about 6.0 standard drinks of alcohol per week. He reports that he does not use drugs.  Family / Support  Systems Marital Status: Married How Long?: 34 years Patient Roles: Spouse Spouse/Significant Other: Keith Downs (wife): [phone] Children: 4 adult children. Pt reports children live locally. Other Supports: N/A Anticipated Caregiver: wife and children PRN Ability/Limitations of Caregiver: None reported Caregiver Availability: 24/7 Family Dynamics: Pt works FT and lives with wife. His children live around the corner.  Social History Preferred language: English Religion:  Cultural Background: Pt works FT as Designer, fashion/clothing at [employer]. Education: high school Read: Yes Write: Yes Employment Status: Employed Name of Employer: [employer] Length of Employment: 34 (years) Return to Work Plans: Pt woud like to return to work as soon as possible. Currenlty receives STD. Unsure if employer offers LTD. Legal History/Current Legal Issues: Denies Guardian/Conservator: N/A   Abuse/Neglect Abuse/Neglect Assessment Can Be Completed: Yes Physical Abuse: Denies Verbal Abuse: Denies Sexual Abuse: Denies Exploitation of patient/patient's resources: Denies Self-Neglect: Denies  Emotional Status Pt's affect, behavior and adjustment status: Pt in good spirits at time of visit Recent Psychosocial Issues: Denies Psychiatric History: Denies Substance Abuse History: Reports occassional EtoH ( a few beers); otherwise does not drink.  Patient / Family Perceptions, Expectations & Goals Pt/Family understanding of illness & functional limitations: Pt has general understanding of his care needs Premorbid pt/family roles/activities: Independent Anticipated changes in roles/activities/participation: Assistance with ADLs/IADLs Pt/family expectations/goals: Pt goal is work on "walking."  [place]  Agencies: None Premorbid Home Care/DME Agencies: None Transportation available at discharge: wife Resource referrals recommended: Neuropsychology  Discharge Planning Living Arrangements:  Spouse/significant other Support Systems: Spouse/significant other Type of Residence: Private residence Insurance Resources: Multimedia programmer (specify) Nurse, mental health) Financial Resources: [place] Referred: No Living Expenses: Medical laboratory scientific officer Management: Spouse Does the patient have any problems obtaining your medications?: No Home Management: Wife manages all homecare needs. Patient/Family Preliminary Plans: No changes Care Coordinator Anticipated Follow Up Needs: HH/OP  Clinical Impression SW met with pt in room to introduce self, explain role, and discuss discharge process. Pt is not a English as a second language teacher. No HCPOA. DME: access to RW and cane. Pt aware SW to follow-up with his wife to introduce self.   Dawson Albers A Vietta Bonifield 08/15/2020, 4:19 PM

## 2020-08-15 NOTE — Evaluation (Signed)
Physical Therapy Assessment and Plan  Patient Details  Name: Keith Downs MRN: [id number] Date of Birth: 1960-11-29  PT Diagnosis: Abnormality of gait, Ataxia, Ataxic gait, Difficulty walking, Hypertonia, Impaired sensation and Muscle weakness Rehab Potential: Good ELOS: 3 weeks   Today's Date: 08/15/2020 PT Individual Time: 1100-1200 PT Individual Time Calculation (min): 60 min    Hospital Problem: Principal Problem:   Cervical myelopathy ([place])   Past Medical History:  Past Medical History:  Diagnosis Date  . Ambulates with cane   . Anemia   . Arthritis    back, knee  . Asthma   . BPH (benign prostatic hyperplasia)   . ED (erectile dysfunction)   . Foot drop, left    some in right foot  . GERD (gastroesophageal reflux disease)   . Headache   . History of hiatal hernia    patient denied this dx  . HLD (hyperlipidemia)   . Hypertension   . Hypothyroidism   . Neuromuscular disorder (HCC)    numbness in right hand  . Peripheral vascular disease ([place])   . Seasonal allergies   . Valvular heart disease    followed by [facility] cardiology   Past Surgical History:  Past Surgical History:  Procedure Laterality Date  . ANTERIOR CERVICAL CORPECTOMY N/A 08/09/2020   Procedure: Corpectomy Cervical five with anterior plating Cervical three-Cervical six;  Surgeon: Eustace Moore, MD;  Location: [place];  Service: Neurosurgery;  Laterality: N/A;  . ANTERIOR CERVICAL DECOMP/DISCECTOMY FUSION N/A 08/09/2020   Procedure: Anterior Cervical Decompression Fusion  - Cervical three-Cervical four;  Surgeon: Eustace Moore, MD;  Location: [place];  Service: Neurosurgery;  Laterality: N/A;  . BACK SURGERY     L5-S1  . CARDIAC CATHETERIZATION  2019  . COLONOSCOPY     multiple  . cyst removal     from head  . HAND SURGERY  1982  . HEMORRHOID SURGERY    . HERNIA REPAIR Bilateral   . PROSTATE BIOPSY    . UPPER GI ENDOSCOPY     multiple  . VEIN SURGERY  2019    Assessment &  Plan Clinical Impression: Patient is a 60 year old male with history of HTN, BPH, neck pain, valvular disease, left foot drop,  progressive numbness and weakness with problems ambulating due to HNP C3/4 and C4/5 with C5/6 myelopathy.  History taken from chart review and patient.  He failed conservative management and was admitted on 08/09/2021 for ACDF of C3-C6 with arthrodesis of C3-C4 and C4-C6 by Dr. Ronnald Ramp. Post surgery in PACU he was found to have LUE>RUE weakness, flaccid LLE and RLE weakness with increased tone. MRI C spine was negative for hematoma and showed interval decompression with improvement in spinal stenosis. Weakness felt to be due to cord reexpansion after severe compression and he was started on Decadron with slow improvement.  Baclofen was added for spasticity and foley in place due to neurogenic bladder. Therapy has been ongoing and he continues to be limited by ataxia with bilateral lower extremity weakness, DOE and LUE weakness affecting ADLs. CIR recommended due to functional decline.  Patient transferred to CIR on 08/14/2020 .   Patient currently requires mod with mobility secondary to muscle weakness and muscle joint tightness, impaired timing and sequencing, abnormal tone, unbalanced muscle activation, motor apraxia and decreased coordination, and decreased standing balance and decreased balance strategies.  Prior to hospitalization, patient was modified independent  with mobility and lived with Spouse in a House home.  Home  access is 3 Stairs with bilateral rails to enter.  Patient will benefit from skilled PT intervention to maximize safe functional mobility, minimize fall risk and decrease caregiver burden for planned discharge home with 24 hour assist.  Anticipate patient will HHPT vs OPPT pending progress at discharge.  PT - End of Session Activity Tolerance: Tolerates 30+ min activity with multiple rests Endurance Deficit: Yes Endurance Deficit Description: Benefits from  brief seated rest for recovery PT Assessment Rehab Potential (ACUTE/IP ONLY): Good PT Barriers to Discharge: Home environment access/layout;Insurance for SNF coverage PT Patient demonstrates impairments in the following area(s): Balance;Endurance;Motor;Safety;Perception;Sensory;Skin Integrity PT Transfers Functional Problem(s): Bed Mobility;Bed to Chair;Car;Furniture PT Locomotion Functional Problem(s): Ambulation;Stairs PT Plan PT Intensity: Minimum of 1-2 x/day ,45 to 90 minutes PT Frequency: 5 out of 7 days PT Duration Estimated Length of Stay: 3 weeks PT Treatment/Interventions: Ambulation/gait training;Balance/vestibular training;Cognitive remediation/compensation;Community reintegration;Discharge planning;Disease management/prevention;DME/adaptive equipment instruction;Functional electrical stimulation;Neuromuscular re-education;Functional mobility training;Pain management;Patient/family education;Psychosocial support;Skin care/wound management;Splinting/orthotics;Stair training;Therapeutic Activities;Therapeutic Exercise;UE/LE Strength taining/ROM;UE/LE Coordination activities;Visual/perceptual remediation/compensation;Wheelchair propulsion/positioning PT Transfers Anticipated Outcome(s): supervision with LRAD PT Locomotion Anticipated Outcome(s): supervision with LRAD PT Recommendation Recommendations for Other Services: Neuropsych consult Follow Up Recommendations: 24 hour supervision/assistance Patient destination: Home Equipment Recommended: To be determined Equipment Details: Pt owns a SPC and RW  PT Evaluation Precautions/Restrictions Precautions Precautions: Cervical;Fall Required Braces or Orthoses: Cervical Brace Restrictions Weight Bearing Restrictions: No Other Position/Activity Restrictions: Hard collar when OOB, may apply sitting EOB General Chart Reviewed: Yes Additional Pertinent History: history of HTN, BPH, neck pain, valvular disease, left foot drop,   progressive numbness and weakness with problems ambulating due to HNP C3/4 and C4/5 with C5/6 myelopathy Family/Caregiver Present: No  Home Living/Prior Functioning Home Living Available Help at Discharge: Family;Available 24 hours/day (Wife can provide 24/7. Several family members live nearby who can assist as well) Type of Home: House Home Access: Stairs to enter [employer] of Steps: 3 Entrance Stairs-Rails: Can reach both;Left;Right Home Layout: One level Bathroom Shower/Tub: Tub/shower unit  Lives With: Spouse Prior Function Level of Independence: Independent with transfers;Independent with gait  Able to Take Stairs?: Yes Driving: Yes Vocation: Retired Biomedical scientist: Designer, fashion/clothing at [employer] Comments: Chronic L foot drop, occasionally uses SPC for gait >30ft Vision/Perception  Perception Perception: Within Functional Limits Praxis Praxis: Intact  Cognition Overall Cognitive Status: Within Functional Limits for tasks assessed Arousal/Alertness: Awake/alert Orientation Level: Oriented X4 Memory: Appears intact Immediate Memory Recall: Sock;Blue;Bed Memory Recall Sock: Without Cue Memory Recall Blue: Without Cue Memory Recall Bed: Without Cue Awareness: Appears intact Problem Solving: Impaired Problem Solving Impairment: Functional complex Behaviors: Other (comment) (Mild impulsivity) Safety/Judgment: Impaired Sensation Sensation Light Touch: Impaired by gross assessment Hot/Cold: Not tested Proprioception: Appears Intact Stereognosis: Impaired Detail Stereognosis Impaired Details: Impaired LUE Additional Comments: unable to detect door key Coordination Gross Motor Movements are Fluid and Coordinated: No Fine Motor Movements are Fluid and Coordinated: No Coordination and Movement Description: Blocked movement patterns, very effortful Heel Shin Test: UTA LLE due to tone and weakness Motor  Motor Motor: Abnormal  tone;Hemiplegia;Ataxia Motor - Skilled Clinical Observations: Significant LLE tone in adductors and knee flexors. Gait ataxia with scissoring of LLE   Trunk/Postural Assessment  Cervical Assessment Cervical Assessment: Exceptions to WFL (UTA, hard collar on) Thoracic Assessment Thoracic Assessment: Within Functional Limits Lumbar Assessment Lumbar Assessment: Within Functional Limits Postural Control Postural Control: Within Functional Limits  Balance Balance Balance Assessed: Yes Static Sitting Balance Static Sitting - Balance Support: Feet supported;No upper extremity supported Static Sitting - Level  of Assistance: 5: Stand by assistance Dynamic Sitting Balance Dynamic Sitting - Balance Support: During functional activity;No upper extremity supported Dynamic Sitting - Level of Assistance: 4: Min Insurance risk surveyor Standing - Balance Support: Bilateral upper extremity supported Static Standing - Level of Assistance: 4: Min assist Dynamic Standing Balance Dynamic Standing - Balance Support: During functional activity Dynamic Standing - Level of Assistance: 3: Mod assist Extremity Assessment  RUE Assessment Passive Range of Motion (PROM) Comments: WFL Active Range of Motion (AROM) Comments: WFL General Strength Comments: 4+/5, mild ataxia with finger to nose test LUE Assessment Passive Range of Motion (PROM) Comments: WFL Active Range of Motion (AROM) Comments: WFL except for difficulty reaching all the way to R shoulder General Strength Comments: 4-/5, slight tone in finger flexors RLE Assessment RLE Assessment: Within Functional Limits LLE Assessment LLE Assessment: Exceptions to [facility] General Strength Comments: Ankle PF 3/5, ankle DF 2-/5, knee ext 4-/5, knee flex 2/5 (limited by tone), hip flex 2-/5  Care Tool Care Tool Bed Mobility Roll left and right activity   Roll left and right assist level: Moderate Assistance - Patient 50 - 74%    Sit to lying  activity   Sit to lying assist level: Moderate Assistance - Patient 50 - 74%    Lying to sitting edge of bed activity   Lying to sitting edge of bed assist level: Moderate Assistance - Patient 50 - 74%     Care Tool Transfers Sit to stand transfer   Sit to stand assist level: Moderate Assistance - Patient 50 - 74%    Chair/bed transfer   Chair/bed transfer assist level: Moderate Assistance - Patient 50 - 74%     Physiological scientist transfer assist level: Maximal Assistance - Patient 25 - 49%      Care Tool Locomotion Ambulation   Assist level: 2 helpers (w/c follow for safety) Assistive device: Other (comment) (R hand rail) Max distance: 36f  Walk 10 feet activity   Assist level: 2 helpers Assistive device: Other (comment) (R hand rail)   Walk 50 feet with 2 turns activity Walk 50 feet with 2 turns activity did not occur: Safety/medical concerns      Walk 150 feet activity Walk 150 feet activity did not occur: Safety/medical concerns      Walk 10 feet on uneven surfaces activity Walk 10 feet on uneven surfaces activity did not occur: Safety/medical concerns      Stairs Stair activity did not occur: Safety/medical concerns        Walk up/down 1 step activity Walk up/down 1 step or curb (drop down) activity did not occur: Safety/medical concerns     Walk up/down 4 steps activity did not occuR: Safety/medical concerns  Walk up/down 4 steps activity      Walk up/down 12 steps activity Walk up/down 12 steps activity did not occur: Safety/medical concerns      Pick up small objects from floor Pick up small object from the floor (from standing position) activity did not occur: Safety/medical concerns      Wheelchair Will patient use wheelchair at discharge?: No          Wheel 50 feet with 2 turns activity      Wheel 150 feet activity        Refer to Care Plan for Long Term Goals  SHORT TERM GOAL WEEK 1 PT Short Term Goal 1 (Week 1):  Pt will compete bed mobility with minA and no bed features PT Short Term Goal 2 (Week 1): Pt will complete bed<>chair transfers with minA and LRAD PT Short Term Goal 3 (Week 1): Pt will ambulate 58f with modA and LRAD  Recommendations for other services: Neuropsych  Skilled Therapeutic Intervention Mobility Bed Mobility Bed Mobility: Not assessed (Pt sitting in recliner on arrival and at end of sessoin) Transfers Transfers: Sit to Stand;Stand to Sit;Stand Pivot Transfers Sit to Stand: Moderate Assistance - Patient 50-74% Stand to Sit: Moderate Assistance - Patient 50-74% Stand Pivot Transfers: Moderate Assistance - Patient 50 - 74% Stand Pivot Transfer Details: Tactile cues for initiation;Tactile cues for sequencing;Tactile cues for weight shifting;Tactile cues for placement;Tactile cues for weight beaing;Visual cues for safe use of DME/AE;Visual cues/gestures for precautions/safety;Verbal cues for sequencing;Visual cues/gestures for sequencing;Verbal cues for technique;Verbal cues for precautions/safety;Verbal cues for gait pattern;Verbal cues for safe use of DME/AE;Manual facilitation for weight shifting;Manual facilitation for placement Transfer (Assistive device): Rolling walker Locomotion  Gait Ambulation: Yes Gait Assistance: 2 Helpers Gait Distance (Feet): 35 Feet Assistive device: Other (Comment) (R hand rail) Gait Assistance Details: Tactile cues for initiation;Tactile cues for sequencing;Tactile cues for weight shifting;Tactile cues for posture;Tactile cues for placement;Tactile cues for weight beaing;Visual cues for safe use of DME/AE;Visual cues/gestures for precautions/safety;Visual cues/gestures for sequencing;Verbal cues for sequencing;Verbal cues for technique;Verbal cues for precautions/safety;Verbal cues for gait pattern;Verbal cues for safe use of DME/AE;Manual facilitation for weight shifting;Manual facilitation for placement Gait Assistance Details: Therapist on stool  assisting with LLE facilitation and foot placement. Assist needed for breaking tone and facilitating knee flexion and foot placement. Gait Gait: Yes Gait Pattern: Impaired Gait Pattern: Step-to pattern;Decreased step length - right;Decreased step length - left;Decreased stance time - right;Decreased stride length;Decreased hip/knee flexion - left;Decreased dorsiflexion - left;Decreased weight shift to left;Left foot flat;Left flexed knee in stance;Scissoring;Ataxic;Lateral hip instability;Narrow base of support;Poor foot clearance - left Gait velocity: slow Stairs / Additional Locomotion Stairs: No Wheelchair Mobility Wheelchair Mobility: No  Skilled Intervention:  Pt received sitting upright in recliner, awake and agreeable to therapy. Initiated functional mobility as outlined above. Pt is pleasant and motivated to return to mod I level with cane, oriented x4. Hard collar donned on arrival and remained on throughout session. Pt educated on spinal precautions and aware of collar restrictions. Pt with impaired L upper extremity and L lower extremity sensation, able to detect touch but diminished sensation. Impaired stereognosis on in L hand, unable to detect a door key. L ankle proprioception intact. L ankle PROM intact to neutral but L ankle strength 2-/5. Pt with baseline L foot drop but reports strength deficits are new. Spoke with MD regarding PRAFO needs to preserve ankle ROM. Significant tone in L lower extremity as well, may need to consider pharmacological management to assist?  Sit<>stand with modA from recliner height to RW, cues for forward weight shift due to posterior bias. Requires minA for standing balance with RW support. Ambulated ~169fwith modA and RW to w/c. Gait deficits include ataxia with LLE adduction causing scissoring gait pattern, decreased L foot clearance with toe drag, narrow BOS, decreased gait speed.  W/c transport to ortho gym. Performed car transfer with modA and no  AD via face-to-face technique, required significant assist for LLE management due to tone. Pt reports several options for vehicles (sedans, mid-size, etc).   Gait training in hallway with R hand rail and +2 assist for w/c follow for safety. Ambulated 3586fith modA with therapist on  stool assisting with L foot placement, facilitating LLE swing due to his significant tone, and promoting L abduction to reduce scissoring.   W/c transport back to his room, stand<>pivot with modA and no AD from w/c back to his recliner. He remained seated in recliner with BLE elevated and safety belt alarm on, needs within reach.  Instructed pt in results of PT evaluation as detailed above, PT POC, rehab potential, rehab goals, and discharge recommendations. Additionally discussed CIR's policies regarding fall safety and use of chair alarm and/or quick release belt. Pt verbalized understanding and in agreement. Will update pt's family members as they become available.   Discharge Criteria: Patient will be discharged from PT if patient refuses treatment 3 consecutive times without medical reason, if treatment goals not met, if there is a change in medical status, if patient makes no progress towards goals or if patient is discharged from hospital.  The above assessment, treatment plan, treatment alternatives and goals were discussed and mutually agreed upon: by patient  Alger Simons PT, DPT 08/15/2020, 12:28 PM

## 2020-08-15 NOTE — Evaluation (Signed)
Occupational Therapy Assessment and Plan  Patient Details  Name: Keith Downs MRN: [id number] Date of Birth: Dec 30, 1960  OT Diagnosis: ataxia, muscle weakness (generalized) and spasticity Rehab Potential: Rehab Potential (ACUTE ONLY): Good ELOS: 20-21 days   Today's Date: 08/15/2020 OT Individual Time: 0900-1000 OT Individual Time Calculation (min): 60 min     Hospital Problem: Principal Problem:   Cervical myelopathy ([place])   Past Medical History:  Past Medical History:  Diagnosis Date  . Ambulates with cane   . Anemia   . Arthritis    back, knee  . Asthma   . BPH (benign prostatic hyperplasia)   . ED (erectile dysfunction)   . Foot drop, left    some in right foot  . GERD (gastroesophageal reflux disease)   . Headache   . History of hiatal hernia    patient denied this dx  . HLD (hyperlipidemia)   . Hypertension   . Hypothyroidism   . Neuromuscular disorder ([facility])    numbness in right hand  . Peripheral vascular disease ([place])   . Seasonal allergies   . Valvular heart disease    followed by [facility] cardiology   Past Surgical History:  Past Surgical History:  Procedure Laterality Date  . ANTERIOR CERVICAL CORPECTOMY N/A 08/09/2020   Procedure: Corpectomy Cervical five with anterior plating Cervical three-Cervical six;  Surgeon: Eustace Moore, MD;  Location: [place];  Service: Neurosurgery;  Laterality: N/A;  . ANTERIOR CERVICAL DECOMP/DISCECTOMY FUSION N/A 08/09/2020   Procedure: Anterior Cervical Decompression Fusion  - Cervical three-Cervical four;  Surgeon: Eustace Moore, MD;  Location: [place];  Service: Neurosurgery;  Laterality: N/A;  . BACK SURGERY     L5-S1  . CARDIAC CATHETERIZATION  2019  . COLONOSCOPY     multiple  . cyst removal     from head  . HAND SURGERY  1982  . HEMORRHOID SURGERY    . HERNIA REPAIR Bilateral   . PROSTATE BIOPSY    . UPPER GI ENDOSCOPY     multiple  . VEIN SURGERY  2019    Assessment & Plan Clinical Impression:    Keith Downs is a 60 year old male with history of HTN, BPH, neck pain, valvular disease, left foot drop,  progressive numbness and weakness with problems ambulating due to HNP C3/4 and C4/5 with C5/6 myelopathy.  History taken from chart review and patient.  He failed conservative management and was admitted on 08/09/2021 for ACDF of C3-C6 with arthrodesis of C3-C4 and C4-C6 by Dr. Ronnald Ramp. Post surgery in PACU he was found to have LUE>RUE weakness, flaccid LLE and RLE weakness with increased tone. MRI C spine was negative for hematoma and showed interval decompression with improvement in spinal stenosis. Weakness felt to be due to cord reexpansion after severe compression and he was started on Decadron with slow improvement.  Baclofen was added for spasticity and foley in place due to neurogenic bladder. Therapy has been ongoing and he continues to be limited by ataxia with bilateral lower extremity weakness, DOE and LUE weakness affecting ADLs. CIR recommended due to functional decline.  Please see preadmission assessment from earlier today as well.     Patient transferred to CIR on 08/14/2020 .    Patient currently requires mod with basic self-care skills secondary to muscle weakness and muscle joint tightness, abnormal tone, unbalanced muscle activation, ataxia and decreased coordination and decreased sitting balance, decreased standing balance and decreased balance strategies.  Prior to hospitalization, patient could  complete ADLs independently.  Patient will benefit from skilled intervention to increase independence with basic self-care skills prior to discharge home with care partner.  Anticipate patient will require intermittent supervision and follow up outpatient.  OT - End of Session Endurance Deficit: Yes Endurance Deficit Description: Benefits from brief seated rest for recovery OT Assessment Rehab Potential (ACUTE ONLY): Good OT Patient demonstrates impairments in the following area(s):  Balance;Endurance;Motor;Safety;Sensory OT Basic ADL's Functional Problem(s): Bathing;Dressing;Toileting OT Transfers Functional Problem(s): Toilet;Tub/Shower OT Additional Impairment(s): None OT Plan OT Intensity: Minimum of 1-2 x/day, 45 to 90 minutes OT Frequency: 5 out of 7 days OT Duration/Estimated Length of Stay: 20-21 days OT Treatment/Interventions: Balance/vestibular training;Discharge planning;Functional mobility training;DME/adaptive equipment instruction;Neuromuscular re-education;Pain management;Patient/family education;Psychosocial support;Therapeutic Activities;Self Care/advanced ADL retraining;Therapeutic Exercise;UE/LE Strength taining/ROM;UE/LE Coordination activities;Wheelchair propulsion/positioning OT Self Feeding Anticipated Outcome(s): no goal, pt is independent OT Basic Self-Care Anticipated Outcome(s): supervision OT Toileting Anticipated Outcome(s): supervision OT Bathroom Transfers Anticipated Outcome(s): supervision OT Recommendation Patient destination: Home Follow Up Recommendations: Outpatient OT Equipment Recommended: Tub/shower seat   OT Evaluation Precautions/Restrictions  Precautions Precautions: Cervical;Fall Required Braces or Orthoses: Cervical Brace Restrictions Weight Bearing Restrictions: No Other Position/Activity Restrictions: Hard collar when OOB, may apply sitting EOB      Pain Pain Assessment Pain Score: 0-No pain Home Living/Prior Functioning Home Living Family/patient expects to be discharged to:: Private residence Living Arrangements: Spouse/significant other Available Help at Discharge: Family,Available 24 hours/day (Wife can provide 24/7. Several family members live nearby who can assist as well) Type of Home: House Home Access: Stairs to enter [employer] of Steps: 3 Entrance Stairs-Rails: Can reach both,Left,Right Home Layout: One level Bathroom Shower/Tub: Tub/shower unit  Lives With: Spouse Prior  Function Level of Independence: Independent with transfers,Independent with gait  Able to Take Stairs?: Yes Driving: Yes Vocation: Retired Biomedical scientist: Designer, fashion/clothing at [employer]: Chronic L foot drop, occasionally uses SPC for gait >4ft Vision Baseline Vision/History: Wears glasses;No visual deficits Wears Glasses: Reading only Patient Visual Report: No change from baseline Vision Assessment?: No apparent visual deficits Perception  Perception: Within Functional Limits Praxis Praxis: Intact Cognition Overall Cognitive Status: Within Functional Limits for tasks assessed Arousal/Alertness: Awake/alert Orientation Level: Person;Place;Situation Person: Oriented Place: Oriented Situation: Oriented Year: 2022 Month: January Day of Week: Correct Memory: Appears intact Immediate Memory Recall: Sock;Blue;Bed Memory Recall Sock: Without Cue Memory Recall Blue: Without Cue Memory Recall Bed: Without Cue Awareness: Appears intact Problem Solving: Impaired Problem Solving Impairment: Functional complex Behaviors: Other (comment) (Mild impulsivity) Safety/Judgment: Impaired Sensation Sensation Light Touch: Impaired by gross assessment Hot/Cold: Not tested Proprioception: Appears Intact Stereognosis: Impaired Detail Stereognosis Impaired Details: Impaired LUE Additional Comments: unable to detect door key Coordination Gross Motor Movements are Fluid and Coordinated: No Fine Motor Movements are Fluid and Coordinated: No Coordination and Movement Description: Blocked movement patterns, very effortful Heel Shin Test: UTA LLE due to tone and weakness Motor  Motor Motor: Abnormal tone;Hemiplegia;Ataxia Motor - Skilled Clinical Observations: Significant LLE tone in adductors and knee flexors. Gait ataxia with scissoring of LLE  Trunk/Postural Assessment  Cervical Assessment Cervical Assessment: Exceptions to WFL (UTA, hard collar on) Thoracic  Assessment Thoracic Assessment: Within Functional Limits Lumbar Assessment Lumbar Assessment: Within Functional Limits Postural Control Postural Control: Within Functional Limits  Balance Balance Balance Assessed: Yes Static Sitting Balance Static Sitting - Balance Support: Feet supported;No upper extremity supported Static Sitting - Level of Assistance: 5: Stand by assistance Dynamic Sitting Balance Dynamic Sitting - Balance Support: During functional activity;No upper extremity supported Dynamic  Sitting - Level of Assistance: 4: Min Insurance risk surveyor Standing - Balance Support: Bilateral upper extremity supported Static Standing - Level of Assistance: 4: Min assist Dynamic Standing Balance Dynamic Standing - Balance Support: During functional activity Dynamic Standing - Level of Assistance: 3: Mod assist Extremity/Trunk Assessment RUE Assessment Passive Range of Motion (PROM) Comments: WFL Active Range of Motion (AROM) Comments: WFL General Strength Comments: 4+/5, mild ataxia with finger to nose test LUE Assessment Passive Range of Motion (PROM) Comments: WFL Active Range of Motion (AROM) Comments: WFL except for difficulty reaching all the way to R shoulder General Strength Comments: 4-/5, slight tone in finger flexors  Care Tool Care Tool Self Care Eating   Eating Assist Level: Independent    Oral Care    Oral Care Assist Level: Set up assist    Bathing   Body parts bathed by patient: Left arm;Chest;Abdomen;Front perineal area;Buttocks;Right upper leg;Left upper leg;Face Body parts bathed by helper: Right arm;Right lower leg;Left lower leg   Assist Level: Moderate Assistance - Patient 50 - 74%    Upper Body Dressing(including orthotics)   What is the patient wearing?: Pull over shirt   Assist Level: Minimal Assistance - Patient > 75%    Lower Body Dressing (excluding footwear)   What is the patient wearing?: Underwear/pull up;Pants Assist  for lower body dressing: Maximal Assistance - Patient 25 - 49%    Putting on/Taking off footwear   What is the patient wearing?: Shoes;Ted hose Assist for footwear: Dependent - Patient 0%       Care Tool Toileting Toileting activity   Assist for toileting: Moderate Assistance - Patient 50 - 74%     Care Tool Bed Mobility Roll left and right activity   Roll left and right assist level: Moderate Assistance - Patient 50 - 74%    Sit to lying activity   Sit to lying assist level: Moderate Assistance - Patient 50 - 74%    Lying to sitting edge of bed activity   Lying to sitting edge of bed assist level: Moderate Assistance - Patient 50 - 74%     Care Tool Transfers Sit to stand transfer   Sit to stand assist level: Moderate Assistance - Patient 50 - 74%    Chair/bed transfer   Chair/bed transfer assist level: Moderate Assistance - Patient 50 - 74%     Toilet transfer   Assist Level: Moderate Assistance - Patient 50 - 74% (with use of stedy)     Care Tool Cognition Expression of Ideas and Wants  WFL   Understanding Verbal and Non-Verbal Content Understanding Verbal and Non-Verbal Content: Understands (complex and basic) - clear comprehension without cues or repetitions   Memory/Recall Ability *first 3 days only Memory/Recall Ability *first 3 days only: Current season;Location of own room;That he or she is in a hospital/hospital unit;Staff names and faces    Refer to Care Plan for Long Term Goals  SHORT TERM GOAL WEEK 1 OT Short Term Goal 1 (Week 1): Pt will demonstrate improved balance to be able to stand with RW and release 1 hand to pull pants up with min A. OT Short Term Goal 2 (Week 1): Pt will be able to complete squat pivot to toilet with mod A of 1. OT Short Term Goal 3 (Week 1): Pt will be able to don shirt over head with set up. OT Short Term Goal 4 (Week 1): Pt will be able to don pants over feet with min A  with AE as needed.  Recommendations for other services:  None    Skilled Therapeutic Intervention ADL ADL Eating: Independent Grooming: Setup Upper Body Bathing: Minimal assistance Lower Body Bathing: Moderate assistance Upper Body Dressing: Minimal assistance Lower Body Dressing: Maximal assistance Toileting: Moderate assistance Toilet Transfer: Moderate assistance Toilet Transfer Method: Other (comment) (stedy) Mobility  Bed Mobility Bed Mobility: Not assessed (Pt sitting in recliner on arrival and at end of sessoin) Transfers Sit to Stand: Moderate Assistance - Patient 50-74% Stand to Sit: Moderate Assistance - Patient 50-74%   Pt seen for initial evaluation and ADL training with a focus on completing self care with use of Stedy for support.  Discussed OT POC, pt's goals and ELOS. Pt is very motivated and eager to stay as long as possible to get as independent as possible.  Pt is not supposed to shower but he was eager to get rinsed off in shower vs a sponge bathe. Pt stated he would be careful to to keep shower head lower than his neck. Pt bathed at mod A sitting on bench in shower.  Used stedy to move from recliner to bench back to recliner to dress. Pt stands very well in stedy, pulling up with min A.  Tried 1 stand with RW, pt able to rise to stand with mod A and hold static balance with mod A.  Pt unable to safely march in place without max A.  Did not have pt try to release hand with RW to pull pants up but he can pull up with max in stedy.  Pt resting in recliner with all needs met.  Seat alarm on.   Discharge Criteria: Patient will be discharged from OT if patient refuses treatment 3 consecutive times without medical reason, if treatment goals not met, if there is a change in medical status, if patient makes no progress towards goals or if patient is discharged from hospital.  The above assessment, treatment plan, treatment alternatives and goals were discussed and mutually agreed upon: by patient  [facility] 08/15/2020, 12:47  PM

## 2020-08-15 NOTE — Progress Notes (Signed)
Patient sitting up in bed with his collar on.  He is having no pain.  He is quite spastic.  He has some trouble with finger extension.  His right grip is better than his left grip.  His left lower extremity is spastic and 3 out of 5.  Dorsiflexion and plantar flexion seems stable.  Overall he appears stable although I think he is becoming more spastic.  I would titrate his baclofen up as tolerated.  He may need Botox.  Continue activity per rehab.

## 2020-08-15 NOTE — Progress Notes (Signed)
Keith Arn, MD  Physician  Physical Medicine and Rehabilitation  PMR Pre-admission      Signed  Date of Service:  08/11/2020 11:27 AM      Related encounter: Admission (Discharged) from 08/09/2020 in [place]          Show:Clear all [x] Manual[x] Template[x] Copied  Added by: [x] Cristina Gong, RN[x] Patel, Domenick Bookbinder, MD[x] Genella Mech, CCC-SLP   [] Hover for details  PMR Admission Coordinator Pre-Admission Assessment   Patient: Keith Downs is an 61 y.o., male MRN: [id number] DOB: 11-19-1960 Height: 5\' 11"  (180.3 cm) Weight: 88 kg   Insurance Information HMO:     PPO: yes     PCP:      IPA:      80/20:      OTHER:  PRIMARY: [employer] P[employer]      Policy#: [id number]      Subscriber: Pt                                     CM Name: Caryl Pina      Phone#: [phone]     Fax#: [phone] Pre-Cert#: [id number] approved for 7 days      Employer: [employer] Date: 05/27/2021 - still active Deductible: $150 ($0 met) OOP Max: $1,000 ($20 met) CIR: 90% coverage, 10% co-insurance SNF: 90% coverage, 10% co-insurance Outpatient:$20 copay per visit; limited to 60 combined rehabilitative visits Home Health: 90% coverage, 10% co-insurance DME: 90% coverage, 10% co-insurance Providers: in network SECONDARY: none     Policy#:      Phone#:  Development worker, community:      Phone#:    The Engineer, petroleum" for patients in Inpatient Rehabilitation Facilities with attached "Privacy Act [place] Records" was provided and verbally reviewed with: Patient   Emergency Contact Information         Contact Information     Name Relation Home Work [place] Spouse     [phone]         Current Medical History  Patient Admitting Diagnosis: Cervical Myelopathy, s/p anterior cervical fusion   History of Present Illness: Pt is a 4  male with PMH  significant for hypertension, previous back surgery that resulted in L foot drop who was admitted with arm pain, numbness,  and gait distubance. Pt underwent decompressive anterior cervical discectomy C3-4 , ecompressive anterior cervical corpectomy,  anterior cervical plating C3-6 by neurosurgery on  08/10/19. Following surgery, Pt. Reports he is weaker that prior to surgery with minimal movement in his L lower extremity. Referral to CIR was made to assist Pt. In returning to PLOF.    Patient's medical record from [facility]  has been reviewed by the rehabilitation admission coordinator and physician.   Past Medical History      Past Medical History:  Diagnosis Date  . Ambulates with cane    . Anemia    . Arthritis      back, knee  . Asthma    . BPH (benign prostatic hyperplasia)    . ED (erectile dysfunction)    . Foot drop, left      some in right foot  . GERD (gastroesophageal reflux disease)    . Headache    . History of hiatal hernia      patient  denied this dx  . HLD (hyperlipidemia)    . Hypertension    . Hypothyroidism    . Neuromuscular disorder (HCC)      numbness in right hand  . Peripheral vascular disease ([place])    . Seasonal allergies        Family History   family history is not on file.   Prior Rehab/Hospitalizations Has the patient had prior rehab or hospitalizations prior to admission? Yes   Has the patient had major surgery during 100 days prior to admission? Yes              Current Medications   Current Facility-Administered Medications:  .  0.9 % NaCl with KCl 20 mEq/ L  infusion, , Intravenous, Continuous, Eustace Moore, MD, Last Rate: 100 mL/hr at 08/11/20 0135, New Bag at 08/11/20 0135 .  acetaminophen (TYLENOL) tablet 650 mg, 650 mg, Oral, Q4H PRN, 650 mg at 08/10/20 1943 **OR** acetaminophen (TYLENOL) suppository 650 mg, 650 mg, Rectal, Q4H PRN, Eustace Moore, MD .  albuterol (VENTOLIN HFA) 108 (90 Base) MCG/ACT inhaler 2 puff, 2 puff,  Inhalation, Q6H PRN, Eustace Moore, MD .  baclofen (LIORESAL) tablet 5 mg, 5 mg, Oral, TID, Eustace Moore, MD, 5 mg at 08/11/20 1000 .  Chlorhexidine Gluconate Cloth 2 % PADS 6 each, 6 each, Topical, Daily, Eustace Moore, MD, 6 each at 08/11/20 [phone] .  dexamethasone (DECADRON) injection 4 mg, 4 mg, Intravenous, Q6H **OR** dexamethasone (DECADRON) tablet 6 mg, 6 mg, Oral, Q6H, Eustace Moore, MD, 6 mg at 08/11/20 0532 .  gabapentin (NEURONTIN) capsule 300 mg, 300 mg, Oral, TID, Eustace Moore, MD, 300 mg at 08/11/20 1000 .  irbesartan (AVAPRO) tablet 150 mg, 150 mg, Oral, Daily, 150 mg at 08/11/20 0953 **AND** hydrochlorothiazide (MICROZIDE) capsule 12.5 mg, 12.5 mg, Oral, Daily, Eustace Moore, MD, 12.5 mg at 08/11/20 1000 .  levothyroxine (SYNTHROID) tablet 125 mcg, 125 mcg, Oral, Q0600, Eustace Moore, MD, 125 mcg at 08/11/20 0531 .  menthol-cetylpyridinium (CEPACOL) lozenge 3 mg, 1 lozenge, Oral, PRN **OR** phenol (CHLORASEPTIC) mouth spray 1 spray, 1 spray, Mouth/Throat, PRN, Eustace Moore, MD .  mometasone-formoterol [facility]) 100-5 MCG/ACT inhaler 2 puff, 2 puff, Inhalation, BID, Eustace Moore, MD, 2 puff at 08/11/20 [phone] .  morphine 2 MG/ML injection 2 mg, 2 mg, Intravenous, Q2H PRN, Eustace Moore, MD .  multivitamin with minerals tablet 1 tablet, 1 tablet, Oral, Daily, Eustace Moore, MD, 1 tablet at 08/11/20 1000 .  ondansetron (ZOFRAN) tablet 4 mg, 4 mg, Oral, Q6H PRN **OR** ondansetron (ZOFRAN) injection 4 mg, 4 mg, Intravenous, Q6H PRN, Eustace Moore, MD .  oxyCODONE (Oxy IR/ROXICODONE) immediate release tablet 5 mg, 5 mg, Oral, Q3H PRN, Eustace Moore, MD, 5 mg at 08/11/20 1000 .  senna (SENOKOT) tablet 8.6 mg, 1 tablet, Oral, BID, Eustace Moore, MD, 8.6 mg at 08/11/20 1000 .  sodium chloride flush (NS) 0.9 % injection 3 mL, 3 mL, Intravenous, Q12H, Eustace Moore, MD, 3 mL at 08/11/20 1001 .  sodium chloride flush (NS) 0.9 % injection 3 mL, 3 mL, Intravenous, PRN, Eustace Moore,  MD   Patients Current Diet:     Diet Order                      Diet regular Room service appropriate? Yes; Fluid consistency: Thin  Diet effective now  Precautions / Restrictions Precautions Precautions: Cervical,Fall Precaution Booklet Issued: Yes (comment) Precaution Comments: discussion of neck/cervical precautions; able to state with visual cues 3/3. Cervical Brace: Hard collar,At all times Restrictions Weight Bearing Restrictions: No    Has the patient had 2 or more falls or a fall with injury in the past year? Yes   Prior Activity Level Community (5-7x/wk): Pt. was avtive in the community, working PTA   Prior Functional Level Self Care: Did the patient need help bathing, dressing, using the toilet or eating? Independent   Indoor Mobility: Did the patient need assistance with walking from room to room (with or without device)? Independent   Stairs: Did the patient need assistance with internal or external stairs (with or without device)? Needed some help   Functional Cognition: Did the patient need help planning regular tasks such as shopping or remembering to take medications? Independent   Home Assistive Devices / [place] Devices/Equipment: Cane (specify quad or straight),C-collar Home Equipment: Walker - 2 wheels,Cane - single point   Prior Device Use: Indicate devices/aids used by the patient prior to current illness, exacerbation or injury? None of the above   Current Functional Level Cognition   Overall Cognitive Status: Within Functional Limits for tasks assessed Orientation Level: Oriented X4 General Comments: Pt following all commands; no impulsivity noted today    Extremity Assessment (includes Sensation/Coordination)   Upper Extremity Assessment: Generalized weakness RUE Deficits / Details: 4-5/ strength shoulder through elbow; wrist and hand 3+/5; AROM, WFLs RUE Sensation: decreased light touch RUE  Coordination: decreased fine motor,decreased gross motor LUE Deficits / Details: strength 3+/5 MM grade at shoulder through elbow; wrist and hand 2+/5 MM grade, fair grip strength LUE Sensation: decreased light touch LUE Coordination: decreased fine motor,decreased gross motor  Lower Extremity Assessment: Defer to PT evaluation,LLE deficits/detail RLE Deficits / Details: Coordination and sensation deficits RLE:  (Grossly 3+/5 . Tends to move in synergy patterns, but is able to isolate when focused) RLE Sensation: decreased proprioception RLE Coordination: decreased gross motor LLE Deficits / Details: LLE hyperextending today in standing LLE:  (Moves in synergistic pattern and has more difficulty isolating muscles than he does on the RLE) LLE Sensation: decreased proprioception LLE Coordination: decreased gross motor     ADLs   Overall ADL's : Needs assistance/impaired Eating/Feeding: Set up,Sitting Eating/Feeding Details (indicate cue type and reason): pt able to hold cup in L hand to bring to mouth with straw Grooming: Min guard,Sitting Upper Body Bathing: Minimal assistance,Sitting Lower Body Bathing: Maximal assistance,Sitting/lateral leans,Sit to/from stand Upper Body Dressing : Moderate assistance,Sitting Upper Body Dressing Details (indicate cue type and reason): requiring assist for applying C-collar Lower Body Dressing: Maximal assistance,Cueing for safety,Sitting/lateral leans,Sit to/from stand,+2 for physical assistance Lower Body Dressing Details (indicate cue type and reason): totalA for donning socks; pt could pull underwear from knees to waist Toilet Transfer: Maximal assistance,+2 for physical assistance,+2 for safety/equipment,Stand-pivot Toilet Transfer Details (indicate cue type and reason): +2 assist simulation to recliner towards R side; PT blocking R knee and 1 OT blocking L knee, but pt hyperextending L knee with wt bear and movement Toileting- Clothing Manipulation  and Hygiene: Maximal assistance,+2 for physical assistance,+2 for safety/equipment,Sitting/lateral lean,Sit to/from stand,Cueing for safety Toileting - Clothing Manipulation Details (indicate cue type and reason): requiring assist Functional mobility during ADLs: +2 for physical assistance,+2 for safety/equipment,Maximal assistance,Cueing for safety,Cueing for sequencing General ADL Comments: Pt limited by decreased strength and coordination in all 4 extremities, poor mobility and decreased sensation with  poor ability to control transfers and bed mobility. Pt set-upA to maxA for ADL at this time.     Mobility   Overal bed mobility: Needs Assistance Bed Mobility: Rolling,Sidelying to Sit Rolling: Min assist Sidelying to sit: Mod assist General bed mobility comments: Assist to reach LUE to rail and sequencing cues required for log roll, but much smoother today. Pt able to perform rolling with increased spasticity in BLEs-     Transfers   Overall transfer level: Needs assistance Equipment used: 2 person hand held assist Transfers: Sit to/from [employer] Sit to Stand: Max assist,+2 physical assistance,+2 safety/equipment,From elevated surface Stand pivot transfers: Max assist,+2 physical assistance,+2 safety/equipment,From elevated surface,Mod assist General transfer comment: R knee blocked; L knee hyperextending and slidign on floor; pain increased w     Ambulation / Gait / Stairs / Wheelchair Mobility   Ambulation/Gait Ambulation/Gait assistance:  (Unable today due to lower extremity weakness) General Gait Details: Deferred due to safety     Posture / Balance Dynamic Sitting Balance Sitting balance - Comments: supervisionA to minguardA for sitting EOB or unsupported in recliner Balance Overall balance assessment: Needs assistance Sitting-balance support: Feet supported,Bilateral upper extremity supported Sitting balance-Leahy Scale: Poor Sitting balance - Comments:  supervisionA to minguardA for sitting EOB or unsupported in recliner Postural control: Posterior lean Standing balance support: During functional activity Standing balance-Leahy Scale: Zero Standing balance comment: requires modA to maxA for stand pivot     Special needs/care consideration Special service needs Cervical Brace, Cervical Collar    Previous Home Environment  Living Arrangements: Spouse/significant other Available Help at Discharge: Family,Available 24 hours/day Type of Home: House Home Layout: One level Home Access: Stairs to enter Entrance Stairs-Rails: Can reach both Entrance Stairs-Number of Steps: 3 Bathroom Shower/Tub: Multimedia programmer: Handicapped height Bathroom Accessibility: Yes How Accessible: Accessible via walker [place]: No Additional Comments: "unsteady grab bar in shower"   Discharge Living Setting Plans for Discharge Living Setting: Patient's home Type of Home at Discharge: House Discharge Home Layout: One level Discharge Home Access: Stairs to enter Entrance Stairs-Rails: Can reach both Entrance Stairs-Number of Steps: 3 Discharge Bathroom Shower/Tub: Walk-in shower Discharge Bathroom Toilet: Handicapped height Discharge Bathroom Accessibility: Yes How Accessible: Accessible via walker Does the patient have any problems obtaining your medications?: No   Social/Family/Support Systems Patient Roles: Spouse Contact Information: [phone] Anticipated Caregiver: Tonatiuh Mallon Anticipated Caregiver's Contact Information: [phone] Ability/Limitations of Caregiver: Can provide Mod A Caregiver Availability: 24/7 Discharge Plan Discussed with Primary Caregiver: Yes Is Caregiver In Agreement with Plan?: Yes Does Caregiver/Family have Issues with Lodging/Transportation while Pt is in Rehab?: No   Patient is from [place], [place] so wife will arrive prior to patient discharge or stay in hotel    Goals Patient/Family  Goal for Rehab: PT/OT MIn A Expected length of stay: 14-18 days Pt/Family Agrees to Admission and willing to participate: Yes Program Orientation Provided & Reviewed with Pt/Caregiver Including Roles  & Responsibilities: Yes   Decrease burden of Care through IP rehab admission: n/a   Possible need for SNF placement upon discharge: not anticipated   Patient Condition: I have reviewed medical records from [facility], spoken with CM, and patient. I met with patient at the bedside and discussed via phone for inpatient rehabilitation assessment.  Patient will benefit from ongoing PT and OT, can actively participate in 3 hours of therapy a day 5 days of the week, and can make measurable gains during the admission.  Patient will also benefit from the coordinated team approach during an Inpatient Acute Rehabilitation admission.  The patient will receive intensive therapy as well as Rehabilitation physician, nursing, social worker, and care management interventions.  Due to safety, skin/wound care, disease management, medication administration, pain management and patient education the patient requires 24 hour a day rehabilitation nursing.  The patient is currently mod A to max+2 mod A** with mobility and basic ADLs.  Discharge setting and therapy post discharge at home with home health is anticipated.  Patient has agreed to participate in the Acute Inpatient Rehabilitation Program and will admit today.   Preadmission Screen Completed By:  Clemens Catholic with updates by Danne Baxter RN MSN, 08/14/2020 1056 ______________________________________________________________________   Discussed status with Dr. Posey Pronto  on  08/14/2020 at  1056 and received approval for admission today.   Admission Coordinator: Clemens Catholic with updates by  Danne Baxter RN MSN, time  1056 Date  08/14/2020    Assessment/Plan: Diagnosis: Cervical Myelopathy   1. Does the need for close, 24 hr/day Medical supervision  in concert with the patient's rehab needs make it unreasonable for this patient to be served in a less intensive setting? Yes 2. Co-Morbidities requiring supervision/potential complications: HTN (monitor and provide prns in accordance with increased physical exertion and pain), previous back surgery that resulted in L foot drop who was admitted with arm pain, numbness,  and gait distubance 3. Due to bladder management, bowel management, safety, skin/wound care, disease management, pain management and patient education, does the patient require 24 hr/day rehab nursing? Yes 4. Does the patient require coordinated care of a physician, rehab nurse, PT, OT to address physical and functional deficits in the context of the above medical diagnosis(es)? Yes Addressing deficits in the following areas: balance, endurance, locomotion, strength, transferring, bathing, dressing, toileting and psychosocial support 5. Can the patient actively participate in an intensive therapy program of at least 3 hrs of therapy 5 days a week? Yes 6. The potential for patient to make measurable gains while on inpatient rehab is excellent 7. Anticipated functional outcomes upon discharge from inpatient rehab: supervision and min assist PT, supervision and min assist OT, n/a SLP 8. Estimated rehab length of stay to reach the above functional goals is: 13-17 days. 9. Anticipated discharge destination: Home 10. Overall Rehab/Functional Prognosis: good     MD Signature: Delice Lesch, MD, ABPMR          Revision History                                            Note Details  Author Keith Arn, MD File Time 08/14/2020 11:03 AM  Author Type Physician Status Signed  Last Editor Keith Arn, MD Service Physical Medicine and [place] # [id number] Admit Date 08/14/2020

## 2020-08-16 ENCOUNTER — Inpatient Hospital Stay (HOSPITAL_COMMUNITY): Payer: BC Managed Care – PPO | Admitting: Occupational Therapy

## 2020-08-16 ENCOUNTER — Inpatient Hospital Stay (HOSPITAL_COMMUNITY): Payer: BC Managed Care – PPO

## 2020-08-16 MED ORDER — HYDROCHLOROTHIAZIDE 12.5 MG PO CAPS
12.5000 mg | ORAL_CAPSULE | Freq: Every day | ORAL | Status: DC
  Administered 2020-08-17 – 2020-08-19 (×3): 12.5 mg via ORAL
  Filled 2020-08-16 (×3): qty 1

## 2020-08-16 MED ORDER — TIZANIDINE HCL 4 MG PO TABS
4.0000 mg | ORAL_TABLET | Freq: Three times a day (TID) | ORAL | Status: DC | PRN
  Filled 2020-08-16 (×2): qty 1

## 2020-08-16 MED ORDER — TAMSULOSIN HCL 0.4 MG PO CAPS
0.4000 mg | ORAL_CAPSULE | Freq: Every day | ORAL | Status: DC
  Administered 2020-08-16 – 2020-08-29 (×14): 0.4 mg via ORAL
  Filled 2020-08-16 (×14): qty 1

## 2020-08-16 MED ORDER — IRBESARTAN 75 MG PO TABS
75.0000 mg | ORAL_TABLET | Freq: Every day | ORAL | Status: DC
  Administered 2020-08-17 – 2020-08-30 (×14): 75 mg via ORAL
  Filled 2020-08-16 (×15): qty 1

## 2020-08-16 NOTE — Progress Notes (Signed)
[facility] PHYSICAL MEDICINE & REHABILITATION PROGRESS NOTE   Subjective/Complaints:  Keith Downs reports slept much better- less cramping;  But peeing/voiding q3 hours- has enlarged prostate- no UTI per U/A results.  Not too sleepy on Baclofen increase; LBM last night.  Forgot to ask for zanaflex last night.     ROS: Keith Downs denies SOB, abd pain, CP, N/V/C/D, and vision changes   Objective:   No results found. Recent Labs    08/15/20 0839  WBC 13.4*  HGB 15.3  HCT 42.0  PLT 251   Recent Labs    08/15/20 0839  NA 136  K 4.0  CL 100  CO2 23  GLUCOSE 123*  BUN 29*  CREATININE 0.88  CALCIUM 9.2    Intake/Output Summary (Last 24 hours) at 08/16/2020 0848 Last data filed at 08/16/2020 0600 Gross per 24 hour  Intake --  Output 500 ml  Net -500 ml        Physical Exam: Vital Signs Blood pressure 107/70, pulse (!) 48, temperature 99 F (37.2 C), resp. rate 19, height 5\' 11"  (1.803 m), SpO2 98 %.  General: Awake, alert, appropriate, sitting up in bedside chair, NAD HENT: conjugate gaze- wearing Aspen collar; steristrips on R; collar not on since not moving) (neck- area slightly swollen- no erythema still)  Cardiovascular: bradycardic; regular rhythm Pulmonary: CTA B/L- no W/R/R- good air movement Abdominal: Soft, NT, ND, (+)BS  Musculoskeletal:        General: Swelling (1+ pedal edema bilaterally) present.     Comments: Left lower extremity tenderness  Skin:    Comments: Neck incision CDI with steri strips in place.   Neurological:  LLE MAS of 3- very difficult to move, but better than yesterday- MAS down to 3    Mental Status: He is alert.     Comments: Alert and oriented  Motor: RUE: 5/5 proximal distal LUE: 4 -/5 proximal to distal RLE: 4 --4/5 proximal distal LLE: 3+-4 -/5 hip flexion, knee extension, 2/5 ankle dorsiflexion Sensation diminished light touch LUE/LLE  Psychiatric:    appropriate    Assessment/Plan: 1. Functional deficits which require 3+ hours  per day of interdisciplinary therapy in a comprehensive inpatient rehab setting.  Physiatrist is providing close team supervision and 24 hour management of active medical problems listed below.  Physiatrist and rehab team continue to assess barriers to discharge/monitor patient progress toward functional and medical goals  Care Tool:  Bathing    Body parts bathed by patient: Left arm,Chest,Abdomen,Front perineal area,Buttocks,Right upper leg,Left upper leg,Face   Body parts bathed by helper: Right arm,Right lower leg,Left lower leg     Bathing assist Assist Level: Moderate Assistance - Patient 50 - 74%     Upper Body Dressing/Undressing Upper body dressing   What is the patient wearing?: Pull over shirt    Upper body assist Assist Level: Minimal Assistance - Patient > 75%    Lower Body Dressing/Undressing Lower body dressing      What is the patient wearing?: Underwear/pull up,Pants     Lower body assist Assist for lower body dressing: Maximal Assistance - Patient 25 - 49%     Toileting Toileting    Toileting assist Assist for toileting: Moderate Assistance - Patient 50 - 74%     Transfers Chair/bed transfer  Transfers assist     Chair/bed transfer assist level: Moderate Assistance - Patient 50 - 74%     Locomotion Ambulation   Ambulation assist      Assist level: 2 helpers (  w/c follow for safety) Assistive device: Other (comment) (R hand rail) Max distance: 39ft   Walk 10 feet activity   Assist     Assist level: 2 helpers Assistive device: Other (comment) (R hand rail)   Walk 50 feet activity   Assist Walk 50 feet with 2 turns activity did not occur: Safety/medical concerns         Walk 150 feet activity   Assist Walk 150 feet activity did not occur: Safety/medical concerns         Walk 10 feet on uneven surface  activity   Assist Walk 10 feet on uneven surfaces activity did not occur: Safety/medical concerns          Wheelchair     Assist Will patient use wheelchair at discharge?: No Type of Wheelchair: Manual    Wheelchair assist level: Supervision/Verbal cueing Max wheelchair distance: 120'    Wheelchair 50 feet with 2 turns activity    Assist        Assist Level: Supervision/Verbal cueing   Wheelchair 150 feet activity     Assist          Blood pressure 107/70, pulse (!) 48, temperature 99 F (37.2 C), resp. rate 19, height 5\' 11"  (1.803 m), SpO2 98 %.  Medical Problem List and Plan: 1.  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             -patient may not shower             -ELOS/Goals: 14-18 days/supervision/min a             Admit to CIR Improved 2.  Antithrombotics: -DVT/anticoagulation:  Mechanical: Sequential compression devices, below knee Bilateral lower extremities 1/20- will start Lovenox tomorrow- will be 7 days since surgery             -antiplatelet therapy: N/a 3. Pain Management: On oxycodone prn.    1/20- pain controlled- spasticity is not- as below             Monitor with increased exertion, particularly for neuropathic pain 4. Mood: LCSW to follow for evaluation and support.              -antipsychotic agents: N/A 5. Neuropsych: This patient is capable of making decisions on his own behalf. 6. Skin/Wound Care: Routine pressure relief measures 7. Fluids/Electrolytes/Nutrition: Monitor I/Os             CMP ordered for tomorrow a.m. 8. HTN: Monitor BP tid--continue HCTZ and Avapro. Monitor for orthostasis.   1/20- BP 127/84- doing well -con't regimen  1/21- BP 100s/70s- pulse 48- will decrease Avapro to 75 mg daily.              Monitor with increased mobility. 9. GERD: Resume PPI.  10. Hypothyroid: Continue supplement 11. COPD: Encourage pulmonary hygiene with IS and monitor respiratory status with increase in activity. Continue Dulera with albuterol prn  SOB. .  12. Spasticity: Baclofen tid-->may need titration upwards to help manage symptoms.   1/20- will increase Baclofen to 10 mg QID and then add prn Zanaflex 4 mg TID prn for increasing spasms, esp at night- explained can cause sedation/constipaiton. Of note, spasticity worse at night.   1/21- forgot to take zanaflex- will write to offer it- slept better with less cramping- will increase Baclofen Sat/Sunday 13. Leukocytosis  1/20- WBC 13.4- but on decadron- will check U/A and Cx to  make sure doesn't have UTI.    1/21- U/A (-)- is from decadron 14. Frequent urination  1/21- hx of prostate enlargement- will start Flomax -.4 mg q supper- since sounds like not emptying.    LOS: 2 days A FACE TO FACE EVALUATION WAS PERFORMED  Page Pucciarelli 08/16/2020, 8:48 AM

## 2020-08-16 NOTE — Care Management (Signed)
[facility] Individual Statement of Services  Patient Name:  Keith Downs  Date:  08/16/2020  Welcome to the [facility].  Our goal is to provide you with an individualized program based on your diagnosis and situation, designed to meet your specific needs.  With this comprehensive rehabilitation program, you will be expected to participate in at least 3 hours of rehabilitation therapies Monday-Friday, with modified therapy programming on the weekends.  Your rehabilitation program will include the following services:  Physical Therapy (PT), Occupational Therapy (OT), 24 hour per day rehabilitation nursing, Therapeutic Recreaction (TR), Psychology, Neuropsychology, Care Coordinator, Rehabilitation Medicine, Nutrition Services, Pharmacy Services and Other  Weekly team conferences will be held on Tuesdays to discuss your progress.  Your Inpatient Rehabilitation Care Coordinator will talk with you frequently to get your input and to update you on team discussions.  Team conferences with you and your family in attendance may also be held.  Expected length of stay: 3 weeks    Overall anticipated outcome: Supervision  Depending on your progress and recovery, your program may change. Your Inpatient Rehabilitation Care Coordinator will coordinate services and will keep you informed of any changes. Your Inpatient Rehabilitation Care Coordinator's name and contact numbers are listed  below.  The following services may also be recommended but are not provided by the Inpatient [facility] Center:   Driving Evaluations  Home Health Rehabiltiation Services  Outpatient Rehabilitation Services  Vocational Rehabilitation   Arrangements will be made to provide these services after discharge if needed.  Arrangements include referral to agencies that provide these services.  Your insurance has been verified to be:  BCBS  Your primary doctor is:  [facility]  Pertinent information will be shared with your doctor and your insurance company.  Inpatient Rehabilitation Care Coordinator:  Susie Cassette [phone] or [phone]  Information discussed with and copy given to patient by: Gretchen Short, 08/16/2020, 12:22 PM

## 2020-08-16 NOTE — Progress Notes (Signed)
Patient ID: Keith Downs, male   DOB: Oct 01, 1960, 60 y.o.   MRN: [id number]   SW met with pt and pt wife in room to review statement of service form. No questions or concerns reported.   Loralee Pacas, MSW, [place] Office: [phone] Cell: [phone] Fax: [phone]

## 2020-08-16 NOTE — Progress Notes (Signed)
Physical Therapy Session Note  Patient Details  Name: Keith Downs MRN: [id number] Date of Birth: 10/31/1960  Today's Date: 08/16/2020 PT Individual Time: 1420-1535 PT Individual Time Calculation (min): 75 min   Short Term Goals: Week 1:  PT Short Term Goal 1 (Week 1): Pt will compete bed mobility with minA and no bed features PT Short Term Goal 2 (Week 1): Pt will complete bed<>chair transfers with minA and LRAD PT Short Term Goal 3 (Week 1): Pt will ambulate 64ft with modA and LRAD  Skilled Therapeutic Interventions/Progress Updates: Pt presented in w/c agreeable to therapy. Pt denies pain during session. Per Darrick Meigs PT, pt's family brought in own AFO. PTA was able to don AFO and then transported to rehab gym. Participated in gait training with RW, first attempt 72ft with RW and modA with narrow BOS. Pt with increased difficulty advancing LLE. On second and third trials PTA donned shoe cover and pt was able to increase ambulation distance to 75ft and 87ft with modA respectively. Pt required cues for shortening step length, safe use of RW, increasing abduction of LLE during swing through, and awareness of TKE in stance phase. Pt then participated in standing dynamic balance performing ball taps with RW. Pt required mod/max multimodal cues for TKE of LLE however when L lean became more substantial (but not LOB) pt was able to correct. PTA placed mirror within line of sight and performed same activity with improved awareness and minA for balance. Participated in sitting balance activities including ball taps with 2lb dowel 2 x 30 reps with min challenges but pt was able to maintain supervision level with no LOB. Pt then performed stand pivot to w/c to L with modA and transported to day room. Performed stand pivot to L with modA and participated in NuStep L3 x 10 min for general conditioning. Pt was able to maintain ~ 60-70 SPM. Returned to w/c via stand pivot and transported back to room.  Performed squat pivot to recliner with minA. Pt left in recliner at end of session with belt alarm on, call bell within reach and needs met.      Therapy Documentation Precautions:  Precautions Precautions: Cervical,Fall Required Braces or Orthoses: Cervical Brace Cervical Brace: Hard collar Restrictions Weight Bearing Restrictions: No Other Position/Activity Restrictions: Hard collar when OOB, may apply sitting EOB General:   Vital Signs: Therapy Vitals Temp: 98 F (36.7 C) Pulse Rate: 75 Resp: 20 BP: 118/75 Patient Position (if appropriate): Sitting Oxygen Therapy SpO2: 99 % O2 Device: Room Air Pain:   Mobility:   Locomotion :    Trunk/Postural Assessment :    Balance:   Exercises:   Other Treatments:      Therapy/Group: Individual Therapy  Delorese Sellin 08/16/2020, 4:13 PM

## 2020-08-16 NOTE — Progress Notes (Signed)
Physical Therapy Session Note  Patient Details  Name: Keith Downs MRN: [id number] Date of Birth: 11-29-1960  Today's Date: 08/16/2020 PT Individual Time: 0850-1015 PT Individual Time Calculation (min): 85 min   Short Term Goals: Week 1:  PT Short Term Goal 1 (Week 1): Pt will compete bed mobility with minA and no bed features PT Short Term Goal 2 (Week 1): Pt will complete bed<>chair transfers with minA and LRAD PT Short Term Goal 3 (Week 1): Pt will ambulate 75ft with modA and LRAD  Skilled Therapeutic Interventions/Progress Updates:    Pt received sitting upright in recliner, agreeable and motivated to participate in therapy. No reports of pain. Hard collar not on with arrival and educated patient on MD order for hard collar when OOB as well as general cervical spinal precautions.   Donned brief and lower body dressing with totalA for threading and maxA for pulling over hips in standing while requiring modA for standing balance while unsupported. He was able to put T-shirt on with setupA while seated in chair. Stand<>pivot with modA from recliner to w/c, requiring cues for stepping pattern and safety approach. Pt propelled himself with supervision from his room to main therapy gym, ~17ft, using BUE's only.  Introduced squat<>pivot transfers and educated on technique. Performed repeated squat<>pivot transfers progressing from modA to minA from w/c to mat table. Education on head/hip, foot and hand placement. He required minA towards end of boosting hips to clear the gap but was quick to learn the general technique. Practiced lateral scooting along mat table with CGA, multiple bouts L<>R, focusing on head/hip, forward weight shift, and hip translation.   Performed sit<>stand with modA from mat table and performed unsupported dyanamic standing balance with task overlay for reaching with LUE to horsehoes on top of mirror. He ultimately had great difficulty with this sequencing and he  would focus more so on getting the horseshoes than focusing on balance. Therefore, provided mirror for visual feedback and focused on static standing with minA but pt fearful of falling and had difficulty with A/P weight shifting.   Performed pre-gait training with RW support and minA, forward/backward stepping with L foot onto target. He has significant difficulty isolating knee flexion/hip extension and compensates with hip hiking and thoracic rotation. Very poor L foot clearance with backward stepping.   Sit>supine with minA onto mat table with wedge and pillow provided for cervical support. Performed the following supine there-ex: -4x10 bridges -proximal and distal hamstring stretches, prolonged to resistance -heel cord stretches, prolonged to resistance -piriformis and hip external rotator stretches -3x10 knee to chest AAROM for LLE  Required modA for supine<>sit via log rolling technique at mat table. Squat<>pivot with minA to w/c and wheeled inside // bars.  Able to sit<>stand with CGA with BUE's to bars. Performed gait training, ambulating length of // bars forwards/bacwakrds twice, ~63ft, with minA. Continues to demo L knee flexion in stance, decreased L foot clearance, decreased L step length, adduction with mild scissoring, forward flexed trunk. Using mirror for visual feedback to improve postural awareness and L knee awareness to reduce flexion. Performed side stepping L<>R along length of // bars but required modA for stepping L due to significant difficulty lifting his L foot with hip hiking compensation. Required cues for lateral weight shifts to offload to improve foot clearance.   Performed static standing toe taps with L foot on 1 inch block, able to complete x5 reps prior to fatigue and very limited L hip flexion with ability  to place L foot on platform.  Ended session by performing repeated sit<>stands with minA with BUE's to w/c armrests, cues for forward weight shift and  general technique.  He then propelled himself with supervision in w/c back to his room, squat<>pivot with minA from w/c to recliner. He remained seated in recliner with needs in reach, made comfortable. Pt appreciative.  Therapy Documentation Precautions:  Precautions Precautions: Cervical,Fall Required Braces or Orthoses: Cervical Brace Cervical Brace: Hard collar Restrictions Weight Bearing Restrictions: No Other Position/Activity Restrictions: Hard collar when OOB, may apply sitting EOB  Therapy/Group: Individual Therapy  Moody Robben P Cordero Surette PT 08/16/2020, 7:34 AM

## 2020-08-16 NOTE — Progress Notes (Signed)
Occupational Therapy Session Note  Patient Details  Name: Keith Downs MRN: [id number] Date of Birth: 1961-05-18  Today's Date: 08/16/2020 OT Individual Time: 1115-1200 OT Individual Time Calculation (min): 45 min    Short Term Goals: Week 1:  OT Short Term Goal 1 (Week 1): Pt will demonstrate improved balance to be able to stand with RW and release 1 hand to pull pants up with min A. OT Short Term Goal 2 (Week 1): Pt will be able to complete squat pivot to toilet with mod A of 1. OT Short Term Goal 3 (Week 1): Pt will be able to don shirt over head with set up. OT Short Term Goal 4 (Week 1): Pt will be able to don pants over feet with min A with AE as needed.  Skilled Therapeutic Interventions/Progress Updates:    Pt received in recliner requesting to toilet. He was able to perform a squat pivot to wc and then to toilet with min A.  Sit to stand with bar with mod A to stabilize his L Leg.  Pt stood with using R hand on bar and was able to partially pull down pants on L side. He toileted and then stood for pants to be pulled up with max and then sat back down for squat pivot back to wc with min.  Sat at sink to complete oral care with set up.   Pt's wife arrived and brought clean clothing.  Pt changed clothing using sit to stands with bed rail for support with mod A and mod - max to doff and don pants. Improved core control and LLE stability.  Assessed grasp strength: L 20 lbs and R 55 lbs Provided pt with med soft theraputty and he practiced squeezing it.  Encouraged him to use it through out the day.   Pt resting in wc to prepare for lunch with belt alarm on and wife in the room.    Pt very motivated.    Therapy Documentation Precautions:  Precautions Precautions: Cervical,Fall Required Braces or Orthoses: Cervical Brace Cervical Brace: Hard collar Restrictions Weight Bearing Restrictions: No Other Position/Activity Restrictions: Hard collar when OOB, may apply sitting  EOB  Pain: Pain Assessment Pain Scale: 0-10 Pain Score: 0-No pain    Therapy/Group: Individual Therapy  Blease Capaldi 08/16/2020, 12:42 PM

## 2020-08-17 LAB — URINE CULTURE: Culture: <10000 — AB

## 2020-08-17 MED ORDER — PANTOPRAZOLE SODIUM 40 MG PO TBEC
40.0000 mg | DELAYED_RELEASE_TABLET | Freq: Two times a day (BID) | ORAL | Status: DC
  Administered 2020-08-17 – 2020-08-30 (×26): 40 mg via ORAL
  Filled 2020-08-17 (×26): qty 1

## 2020-08-17 MED ORDER — TIZANIDINE HCL 4 MG PO TABS
4.0000 mg | ORAL_TABLET | Freq: Every day | ORAL | Status: DC
  Administered 2020-08-17 – 2020-08-29 (×13): 4 mg via ORAL
  Filled 2020-08-17 (×13): qty 1

## 2020-08-17 NOTE — Progress Notes (Signed)
Restless night, slept 4-5 hours. Trouble getting situated in bed. PRN ultam given at 0133. Agreed to try PRN trazodone tonight. Decreased grip to left hand, patient reports better since surgery. Using urinal with assist. Assisted to BR this morning, hematuria observed and patient complained of burning with urination. Wore left PRAFO most of night. Alfredo Martinez A

## 2020-08-17 NOTE — Progress Notes (Signed)
[facility] PHYSICAL MEDICINE & REHABILITATION PROGRESS NOTE   Subjective/Complaints:  Pt hasn't asked for Zanaflex prn yet- at all- will schedule at night- otherwise leave prn.   Pt reports having blood in stool- and they "looked for tear', but nursing note said had hematuria and dysuria- pt denies those issues.  Last U/A was 1/20- will monitor [place] Spaniel does have hemorrhoids, but doesn't think it's an issue right now.   Also, reflux is worse- asked for increase in PPI.   ROS:  Pt denies SOB, abd pain, CP, N/V/C/D, and vision changes   Objective:   No results found. Recent Labs    08/15/20 0839  WBC 13.4*  HGB 15.3  HCT 42.0  PLT 251   Recent Labs    08/15/20 0839  NA 136  K 4.0  CL 100  CO2 23  GLUCOSE 123*  BUN 29*  CREATININE 0.88  CALCIUM 9.2    Intake/Output Summary (Last 24 hours) at 08/17/2020 1429 Last data filed at 08/17/2020 1134 Gross per 24 hour  Intake 600 ml  Output 2075 ml  Net -1475 ml        Physical Exam: Vital Signs Blood pressure (!) 118/92, pulse 66, temperature 98.2 F (36.8 C), temperature source Oral, resp. rate 17, height 5\' 11"  (1.803 m), SpO2 99 %.  General: awake, alert, appropriate, sing song voice, not wearing collar/in bedside chair, NAD HENT: conjugate gaze-streristrips on incision- looks OK- a little swollen Cardiovascular: RRR Pulmonary: CTA B/L- no W/R/R- good air movement Abdominal: Soft, NT, ND, (+)BS  Musculoskeletal:        General: Swelling (1+ pedal edema bilaterally) present.     Comments: Left lower extremity tenderness  Skin:    Comments: Neck incision CDI with steri strips in place.   Neurological:  LLE MAS of 3- very difficult to move, but better than yesterday- MAS down to 3 in LLE- stable-    Mental Status: He is alert.     Comments: Alert and oriented  Motor: RUE: 5/5 proximal distal LUE: 4 -/5 proximal to distal RLE: 4 --4/5 proximal distal LLE: 3+-4 -/5 hip flexion, knee extension, 2/5 ankle  dorsiflexion Sensation diminished light touch LUE/LLE  Psychiatric:  appropriate, but sing song voice    Assessment/Plan: 1. Functional deficits which require 3+ hours per day of interdisciplinary therapy in a comprehensive inpatient rehab setting.  Physiatrist is providing close team supervision and 24 hour management of active medical problems listed below.  Physiatrist and rehab team continue to assess barriers to discharge/monitor patient progress toward functional and medical goals  Care Tool:  Bathing    Body parts bathed by patient: Left arm,Chest,Abdomen,Front perineal area,Buttocks,Right upper leg,Left upper leg,Face   Body parts bathed by helper: Right arm,Right lower leg,Left lower leg     Bathing assist Assist Level: Moderate Assistance - Patient 50 - 74%     Upper Body Dressing/Undressing Upper body dressing   What is the patient wearing?: Pull over shirt    Upper body assist Assist Level: Supervision/Verbal cueing    Lower Body Dressing/Undressing Lower body dressing      What is the patient wearing?: Underwear/pull up,Pants     Lower body assist Assist for lower body dressing: Moderate Assistance - Patient 50 - 74%     Toileting Toileting    Toileting assist Assist for toileting: Moderate Assistance - Patient 50 - 74%     Transfers Chair/bed transfer  Transfers assist     Chair/bed transfer assist level: Moderate  Assistance - Patient 50 - 74%     Locomotion Ambulation   Ambulation assist      Assist level: 2 helpers (w/c follow for safety) Assistive device: Other (comment) (R hand rail) Max distance: 75ft   Walk 10 feet activity   Assist     Assist level: 2 helpers Assistive device: Other (comment) (R hand rail)   Walk 50 feet activity   Assist Walk 50 feet with 2 turns activity did not occur: Safety/medical concerns         Walk 150 feet activity   Assist Walk 150 feet activity did not occur: Safety/medical  concerns         Walk 10 feet on uneven surface  activity   Assist Walk 10 feet on uneven surfaces activity did not occur: Safety/medical concerns         Wheelchair     Assist Will patient use wheelchair at discharge?: No Type of Wheelchair: Manual    Wheelchair assist level: Supervision/Verbal cueing Max wheelchair distance: 120'    Wheelchair 50 feet with 2 turns activity    Assist        Assist Level: Supervision/Verbal cueing   Wheelchair 150 feet activity     Assist          Blood pressure (!) 118/92, pulse 66, temperature 98.2 F (36.8 C), temperature source Oral, resp. rate 17, height 5\' 11"  (1.803 m), SpO2 99 %.  Medical Problem List and Plan: 1.  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             -patient may not shower             -ELOS/Goals: 14-18 days/supervision/min a  1/22- MAY shower- just cover incision              Admit to CIR Improved 2.  Antithrombotics: -DVT/anticoagulation:  Mechanical: Sequential compression devices, below knee Bilateral lower extremities 1/20- will start Lovenox tomorrow- will be 7 days since surgery  1/22- started lovenox             -antiplatelet therapy: N/a 3. Pain Management: On oxycodone prn.    1/20- pain controlled- spasticity is not- as below             Monitor with increased exertion, particularly for neuropathic pain 4. Mood: LCSW to follow for evaluation and support.              -antipsychotic agents: N/A 5. Neuropsych: This patient is capable of making decisions on his own behalf. 6. Skin/Wound Care: Routine pressure relief measures 7. Fluids/Electrolytes/Nutrition: Monitor I/Os             CMP ordered for tomorrow a.m. 8. HTN: Monitor BP tid--continue HCTZ and Avapro. Monitor for orthostasis.   1/20- BP 127/84- doing well -con't regimen  1/21- BP 100s/70s- pulse 48- will decrease Avapro to 75  mg daily.  1/22- BP 118/72- con't regimen              Monitor with increased mobility. 9. GERD: Resume PPI.   1/22- increase to BID per pt request from Sx's.  10. Hypothyroid: Continue supplement 11. COPD: Encourage pulmonary hygiene with IS and monitor respiratory status with increase in activity. Continue Dulera with albuterol prn SOB. .  12. Spasticity: Baclofen tid-->may need titration upwards to help manage symptoms.   1/20- will increase Baclofen to 10 mg QID and then add prn  Zanaflex 4 mg TID prn for increasing spasms, esp at night- explained can cause sedation/constipaiton. Of note, spasticity worse at night.   1/21- forgot to take zanaflex- will write to offer it- slept better with less cramping- will increase Baclofen Sat/Sunday  1/22- forgetting Zanaflex- will scheduled 4 mg QHS as well as baclofen 13. Leukocytosis  1/20- WBC 13.4- but on decadron- will check U/A and Cx to make sure doesn't have UTI.    1/21- U/A (-)- is from decadron  1/22- questionable dysuria last night- pt denied? 14. Frequent urination  1/21- hx of prostate enlargement- will start Flomax -.4 mg q supper- since sounds like not emptying.  1/22- says slightly better     LOS: 3 days A FACE TO FACE EVALUATION WAS PERFORMED  Kyah Buesing 08/17/2020, 2:29 PM

## 2020-08-17 NOTE — IPOC Note (Signed)
Overall Plan of Care [facility]) Patient Details Name: Keith Downs MRN: [id number] DOB: [birth year]  Admitting Diagnosis: Cervical myelopathy [facility])  Hospital Problems: Principal Problem:   Cervical myelopathy (HCC)     Functional Problem List: Nursing Behavior,Bladder,Bowel,Edema,Endurance,Motor,Pain,Safety,Skin Integrity  PT Balance,Endurance,Motor,Safety,Perception,Sensory,Skin Integrity  OT Balance,Endurance,Motor,Safety,Sensory  SLP    TR         Basic ADL's: OT Bathing,Dressing,Toileting     Advanced  ADL's: OT       Transfers: PT Bed Mobility,Bed to Chair,Car,Furniture  OT Toilet,Tub/Shower     Locomotion: PT Ambulation,Stairs     Additional Impairments: OT None  SLP        TR      Anticipated Outcomes Item Anticipated Outcome  Self Feeding no goal, pt is independent  Swallowing      Basic self-care  supervision  Toileting  supervision   Bathroom Transfers supervision  Bowel/Bladder  remain continent, maintain regular pattern of emptying  Transfers  supervision with LRAD  Locomotion  supervision with LRAD  Communication     Cognition     Pain  less than 5  Safety/Judgment  no falls, skin breakdown and infection while on rehab   Therapy Plan: PT Intensity: Minimum of 1-2 x/day ,45 to 90 minutes PT Frequency: 5 out of 7 days PT Duration Estimated Length of Stay: 3 weeks OT Intensity: Minimum of 1-2 x/day, 45 to 90 minutes OT Frequency: 5 out of 7 days OT Duration/Estimated Length of Stay: 20-21 days     Due to the current state of emergency, patients may not be receiving their 3-hours of Medicare-mandated therapy.   Team Interventions: Nursing Interventions Patient/Family Education,Pain Management,Bladder Management,Bowel Management,Skin Care/Wound Management,Disease Management/Prevention,Discharge Planning,Psychosocial Support  PT interventions Ambulation/gait training,Balance/vestibular training,Cognitive  remediation/compensation,Community reintegration,Discharge planning,Disease Social research officer, government re-education,Functional mobility training,Pain management,Patient/family education,Psychosocial support,Skin care/wound management,Splinting/orthotics,Stair training,Therapeutic Activities,Therapeutic Exercise,UE/LE Strength taining/ROM,UE/LE Coordination activities,Visual/perceptual remediation/compensation,Wheelchair propulsion/positioning  OT Interventions Balance/vestibular training,Discharge planning,Functional mobility training,DME/adaptive equipment instruction,Neuromuscular re-education,Pain management,Patient/family education,Psychosocial support,Therapeutic Activities,Self Care/advanced ADL retraining,Therapeutic Exercise,UE/LE Strength taining/ROM,UE/LE Coordination activities,Wheelchair propulsion/positioning  SLP Interventions    TR Interventions    SW/CM Interventions Discharge Planning,Psychosocial Support,Patient/Family Education   Barriers to Discharge MD  Medical stability, Home enviroment access/loayout, Wound care, Lack of/limited family support, Weight bearing restrictions, Medication compliance and Behavior  Nursing Decreased caregiver support    PT Home environment access/layout,Insurance for SNF coverage    OT      SLP      SW       Team Discharge Planning: Destination: PT-Home ,OT- Home , SLP-  Projected Follow-up: PT-24 hour supervision/assistance, OT-  Outpatient OT, SLP-  Projected Equipment Needs: PT-To be determined, OT- Tub/shower seat, SLP-  Equipment Details: PT-Pt owns a SPC and RW, OT-  Patient/family involved in discharge planning: PT- Patient,  OT-Patient, SLP-   MD ELOS: ~ 3 weeks Medical Rehab Prognosis:  Good Assessment: Pt is a 60 yr old with cervical myelopathy as well as significant spasticity of LLE- have increased baclofen and added Zanaflex QHS.  Also having some  frequent urination- pt thinks retaining- added flomax and increased PPI to BID due to GERD Sx's.    Goals- supervision, hopefully, by d/c.    See Team Conference Notes for weekly updates to the plan of care

## 2020-08-18 ENCOUNTER — Inpatient Hospital Stay (HOSPITAL_COMMUNITY): Payer: BC Managed Care – PPO | Admitting: Occupational Therapy

## 2020-08-18 ENCOUNTER — Inpatient Hospital Stay (HOSPITAL_COMMUNITY): Payer: BC Managed Care – PPO | Admitting: Physical Therapy

## 2020-08-18 NOTE — Progress Notes (Signed)
Slept/rested better last night, with scheduled meds. Offered PRN  Trazodone at HS, patient declined. Alfredo Martinez A

## 2020-08-18 NOTE — Progress Notes (Signed)
[place] PHYSICAL MEDICINE & REHABILITATION PROGRESS NOTE   Subjective/Complaints:  [name] reports scheduled Znaflex helped tone a LOT- and sleep- didn't ask for trazodone last night/didn't take it.   Slept for a full 6 hrs overnight.  Much less tightness in LLE this AM.  Moving a lot better with therapy Peeing more at 1 time- 350cc or so each time, so not needing to void as often.   When lays down, throat/anterior neck feels a little tight.    ROS:  Pt denies SOB, abd pain, CP, N/V/C/D, and vision changes  Objective:   No results found. No results for input(s): WBC, HGB, HCT, PLT in the last 72 hours. No results for input(s): NA, K, CL, CO2, GLUCOSE, BUN, CREATININE, CALCIUM in the last 72 hours.  Intake/Output Summary (Last 24 hours) at 08/18/2020 1449 Last data filed at 08/18/2020 0717 Gross per 24 hour  Intake 720 ml  Output 2075 ml  Net -1355 ml        Physical Exam: Vital Signs Blood pressure 108/60, pulse (!) 51, temperature 97.9 F (36.6 C), resp. rate 20, height 5\' 11"  (1.803 m), SpO2 99 %.  General: sitting up in bedside chair, appropriate, NAD HENT: conjugate gaze-streristrips on incision- less swollen Cardiovascular: bradycardic Pulmonary: CTA B/L- no W/R/R- good air movement Abdominal: Soft, NT, ND, (+)BS  Musculoskeletal:        General: Swelling (1+ pedal edema bilaterally) present.     Comments: Left lower extremity tenderness  Skin:    Comments: Neck incision CDI with steri strips in place.   Neurological:  LLE MAS of 3- but less than yesterday    Mental Status: He is alert- but can be tangential     Comments: Alert and oriented  Motor: RUE: 5/5 proximal distal LUE: 4 -/5 proximal to distal RLE: 4 --4/5 proximal distal LLE: 3+-4 -/5 hip flexion, knee extension, 2/5 ankle dorsiflexion Sensation diminished light touch LUE/LLE  Psychiatric: appropriate    Assessment/Plan: 1. Functional deficits which require 3+ hours per day of  interdisciplinary therapy in a comprehensive inpatient rehab setting.  Physiatrist is providing close team supervision and 24 hour management of active medical problems listed below.  Physiatrist and rehab team continue to assess barriers to discharge/monitor patient progress toward functional and medical goals  Care Tool:  Bathing    Body parts bathed by patient: Left arm,Chest,Abdomen,Front perineal area,Right upper leg,Left upper leg,Face,Right arm,Right lower leg   Body parts bathed by helper: Left lower leg,Buttocks     Bathing assist Assist Level: Minimal Assistance - Patient > 75%     Upper Body Dressing/Undressing Upper body dressing   What is the patient wearing?: Pull over shirt    Upper body assist Assist Level: Supervision/Verbal cueing    Lower Body Dressing/Undressing Lower body dressing      What is the patient wearing?: Pants     Lower body assist Assist for lower body dressing: Moderate Assistance - Patient 50 - 74%     Toileting Toileting    Toileting assist Assist for toileting: Moderate Assistance - Patient 50 - 74%     Transfers Chair/bed transfer  Transfers assist     Chair/bed transfer assist level: Maximal Assistance - Patient 25 - 49%     Locomotion Ambulation   Ambulation assist      Assist level: Minimal Assistance - Patient > 75% Assistive device: Walker-rolling Max distance: 35 ft   Walk 10 feet activity   Assist     Assist  level: Minimal Assistance - Patient > 75% Assistive device: Walker-rolling   Walk 50 feet activity   Assist Walk 50 feet with 2 turns activity did not occur: Safety/medical concerns         Walk 150 feet activity   Assist Walk 150 feet activity did not occur: Safety/medical concerns         Walk 10 feet on uneven surface  activity   Assist Walk 10 feet on uneven surfaces activity did not occur: Safety/medical concerns         Wheelchair     Assist Will patient use  wheelchair at discharge?: No Type of Wheelchair: Manual    Wheelchair assist level: Supervision/Verbal cueing Max wheelchair distance: 120'    Wheelchair 50 feet with 2 turns activity    Assist        Assist Level: Supervision/Verbal cueing   Wheelchair 150 feet activity     Assist          Blood pressure 108/60, pulse (!) 51, temperature 97.9 F (36.6 C), resp. rate 20, height 5\' 11"  (1.803 m), SpO2 99 %.  Medical Problem List and Plan: 1.  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             -patient may not shower             -ELOS/Goals: 14-18 days/supervision/min a  1/22- MAY shower- just cover incision              Admit to CIR Improved 2.  Antithrombotics: -DVT/anticoagulation:  Mechanical: Sequential compression devices, below knee Bilateral lower extremities  1/22- started lovenox             -antiplatelet therapy: N/a 3. Pain Management: On oxycodone prn.    1/20- pain controlled- spasticity is not- as below  1/23- pain is controlled- spasticity main issue             Monitor with increased exertion, particularly for neuropathic pain 4. Mood: LCSW to follow for evaluation and support.              -antipsychotic agents: N/A 5. Neuropsych: This patient is capable of making decisions on his own behalf. 6. Skin/Wound Care: Routine pressure relief measures 7. Fluids/Electrolytes/Nutrition: Monitor I/Os             CMP ordered for tomorrow a.m. 8. HTN: Monitor BP tid--continue HCTZ and Avapro. Monitor for orthostasis.   1/20- BP 127/84- doing well -con't regimen  1/21- BP 100s/70s- pulse 48- will decrease Avapro to 75 mg daily.  1/22- BP 118/72- con't regimen   1/23- BP 108/60- and pulse 51- denies orthostasis- but does have bradycardia- con't to monitor             Monitor with increased mobility. 9. GERD: Resume PPI.   1/22- increase to BID per pt request from  Sx's.  10. Hypothyroid: Continue supplement 11. COPD: Encourage pulmonary hygiene with IS and monitor respiratory status with increase in activity. Continue Dulera with albuterol prn SOB. .  12. Spasticity: Baclofen tid-->may need titration upwards to help manage symptoms.   1/20- will increase Baclofen to 10 mg QID and then add prn Zanaflex 4 mg TID prn for increasing spasms, esp at night- explained can cause sedation/constipaiton. Of note, spasticity worse at night.   1/21- forgot to take zanaflex- will write to offer it- slept better with less cramping- will increase Baclofen Sat/Sunday  1/22- forgetting Zanaflex- will scheduled 4 mg QHS as well as baclofen  1/23- spasticity doing better- once discharged, can possibly arrange for Botox toxin after d/c.  13. Leukocytosis  1/20- WBC 13.4- but on decadron- will check U/A and Cx to make sure doesn't have UTI.    1/21- U/A (-)- is from decadron  1/22- questionable dysuria last night- pt denied?  1/23- pt denies dysuria today and hematuria- but sounds like he had yesterday base don his language now.  14. Frequent urination  1/21- hx of prostate enlargement- will start Flomax -.4 mg q supper- since sounds like not emptying.  1/23- peeing ~350cc at a time- so less often- con't flomax     LOS: 4 days A FACE TO FACE EVALUATION WAS PERFORMED  Keith Downs 08/18/2020, 2:49 PM

## 2020-08-18 NOTE — Progress Notes (Signed)
Physical Therapy Session Note  Patient Details  Name: Keith Downs MRN: [id number] Date of Birth: 1960-09-23  Today's Date: 08/18/2020 PT Individual Time: 2563-8937, 1430-1530 PT Individual Time Calculation (min): 45 min, 60 min  Short Term Goals: Week 1:  PT Short Term Goal 1 (Week 1): Pt will compete bed mobility with minA and no bed features PT Short Term Goal 2 (Week 1): Pt will complete bed<>chair transfers with minA and LRAD PT Short Term Goal 3 (Week 1): Pt will ambulate 35ft with modA and LRAD  Skilled Therapeutic Interventions/Progress Updates:    Session 1: Pt seated in recliner and is agreeable to therapy. Pt requires minA with RLE dressing and total assist with LLE dressing, set up assist with UB dressing. STS transfer minA with RW. Pt able to walk to wc from recliner and turn with CGA, toe cap, L AFO, and VCs and  for walker positioning. Pt ambulates with narrow BOS, flexed trunk and looks at feet, given VCs for improved gait mechanics. Gait 4x35 ft with CGA and RW. Pt fatigues quickly and has difficulty lifting his LLE. Pt with difficulty lifting L foot to perform step taps on 3 in step.  Stepping to floor level target with CGA, RW, and neuromuscular facilitation (tapping) on L quad. Pt transported back to room and walked from wc to recliner. Pt left with all needs in reach.    Session 2: Pt seated in recliner on arrival, STS min A with CGA to ambulate to wc. Pt propelled wc to gym with BUE x100 ft and transferred to mat table with CGA. Pt had difficulty propelling with L hand, added theraband for improved grip. Performed supine exercises including bridge and SLR 3 x 10. Squat to tap table (3x5) with CGA and verbal and visual cues (mirror) to put equal weight through each leg and keep L knee over toe. Pt performed seated marches, 3x10, and seated heel slides 2x10. Pt demonstrated good postural control in sitting while playing volley ball with 3lb bar, 2 x 20. Pt performed 2x7 ft  of side stepping each direction with VCs to pick up the L foot and CGA. Pt ambulated 2 x 50 ft with a seated rest break before propelling wc 100 ft back to room. Pt ambulated to recliner with CGA and was left with chair alarm active and all needs in reach.   Therapy Documentation Precautions:  Precautions Precautions: Cervical,Fall Required Braces or Orthoses: Cervical Brace Cervical Brace: Hard collar Restrictions Weight Bearing Restrictions: No Other Position/Activity Restrictions: Hard collar when OOB, may apply sitting EOB    Therapy/Group: Individual Therapy  Dyane Dustman, SPT 08/18/2020, 8:58 AM

## 2020-08-18 NOTE — Progress Notes (Signed)
Occupational Therapy Session Note  Patient Details  Name: Keith Downs MRN: [id number] Date of Birth: May 26, 1961  Today's Date: 08/18/2020 OT Individual Time: 9371-6967 OT Individual Time Calculation (min): 72 min    Short Term Goals: Week 1:  OT Short Term Goal 1 (Week 1): Pt will demonstrate improved balance to be able to stand with RW and release 1 hand to pull pants up with min A. OT Short Term Goal 2 (Week 1): Pt will be able to complete squat pivot to toilet with mod A of 1. OT Short Term Goal 3 (Week 1): Pt will be able to don shirt over head with set up. OT Short Term Goal 4 (Week 1): Pt will be able to don pants over feet with min A with AE as needed.  Skilled Therapeutic Interventions/Progress Updates:    Pt greeted in the recliner with no c/o pain, wearing the Lt AFO. Pt motivated to shower today. Squat pivot<w/c completed with Mod A and Mod A for squat pivot<TTB using the grab bar. Pt able to lean Lt>Rt for doffing pants. Per MD order, cervical brace removed for shower, pt cued to maintain neutral cervical alignment. OT assisted him with hair washing, pt able to wash the Rt underarm using his Lt hand. Pt able to assist with obtaining figure 4 position for the Lt LE to wash foot. Lateral leans for perihygiene. C-collar applied again and squat pivot<w/c completed with Mod A where pt then proceeded with dressing tasks sit<stand at the sink. He required Total A for Teds and Lt shoe + AFO component. Pt able to don the Rt shoe with Min A. He would benefit from trialing a shoe horn. Max A for standing balance while he elevated pants over hips, note that he was able to thread both LEs into pants dressing the Lt LE first given Min A beforehand. Pt with active Lt knee buckling during stands, required knee blocked for safety. Supervision for donning overhead shirt over c-collar. Pt completed oral care and handwashing with supervision, pt initiating incorporation of his affected Lt hand. Mod  A for stand pivot<recliner after and OT provided him with 2 additional HEPs for the room focusing on strength/coordination of the Lt hand. Reviewed HEPs together. Pt appreciative of education, remained in the recliner with all needs within reach and safety belt fastened. Tx focus placed on Lt NMR, sit<stands, functional transfers, pt education, and ADL retraining.  Therapy Documentation Precautions:  Precautions Precautions: Cervical,Fall Required Braces or Orthoses: Cervical Brace Cervical Brace: Hard collar Restrictions Weight Bearing Restrictions: No Other Position/Activity Restrictions: Hard collar when OOB, may apply sitting EOB ADL: ADL Eating: Independent Grooming: Setup Upper Body Bathing: Minimal assistance Lower Body Bathing: Moderate assistance Upper Body Dressing: Minimal assistance Lower Body Dressing: Maximal assistance Toileting: Moderate assistance Toilet Transfer: Moderate assistance Toilet Transfer Method: Other (comment) (stedy)      Therapy/Group: Individual Therapy  Joshuwa Vecchio A Lindsie Simar 08/18/2020, 12:36 PM

## 2020-08-19 ENCOUNTER — Inpatient Hospital Stay (HOSPITAL_COMMUNITY): Payer: BC Managed Care – PPO

## 2020-08-19 DIAGNOSIS — R252 Cramp and spasm: Secondary | ICD-10-CM

## 2020-08-19 DIAGNOSIS — E871 Hypo-osmolality and hyponatremia: Secondary | ICD-10-CM

## 2020-08-19 LAB — BASIC METABOLIC PANEL
Anion gap: 9 (ref 5–15)
BUN: 22 mg/dL — ABNORMAL HIGH (ref 6–20)
CO2: 25 mmol/L (ref 22–32)
Calcium: 8.9 mg/dL (ref 8.9–10.3)
Chloride: 92 mmol/L — ABNORMAL LOW (ref 98–111)
Creatinine, Ser: 0.76 mg/dL (ref 0.61–1.24)
GFR, Estimated: >60 mL/min (ref >60)
Glucose, Bld: 119 mg/dL — ABNORMAL HIGH (ref 70–99)
Potassium: 3.9 mmol/L (ref 3.5–5.1)
Sodium: 126 mmol/L — ABNORMAL LOW (ref 135–145)

## 2020-08-19 LAB — CBC
HCT: 41.7 % (ref 39.0–52.0)
Hemoglobin: 14.8 g/dL (ref 13.0–17.0)
MCH: 30.4 pg (ref 26.0–34.0)
MCHC: 35.5 g/dL (ref 30.0–36.0)
MCV: 85.6 fL (ref 80.0–100.0)
Platelets: 235 10*3/uL (ref 150–400)
RBC: 4.87 MIL/uL (ref 4.22–5.81)
RDW: 12.2 % (ref 11.5–15.5)
WBC: 13.5 10*3/uL — ABNORMAL HIGH (ref 4.0–10.5)
nRBC: 0 % (ref 0.0–0.2)

## 2020-08-19 NOTE — Progress Notes (Signed)
Physical Therapy Session Note  Patient Details  Name: Keith Downs MRN: [id number] Date of Birth: 08-Mar-1961  Today's Date: 08/19/2020 PT Individual Time: 1345-1410 PT Individual Time Calculation (min): 25 min   Short Term Goals: Week 1:  PT Short Term Goal 1 (Week 1): Pt will compete bed mobility with minA and no bed features PT Short Term Goal 2 (Week 1): Pt will complete bed<>chair transfers with minA and LRAD PT Short Term Goal 3 (Week 1): Pt will ambulate 36ft with modA and LRAD  Skilled Therapeutic Interventions/Progress Updates:    Handoff of care from OT. Patient received sitting upright in w/c and agreeable to therapy. No reports of pain but endorses fatigue from busy day of therapies and restless night. Pt propelled himself with supervision in w/c from his room to main therapy gym.  Gait training 63ft + 60ft + 78ft (seated rests) with minA and RW. Applied toe cap shoe cover to L foot and patient had his AFO on throughout session. After ~10-73ft of gait, he has increased difficulty clearing his L foot and shows compensatory strategies of hip hiking and circumduction. Mild forward flexed trunk and BOS is narrow. Cues throughout of sequencing, normalizing gait, and safety.  Seated ball toss using large green ball, bias towards his L side to incorporate LUE gross motor control and coordination as well as dynamic balance. Attempted to use a small "stress ball" size for this activity but he was unable to catch with LUE. Continued FMC for LUE in seated with paper towel into/out of cup. Also perfomed peg borad in standing with minA guard and RUE to RW for support, using LUE for manipulating peg's focusing on [facility] and balance. Balance impairements worsen with distraction but can correct with cues.  Pt propelled himself with distant supervision in his w/c back to his room, ~157ft. He ambulated 35ft back to his recliner with minA and RW and he remained seated in recliner with needs in  reach. RN present for scheduled medications.  Therapy Documentation Precautions:  Precautions Precautions: Cervical,Fall Required Braces or Orthoses: Cervical Brace Cervical Brace: Hard collar Restrictions Weight Bearing Restrictions: No Other Position/Activity Restrictions: Hard collar when OOB, may apply sitting EOB   Therapy/Group: Individual Therapy  Christian P Manhard PT 08/19/2020, 2:11 PM

## 2020-08-19 NOTE — Progress Notes (Signed)
Physical Therapy Session Note  Patient Details  Name: Keith Downs MRN: [id number] Date of Birth: [birth year]  Today's Date: 08/19/2020 PT Individual Time: 1500-1530 PT Individual Time Calculation (min): 30 min   Short Term Goals: Week 1:  PT Short Term Goal 1 (Week 1): Pt will compete bed mobility with minA and no bed features PT Short Term Goal 2 (Week 1): Pt will complete bed<>chair transfers with minA and LRAD PT Short Term Goal 3 (Week 1): Pt will ambulate 51ft with modA and LRAD  Skilled Therapeutic Interventions/Progress Updates:     Patient in recliner with cervical collar donned in the room upon PT arrival. Patient alert and agreeable to PT session. Patient reported 3-4/10 neck pain during session, RN made aware. PT provided repositioning, rest breaks, and distraction as pain interventions throughout session. Patient also reporting increased fatigue from poor sleep and stated that this was his fifth session today. Discussed barriers to sleep, patient reported increased pain at night. Educated on use of pain medication and muscle relaxer to improve sleep quality. Patient reported that he did not take these medications the previous night. Patient in agreement to try this tonight for improved sleep quality and energy levels with therapies.   Therapeutic Activity: Educated patient on pathology, symptoms, present deficits and recovery for spinal cord compression injury post surgical intervention during rest breaks between NMR activities.  Neuromuscular Re-ed: Patient performed the following lower extremity motor control and balance activities: -sit to/from stand with RW x5 with blocked practice for improved forward weight shift and use of lower extremities to improve boosting up; progressed from min A-CGA -mini-squats focused on L quad and gluteal activation with B upper extremity support for balance 3x5, followed cues and demonstration for technique, provided tactile feedback for  quad and glute activation throughout -standing balance 30-60 sec with single upper extremity support x4, focused on erect posture, sustained gluteal and quad activation, and reduced dependence on upper extremity support  Patient in recliner at end of session with breaks locked, chair alarm set, and all needs within reach. Patient asking to remove cervical collar while seated in the chair. Confirmed with Pam, PA that patient is cleared to sit without the collar and assisted patient with removing the collar at end of session.    Therapy Documentation Precautions:  Precautions Precautions: Cervical,Fall Required Braces or Orthoses: Cervical Brace Cervical Brace: Hard collar Restrictions Weight Bearing Restrictions: No Other Position/Activity Restrictions: Hard collar when OOB, may apply sitting EOB   Therapy/Group: Individual Therapy  Trai Ells L Uriel Dowding PT, DPT  08/19/2020, 4:52 PM

## 2020-08-19 NOTE — Progress Notes (Signed)
Restless night. Offered trazodone at HS, but declined. ? Schedule sleep aid. PRN tylenol given at 0347 and prn ultram given at 0629. Keith Downs

## 2020-08-19 NOTE — Progress Notes (Signed)
Occupational Therapy Session Note  Patient Details  Name: Keith Downs MRN: [id number] Date of Birth: 01-18-1961  Today's Date: 08/19/2020 OT Individual Time: 1000-1055 OT Individual Time Calculation (min): 55 min    Short Term Goals: Week 1:  OT Short Term Goal 1 (Week 1): Pt will demonstrate improved balance to be able to stand with RW and release 1 hand to pull pants up with min A. OT Short Term Goal 2 (Week 1): Pt will be able to complete squat pivot to toilet with mod A of 1. OT Short Term Goal 3 (Week 1): Pt will be able to don shirt over head with set up. OT Short Term Goal 4 (Week 1): Pt will be able to don pants over feet with min A with AE as needed.  Skilled Therapeutic Interventions/Progress Updates:    Pt resting in recliner upon arrival. Pt already dressed and ready for therapy. Pt propelled w/c to tub room and practiced TTB transfers. Educated pt on bathroom safety. Pt practiced bed tranfsers/bed mobility and furniture transfers to recliner in ADL apt. Sit<>stand with CGA.  Functional amb with RW at min A/CGA. Standing balance with min A. Pt returned to room and amb with RW from foot of bed to recliner with min A/CGA. Pt remained in recliner with all needs within reach.  Therapy Documentation Precautions:  Precautions Precautions: Cervical,Fall Required Braces or Orthoses: Cervical Brace Cervical Brace: Hard collar Restrictions Weight Bearing Restrictions: No Other Position/Activity Restrictions: Hard collar when OOB, may apply sitting EOB   Pain: Pain Assessment Pain Scale: 0-10 Pain Score: 0-No pain  Therapy/Group: Individual Therapy  Rich Brave 08/19/2020, 10:59 AM

## 2020-08-19 NOTE — Progress Notes (Addendum)
[facility] PHYSICAL MEDICINE & REHABILITATION PROGRESS NOTE   Subjective/Complaints:  Per RN pt didn't sleep much. Very restless. Pt declined trazodone. C/o tightness along right lower neck  ROS: Patient denies fever, rash, sore throat, blurred vision, nausea, vomiting, diarrhea, cough, shortness of breath or chest pain,   headache, or mood change.     Objective:   No results found. Recent Labs    08/19/20 0523  WBC 13.5*  HGB 14.8  HCT 41.7  PLT 235   Recent Labs    08/19/20 0523  NA 126*  K 3.9  CL 92*  CO2 25  GLUCOSE 119*  BUN 22*  CREATININE 0.76  CALCIUM 8.9    Intake/Output Summary (Last 24 hours) at 08/19/2020 1226 Last data filed at 08/19/2020 0800 Gross per 24 hour  Intake 1080 ml  Output 1475 ml  Net -395 ml        Physical Exam: Vital Signs Blood pressure 120/77, pulse 67, temperature 98 F (36.7 C), temperature source Oral, resp. rate 18, height 5\' 11"  (1.803 m), SpO2 100 %.  Constitutional: No distress . Vital signs reviewed. HEENT: EOMI, oral membranes moist Neck: cervical collar Cardiovascular: RRR without murmur. No JVD    Respiratory/Chest: CTA Bilaterally without wheezes or rales. Normal effort    GI/Abdomen: BS +, non-tender, non-distended Ext: no clubbing, cyanosis, or edema Psych: pleasant and cooperative Musculoskeletal:        General: Swelling (1+ pedal edema bilaterally) present.     Comments: Left lower extremity tenderness     Tightness, pain along mid to upper right trap with palpation Skin:    Comments: Neck incision CDI with steri strips in place.   Neurological:  LLE MAS of 3-     Mental Status: He is alert- but can be tangential     Comments: Alert and oriented  Motor: RUE: 5/5 proximal distal LUE: 4 -/5 proximal to distal RLE: 4 --4/5 proximal distal LLE: 3+-4 -/5 hip flexion, knee extension, 2/5 ankle dorsiflexion Sensation diminished light touch LUE/LLE--- no change       Assessment/Plan: 1. Functional  deficits which require 3+ hours per day of interdisciplinary therapy in a comprehensive inpatient rehab setting.  Physiatrist is providing close team supervision and 24 hour management of active medical problems listed below.  Physiatrist and rehab team continue to assess barriers to discharge/monitor patient progress toward functional and medical goals  Care Tool:  Bathing    Body parts bathed by patient: Left arm,Chest,Abdomen,Front perineal area,Right upper leg,Left upper leg,Face,Right arm,Right lower leg   Body parts bathed by helper: Left lower leg,Buttocks     Bathing assist Assist Level: Minimal Assistance - Patient > 75%     Upper Body Dressing/Undressing Upper body dressing   What is the patient wearing?: Pull over shirt    Upper body assist Assist Level: Supervision/Verbal cueing    Lower Body Dressing/Undressing Lower body dressing      What is the patient wearing?: Pants     Lower body assist Assist for lower body dressing: Moderate Assistance - Patient 50 - 74%     Toileting Toileting    Toileting assist Assist for toileting: Moderate Assistance - Patient 50 - 74%     Transfers Chair/bed transfer  Transfers assist     Chair/bed transfer assist level: Maximal Assistance - Patient 25 - 49%     Locomotion Ambulation   Ambulation assist      Assist level: Minimal Assistance - Patient > 75% Assistive device:  Walker-rolling Max distance: 35 ft   Walk 10 feet activity   Assist     Assist level: Minimal Assistance - Patient > 75% Assistive device: Walker-rolling   Walk 50 feet activity   Assist Walk 50 feet with 2 turns activity did not occur: Safety/medical concerns         Walk 150 feet activity   Assist Walk 150 feet activity did not occur: Safety/medical concerns         Walk 10 feet on uneven surface  activity   Assist Walk 10 feet on uneven surfaces activity did not occur: Safety/medical concerns          Wheelchair     Assist Will patient use wheelchair at discharge?: No Type of Wheelchair: Manual    Wheelchair assist level: Supervision/Verbal cueing Max wheelchair distance: 120'    Wheelchair 50 feet with 2 turns activity    Assist        Assist Level: Supervision/Verbal cueing   Wheelchair 150 feet activity     Assist          Blood pressure 120/77, pulse 67, temperature 98 F (36.7 C), temperature source Oral, resp. rate 18, height 5\' 11"  (1.803 m), SpO2 100 %.  Medical Problem List and Plan: 1.  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             -patient may not shower             -ELOS/Goals: 14-18 days/supervision/min a  1/22- MAY shower- just cover incision  1/24: plan to wean decadron?              -Continue CIR therapies including PT, OT  Improved 2.  Antithrombotics: -DVT/anticoagulation:  Mechanical: Sequential compression devices, below knee Bilateral lower extremities  1/22- started lovenox             -antiplatelet therapy: N/a 3. Pain Management: On oxycodone prn.    1/20- pain controlled- spasticity is not- as below  1/23- pain is controlled- spasticity main issue             Monitor with increased exertion, particularly for neuropathic pain 4. Mood: LCSW to follow for evaluation and support.              -antipsychotic agents: N/A 5. Neuropsych: This patient is capable of making decisions on his own behalf. 6. Skin/Wound Care: Routine pressure relief measures 7. Fluids/Electrolytes/Nutrition: Monitor I/Os             1/24. Hyponatremia of 126 (136 08/15/20), normal BUN/Cr/K+   -hold HCTZ (already received today)   -recheck bmet tomorrow 8. HTN: Monitor BP tid--continue HCTZ and Avapro. Monitor for orthostasis.   1/20- BP 127/84- doing well -con't regimen  1/21- BP 100s/70s- pulse 48- will decrease Avapro to 75 mg daily.  1/22- BP 118/72-  con't regimen   1/23- BP 108/60- and pulse 51- denies orthostasis- but does have bradycardia- con't to monitor             1/24 BP more robust today. 9. GERD: Resume PPI.   1/22- increased to BID per pt request from Sx's.  10. Hypothyroid: Continue supplement 11. COPD: Encourage pulmonary hygiene with IS and monitor respiratory status with increase in activity. Continue Dulera with albuterol prn SOB. .  12. Spasticity: Baclofen tid-->may need titration upwards to help manage symptoms.   1/20- will increase Baclofen to 10 mg QID  and then add prn Zanaflex 4 mg TID prn for increasing spasms, esp at night- explained can cause sedation/constipaiton. Of note, spasticity worse at night.   1/21- forgot to take zanaflex- will write to offer it- slept better with less cramping- will increase Baclofen Sat/Sunday  1/22- forgetting Zanaflex- will scheduled 4 mg QHS as well as baclofen  1/23-24- spasticity improving- once discharged, can possibly arrange for Botox toxin after d/c. Titrate tizanidine further as needed 13. Leukocytosis: likely steroids  1/20- WBC 13.4- but on decadron- will check U/A and Cx to make sure doesn't have UTI.    1/21- U/A (-)- is from decadron  1/22- questionable dysuria last night- pt denied?  1/23- pt denies dysuria today and hematuria- but sounds like he had yesterday base don his language now.  14. Frequent urination  1/21- hx of prostate enlargement- will start Flomax -.4 mg q supper- since sounds like not emptying.  1/23- peeing ~350cc at a time- so less often- con't flomax     LOS: 5 days A FACE TO FACE EVALUATION WAS PERFORMED  Ranelle Oyster 08/19/2020, 12:26 PM

## 2020-08-19 NOTE — Progress Notes (Signed)
Physical Therapy Session Note  Patient Details  Name: Keith Downs MRN: [id number] Date of Birth: 02-07-1961  Today's Date: 08/19/2020 PT Individual Time: 8101-7510 PT Individual Time Calculation (min): 55 min   Short Term Goals: Week 1:  PT Short Term Goal 1 (Week 1): Pt will compete bed mobility with minA and no bed features PT Short Term Goal 2 (Week 1): Pt will complete bed<>chair transfers with minA and LRAD PT Short Term Goal 3 (Week 1): Pt will ambulate 50ft with modA and LRAD  Skilled Therapeutic Interventions/Progress Updates:     Pt received seated in recliner and agrees to therapy. Reports some pain in neck but number not provided. PT reports taking pain meds prior and helping to alleviate pain. PT provides rest breaks as needed to manage pain. PT dons pt's TED hose prior to mobility. Stand step transfer to the [facility] with modA due to LOB while turning. Pt self propels WC to gym, x150', with supervision and cues for positioning. Stand pivot transfer to mat table with minA. Pt performs x5 reps sit to stand with RW and CGA for strengthening, motor planning, and transfer training. Pt then performs marching in place x1 minute with CGA with focus on L knee flexion and foot clearance. Pt ambulates x25' with minA and RW, with cues for upright gaze to improve posture and balance. PT cues for sequencing while transferring to mat for increased safety, with cues for increased eccentric control. With 1.75" step, pt perform toe taps 3x5 with L lower extremity and 1x10 with R lower extremity. PT cues for increased knee flexion during activity. Pt performs seated knee flexion/extension on L side with washcloth placed under L foot to decrease friction, 5x5. Pt perrfroms x5 additional toe taps with L leg. Stand pivot transfer to [facility] with minA. WC transport back to room. Pt performs stand step transfer to recliner with RW and minA. Left seated with alarm intact and all needs within reach.  Therapy  Documentation Precautions:  Precautions Precautions: Cervical,Fall Required Braces or Orthoses: Cervical Brace Cervical Brace: Hard collar Restrictions Weight Bearing Restrictions: No Other Position/Activity Restrictions: Hard collar when OOB, may apply sitting EOB    Therapy/Group: Individual Therapy  Beau Fanny, PT, DPT 08/19/2020, 4:17 PM

## 2020-08-19 NOTE — Progress Notes (Signed)
Occupational Therapy Session Note  Patient Details  Name: Keith Downs MRN: [id number] Date of Birth: [birth year]  Today's Date: 08/19/2020 OT Individual Time: 1300-1345 OT Individual Time Calculation (min): 45 min    Short Term Goals: Week 1:  OT Short Term Goal 1 (Week 1): Pt will demonstrate improved balance to be able to stand with RW and release 1 hand to pull pants up with min A. OT Short Term Goal 2 (Week 1): Pt will be able to complete squat pivot to toilet with mod A of 1. OT Short Term Goal 3 (Week 1): Pt will be able to don shirt over head with set up. OT Short Term Goal 4 (Week 1): Pt will be able to don pants over feet with min A with AE as needed.  Skilled Therapeutic Interventions/Progress Updates:    Pt resting in recliner upon arrival and agreeable to therapy. OT intervention with focus on w/c mobility, sit<>stand, standing balance, stand step transfers with RW, LUE function, and safety awareness to increase independence with bADLs. Sit>stand from recliner and amb with RW to w/c (5') with CGA. Pt initially engaged in LUE strengthening and [facility] tasks with red theraputty and small beads-manipulating putty and retrieving/replacing beads in putty.Pt also engaged in standing tasks with PVC pipe tree. Pt required amx multimodal cues for upright posture and extending B/L knees when standing. Pt donned gloves and cleaned all piping with saniwipes. Pt returned to room and handed off to PT.   Therapy Documentation Precautions:  Precautions Precautions: Cervical,Fall Required Braces or Orthoses: Cervical Brace Cervical Brace: Hard collar Restrictions Weight Bearing Restrictions: No Other Position/Activity Restrictions: Hard collar when OOB, may apply sitting EOB Pain:  Pt denies pain this afternoon   Therapy/Group: Individual Therapy  Rich Brave 08/19/2020, 2:45 PM

## 2020-08-20 ENCOUNTER — Inpatient Hospital Stay (HOSPITAL_COMMUNITY): Payer: BC Managed Care – PPO

## 2020-08-20 ENCOUNTER — Inpatient Hospital Stay (HOSPITAL_COMMUNITY): Payer: BC Managed Care – PPO | Admitting: Physical Therapy

## 2020-08-20 LAB — BASIC METABOLIC PANEL
Anion gap: 11 (ref 5–15)
BUN: 26 mg/dL — ABNORMAL HIGH (ref 6–20)
CO2: 26 mmol/L (ref 22–32)
Calcium: 9.1 mg/dL (ref 8.9–10.3)
Chloride: 87 mmol/L — ABNORMAL LOW (ref 98–111)
Creatinine, Ser: 0.94 mg/dL (ref 0.61–1.24)
GFR, Estimated: >60 mL/min (ref >60)
Glucose, Bld: 120 mg/dL — ABNORMAL HIGH (ref 70–99)
Potassium: 4.1 mmol/L (ref 3.5–5.1)
Sodium: 124 mmol/L — ABNORMAL LOW (ref 135–145)

## 2020-08-20 MED ORDER — DOCUSATE SODIUM 100 MG PO CAPS
100.0000 mg | ORAL_CAPSULE | Freq: Every day | ORAL | Status: DC
  Administered 2020-08-20 – 2020-08-27 (×8): 100 mg via ORAL
  Filled 2020-08-20 (×10): qty 1

## 2020-08-20 MED ORDER — SODIUM CHLORIDE 0.9 % IV SOLN: INTRAVENOUS | Status: AC

## 2020-08-20 MED ORDER — DEXAMETHASONE 2 MG PO TABS
2.0000 mg | ORAL_TABLET | Freq: Two times a day (BID) | ORAL | Status: AC
  Administered 2020-08-20 – 2020-08-23 (×6): 2 mg via ORAL
  Filled 2020-08-20 (×6): qty 1

## 2020-08-20 MED ORDER — FINASTERIDE 5 MG PO TABS
5.0000 mg | ORAL_TABLET | Freq: Every day | ORAL | Status: DC
  Administered 2020-08-20 – 2020-08-29 (×10): 5 mg via ORAL
  Filled 2020-08-20 (×11): qty 1

## 2020-08-20 MED ORDER — DEXAMETHASONE 2 MG PO TABS
2.0000 mg | ORAL_TABLET | Freq: Every day | ORAL | Status: AC
  Administered 2020-08-23 – 2020-08-25 (×3): 2 mg via ORAL
  Filled 2020-08-20 (×3): qty 1

## 2020-08-20 NOTE — Progress Notes (Addendum)
[place] PHYSICAL MEDICINE & REHABILITATION PROGRESS NOTE   Subjective/Complaints:   Pt reports slept pretty well- took trazodone for sleep.   Emptying bladder better- PVRs ~ 100cc-  Said they plan to extend his stay- therapy- Walking no more than 70 ft RW at a time.   LBM yesterday a little hard.  Will add Colace per pt request.   Na down to 124 from 126- which was down from 130 BUN also up to 26.   ROS:  Pt denies SOB, abd pain, CP, N/V/C/D, and vision changes   Objective:   No results found. Recent Labs    08/19/20 0523  WBC 13.5*  HGB 14.8  HCT 41.7  PLT 235   Recent Labs    08/19/20 0523 08/20/20 0607  NA 126* 124*  K 3.9 4.1  CL 92* 87*  CO2 25 26  GLUCOSE 119* 120*  BUN 22* 26*  CREATININE 0.76 0.94  CALCIUM 8.9 9.1    Intake/Output Summary (Last 24 hours) at 08/20/2020 1019 Last data filed at 08/20/2020 0803 Gross per 24 hour  Intake 1077 ml  Output 1560 ml  Net -483 ml        Physical Exam: Vital Signs Blood pressure 111/73, pulse 65, temperature 99.5 F (37.5 C), resp. rate 18, height 5\' 11"  (1.803 m), SpO2 100 %.  Constitutional: No distress . Vital signs reviewed. Sitting up in bedside chair, appropriate,wearing collar, NAD HEENT: EOMI, oral membranes moist Neck: cervical collar in place- incision anterior- with steristrips Cardiovascular: RRR   Respiratory/Chest: CTA B/L- no W/R/R- good air movement GI/Abdomen: Soft, NT, ND, (+)BS  Ext: no clubbing, cyanosis, or edema Psych: pleasant, appropriate Musculoskeletal:        General: Swelling (1+ pedal edema bilaterally) present.     Comments: Left lower extremity tenderness     Tightness, pain along mid to upper right trap with palpation Skin:    Comments: Neck incision CDI with steri strips in place.   Neurological:  LLE MAS of 3-     Mental Status: He is alert- but can be tangential     Comments: Alert and oriente Motor: RUE: 5/5 proximal distal LUE: 4 -/5 proximal to  distal RLE: 4 --4/5 proximal distal LLE: 3+-4 -/5 hip flexion, knee extension, 2/5 ankle dorsiflexion Sensation diminished light touch LUE/LLE--- no change       Assessment/Plan: 1. Functional deficits which require 3+ hours per day of interdisciplinary therapy in a comprehensive inpatient rehab setting.  Physiatrist is providing close team supervision and 24 hour management of active medical problems listed below.  Physiatrist and rehab team continue to assess barriers to discharge/monitor patient progress toward functional and medical goals  Care Tool:  Bathing    Body parts bathed by patient: Right arm,Left arm,Chest,Abdomen,Front perineal area,Right upper leg,Left upper leg,Right lower leg,Left lower leg,Face   Body parts bathed by helper: Left lower leg,Buttocks     Bathing assist Assist Level: Minimal Assistance - Patient > 75%     Upper Body Dressing/Undressing Upper body dressing   What is the patient wearing?: Pull over shirt,Orthosis    Upper body assist Assist Level: Minimal Assistance - Patient > 75%    Lower Body Dressing/Undressing Lower body dressing      What is the patient wearing?: Pants     Lower body assist Assist for lower body dressing: Minimal Assistance - Patient > 75%     Toileting Toileting    Toileting assist Assist for toileting: Moderate Assistance -  Patient 50 - 74%     Transfers Chair/bed transfer  Transfers assist     Chair/bed transfer assist level: Minimal Assistance - Patient > 75%     Locomotion Ambulation   Ambulation assist      Assist level: Minimal Assistance - Patient > 75% Assistive device: Walker-rolling Max distance: 25'   Walk 10 feet activity   Assist     Assist level: Minimal Assistance - Patient > 75% Assistive device: Walker-rolling   Walk 50 feet activity   Assist Walk 50 feet with 2 turns activity did not occur: Safety/medical concerns         Walk 150 feet activity   Assist  Walk 150 feet activity did not occur: Safety/medical concerns         Walk 10 feet on uneven surface  activity   Assist Walk 10 feet on uneven surfaces activity did not occur: Safety/medical concerns         Wheelchair     Assist Will patient use wheelchair at discharge?: No Type of Wheelchair: Manual    Wheelchair assist level: Supervision/Verbal cueing Max wheelchair distance: 150    Wheelchair 50 feet with 2 turns activity    Assist        Assist Level: Supervision/Verbal cueing   Wheelchair 150 feet activity     Assist      Assist Level: Supervision/Verbal cueing   Blood pressure 111/73, pulse 65, temperature 99.5 F (37.5 C), resp. rate 18, height 5\' 11"  (1.803 m), SpO2 100 %.  Medical Problem List and Plan: 1.  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             -ELOS/Goals: 14-18 days/supervision/min a  1/22- MAY shower- just cover incision  1/24: plan to wean decadron?  1/25- decrease decadron to 2 mg BID x 3 days then QHS x3 days then stop              -Continue CIR therapies including PT, OT  Improved 2.  Antithrombotics: -DVT/anticoagulation:  Mechanical: Sequential compression devices, below knee Bilateral lower extremities  1/22- started lovenox             -antiplatelet therapy: N/a 3. Pain Management: On oxycodone prn.    1/20- pain controlled- spasticity is not- as below  1/23- pain is controlled- spasticity main issue  1/25- pain controlled- con't regimen             Monitor with increased exertion, particularly for neuropathic pain 4. Mood: LCSW to follow for evaluation and support.              -antipsychotic agents: N/A 5. Neuropsych: This patient is capable of making decisions on his own behalf. 6. Skin/Wound Care: Routine pressure relief measures 7. Fluids/Electrolytes/Nutrition: Monitor I/Os             1/24. Hyponatremia of 126  (136 08/15/20), normal BUN/Cr/K+   -hold HCTZ (already received today)   -recheck bmet tomorrow  1/25- Na down to 124 today- off HCTZ- since BUN also up to 26- will give NS IVFs 75cc hr x 1 days and BP also somewhat low- could be worse due to steroids which I'm tapering.  8. HTN: Monitor BP tid--continue HCTZ and Avapro. Monitor for orthostasis.   1/20- BP 127/84- doing well -con't regimen  1/25- BP 111/60s- will do IVFs as above.  9. GERD: Resume PPI.   1/22- increased to BID  per pt request from Sx's.  10. Hypothyroid: Continue supplement 11. COPD: Encourage pulmonary hygiene with IS and monitor respiratory status with increase in activity. Continue Dulera with albuterol prn SOB. .  12. Spasticity: Baclofen tid-->may need titration upwards to help manage symptoms.   1/20- will increase Baclofen to 10 mg QID and then add prn Zanaflex 4 mg TID prn for increasing spasms, esp at night- explained can cause sedation/constipaiton. Of note, spasticity worse at night.   1/21- forgot to take zanaflex- will write to offer it- slept better with less cramping- will increase Baclofen Sat/Sunday  1/22- forgetting Zanaflex- will scheduled 4 mg QHS as well as baclofen  1/23-24- spasticity improving- once discharged, can possibly arrange for Botox toxin after d/c. Titrate tizanidine further as needed 13. Leukocytosis: likely steroids  1/20- WBC 13.4- but on decadron- will check U/A and Cx to make sure doesn't have UTI.    1/21- U/A (-)- is from decadron  1/22- questionable dysuria last night- pt denied?  1/23- pt denies dysuria today and hematuria- but sounds like he had yesterday base don his language now.  14. Frequent urination  1/21- hx of prostate enlargement- will start Flomax -.4 mg q supper- since sounds like not emptying.  1/23- peeing ~350cc at a time- so less often- con't flomax 15. Hematuria  1/25- documented in chart x 2- U/A on 1/20 was (-) for hematuria/blood in urine- will call Urology to see  what the next step would be.  16. Hyponatremia  1/25- down to 124- will give IVFs NS 75cc/hr - could have been due to HCTZ? Which has stopped or Decadron, which I'm tapering- will monitor.     I spent a total of 35 minutes on total care today- seeing pt twice to explain plan and consulting Urology and note- >50% of coordination or care. Talked to Dr Alvester Morin- suggest outpt workup and Finesteride for probable prostate bleeding/swelling. Started 5 mg QHS   LOS: 6 days A FACE TO FACE EVALUATION WAS PERFORMED  Mayo Faulk 08/20/2020, 10:19 AM

## 2020-08-20 NOTE — Progress Notes (Signed)
Physical Therapy Session Note  Patient Details  Name: Keith Downs MRN: [id number] Date of Birth: 16-Apr-1961  Today's Date: 08/20/2020 PT Individual Time:  0905-1000 PT Minutes: 55 min  Short Term Goals: Week 1:  PT Short Term Goal 1 (Week 1): Pt will compete bed mobility with minA and no bed features PT Short Term Goal 2 (Week 1): Pt will complete bed<>chair transfers with minA and LRAD PT Short Term Goal 3 (Week 1): Pt will ambulate 2ft with modA and LRAD  Skilled Therapeutic Interventions/Progress Updates:    Pt seated in recliner on arrival. STS transfer min A progressed to CGA. Pt ambulated with RW, L AFO, shoe cover as toe cap, and CGA 3 bouts of 12 ft with increasing fatigue of LLE. Demonstrates poor foot clearance, poor balance, decreased step length, and decreased hip/knee flexion. Discussed permanent toe cap with pt and will contact orthotist. Pt performed 3" steps x 8 with 2 handrails with min A for balance and safety while ascending, mod A for advancing LLE off step while descending. Pt limited by difficulty lifting LLE. Pt performed L standing march with assist to encourage knee/hip flexion 2 x 10. Required CGA and RW for balance. Performed 10 elevated lunges using 3" stair with both handrails and CGA to prepare for step up with LLE. LLE step up on 3" step, 2 x 10,  Required CGA for balance and multimodal cues to straighten knee. Pt attempts ambulation x 27ft, but requires mod A to advance LLE due to fatigue. Pt propels wc to room with supervision x 300 ft and uses stand step transfer to return to recliner. Pt left seated in recliner with chair alarm active and all needs in reach.   Therapy Documentation Precautions:  Precautions Precautions: Cervical,Fall Required Braces or Orthoses: Cervical Brace Cervical Brace: Hard collar Restrictions Weight Bearing Restrictions: No Other Position/Activity Restrictions: Hard collar when OOB, may apply sitting EOB       Therapy/Group: Individual Therapy  Dyane Dustman, SPT 08/20/2020, 7:51 AM

## 2020-08-20 NOTE — Progress Notes (Signed)
+/-   sleep. Urinary frequency & urgency. Complained once about burning with urination and hematuria noted x1. PVR this AM=107. Attempts to sit on EOB, too wobbly to maintain balance and manage urinal.   Incontinent before staff could assist with urinal. spoke with patient about using urinal in the bed, to give him more time to place urinal. Decreased dexterity in hands, Left > right. PRN trazodone given at 2112, reports helping him sleep. At 0304, PRN ultram given. PRN tylenol given at 0541. Anterior neck incision with steris. Alfredo Martinez A

## 2020-08-20 NOTE — Progress Notes (Signed)
Occupational Therapy Session Note  Patient Details  Name: Keith Downs MRN: [id number] Date of Birth: 09-12-1960  Today's Date: 08/20/2020 OT Individual Time: 0700-0810 OT Individual Time Calculation (min): 70 min    Short Term Goals: Week 1:  OT Short Term Goal 1 (Week 1): Pt will demonstrate improved balance to be able to stand with RW and release 1 hand to pull pants up with min A. OT Short Term Goal 2 (Week 1): Pt will be able to complete squat pivot to toilet with mod A of 1. OT Short Term Goal 3 (Week 1): Pt will be able to don shirt over head with set up. OT Short Term Goal 4 (Week 1): Pt will be able to don pants over feet with min A with AE as needed.  Skilled Therapeutic Interventions/Progress Updates:    Pt resting in recliner upon arrival with RN present. OT intervention with focus on functional transfers, sit<>stand, bathing at shower level, dressing with sit<>stand from recliner, and BLE therex to increase independence with BADLs. Sit<>stand with CGA. Stand pivot transfer with CGA. Transfer to TTB with CGA. Pt required assistance bathing buttocks during standing with grab bars. Pt able to thread BLE into pants without assistance. Sit<>stand with CGA and min A for standing while pt pulled pants over hips. Pt able to don shoe on Rt foot but required assistance donning Lt AFO and shoe. Pt propelled to day room and engaged in BLE/BUE therex on NuStep-5 minsX 2 level 3. Pt propelled w/c back to room and amb with RW ~5' to recliner. Pt remained in recliner with all needs within reach and seat alarm activated.  Therapy Documentation Precautions:  Precautions Precautions: Cervical,Fall Required Braces or Orthoses: Cervical Brace Cervical Brace: Hard collar Restrictions Weight Bearing Restrictions: No Other Position/Activity Restrictions: Hard collar when OOB, may apply sitting EOB Pain: Pt denies pain this morning  Therapy/Group: Individual Therapy  Rich Brave 08/20/2020, 8:14 AM

## 2020-08-20 NOTE — Patient Care Conference (Signed)
Inpatient RehabilitationTeam Conference and Plan of Care Update Date: 08/20/2020   Time: 11:14 AM    Patient Name: Keith Downs      Medical Record Number: [id number]  Date of Birth: 1961/06/24 Sex: Male         Room/Bed: 4W12C/4W12C-02 Payor Info: Payor: BLUE CR[employer] BLUE [employer]IELD / Plan: BCBS COMM PPO / Product Type: *No Product type* /    Admit Date/Time:  08/14/2020  3:24 PM  Primary Diagnosis:  Cervical myelopathy [facility])  Hospital Problems: Principal Problem:   Cervical myelopathy [facility])    Expected Discharge Date: Expected Discharge Date: 08/30/20  Team Members Present: Physician leading conference: Dr. Genice Rouge Care Coodinator Present: Cecile Sheerer, LCSWA;Hopie Pellegrin Marlyne Beards, RN, BSN, CRRN Nurse Present: Despina Hidden, RN PT Present: Peter Congo, PT OT Present: Roney Mans, OT;Ardis Rowan, COTA PPS Coordinator present : Edson Snowball, Park Breed, SLP     Current Status/Progress Goal Weekly Team Focus  Bowel/Bladder   continent of bowel/bladder; LBM 08/19/20  Remain continent of bowel/bladder while in rehab  assess and address bowel/bladder need q shift and as needed   Swallow/Nutrition/ Hydration             ADL's   bathing-min A; LB dressing-mod A; functional tranfsers-min A; toileting-mod A; standing balance-min A  supervision overall  BADL training, functional transfers, standing balance, safety awareness   Mobility   min A overall with RW, gait up to 50 ft with RW min A, Supervison w/c mobility  Supervision overall, CGA stairs  LLE NMR, gait training   Communication             Safety/Cognition/ Behavioral Observations  able to call for help  Continue to follow safety protocol with mod i  assess and address any safety issues q shift and as needed   Pain   no complain of pain  <2  assess and treate pain q shift and as needed   Skin   right           Discharge Planning:  Pt to have 24/7 care from pt wife.   Team Discussion: Added  Trazodone for sleep, Na+ 124, IVF ordered, BP okay, just a little low, added Zanaflex BID, Hematuria documented twice but clear urine for now. Nursing reported blood in urine on Friday. Today no blood in urine. Patient to discharge home with wife who is to provide 24/7 care. Schedule family education with wife. Patient on target to meet rehab goals: Contact guard to min assist 50 ft, left AFO needs toe cap. Min assist with stairs needs assist advancing. Needs verbal cues, gets to rushing and needs cues to place feet, ted's, AFO, and shoes. Fatigues quickly.    *See Care Plan and progress notes for long and short-term goals.   Revisions to Treatment Plan:  MD ordered U/A if hematuria again, colace for hard stools, tapering steroids.  Teaching Needs: Family education, medication management, transfer training, gait training, wound/skin care, balance training, fatigue management, endurance, weight bearing precautions.  Current Barriers to Discharge: Inaccessible home environment, Decreased caregiver support, Home enviroment access/layout, Neurogenic bowel and bladder, Wound care, Lack of/limited family support, Weight bearing restrictions, Medication compliance and Behavior  Possible Resolutions to Barriers: Continue current medications, education on bowel and bladder program, provide emotional support to patient and family.     Medical Summary Current Status: hematuria- called urology; BUN 26/Na 124- starting IVFs 75cc/hr.  Barriers to Discharge: Other (comments);Wound care;Weight bearing restrictions;Decreased family/caregiver support;Medical stability;Medication compliance;Neurogenic Bowel & Bladder;Home  enviroment access/layout  Barriers to Discharge Comments: d/c to home with wife 24/7 care; PVRs somewhat elevated, but better with flomax;  L AFO- will need toecap for L shoe; max distance 50 ft RW Possible Resolutions to [employer] Focus: U/A if has hematuria again; colace for hard stools;  IVFs for BUN 26/Na 124; tapering steroids; ADLs- verbal cues a lot-LB mod A; - 08/30/20- next Friday   Continued Need for Acute Rehabilitation Level of Care: The patient requires daily medical management by a physician with specialized training in physical medicine and rehabilitation for the following reasons: Direction of a multidisciplinary physical rehabilitation program to maximize functional independence : Yes Medical management of patient stability for increased activity during participation in an intensive rehabilitation regime.: Yes Analysis of laboratory values and/or radiology reports with any subsequent need for medication adjustment and/or medical intervention. : Yes   I attest that I was present, lead the team conference, and concur with the assessment and plan of the team.   Tennis Must 08/20/2020, 5:43 PM

## 2020-08-20 NOTE — Progress Notes (Signed)
Patient ID: Keith Downs, male   DOB: Oct 02, 1960, 60 y.o.   MRN: [id number]  SW met with pt in room to provide updates from team conference, and d/c date 2/4.  SW spoke with pt wife to provide above updates.   Loralee Pacas, MSW, [place] Office: [phone] Cell: [phone] Fax: [phone]

## 2020-08-20 NOTE — Progress Notes (Signed)
Occupational Therapy Session Note  Patient Details  Name: Keith Downs MRN: [id number] Date of Birth: 07/18/1961  Today's Date: 08/20/2020 OT Individual Time: 1300-1415 OT Individual Time Calculation (min): 75 min    Short Term Goals: Week 1:  OT Short Term Goal 1 (Week 1): Pt will demonstrate improved balance to be able to stand with RW and release 1 hand to pull pants up with min A. OT Short Term Goal 2 (Week 1): Pt will be able to complete squat pivot to toilet with mod A of 1. OT Short Term Goal 3 (Week 1): Pt will be able to don shirt over head with set up. OT Short Term Goal 4 (Week 1): Pt will be able to don pants over feet with min A with AE as needed.  Skilled Therapeutic Interventions/Progress Updates:    Pt resting in recliner upon arrival. OT intervention with focus on sit<>stand, standing balance, functional transfers, LUE function, LUE FMC/strengthening, safety awareness, and activity tolerance to increase independence with BADLs. BITS activities 5x1 min: sit<>stand with CGA, standing balance with min A/CGA with max verbal cues for knee extension and upright posture. Pt challenged to use LUE for all tasks. Pt commented that he could tell his arm was getting tired. Pt with minimal shoulder hike when reaching. No LOB noted. Pt transitioned to table tasks with clothes pins (red, blue, and green). Pt initially using compensatory grasp on clothes pins. Pt changed grasp to pincher grasp with thumb superior to fingers with improved function and decrease in compensatory shoulder technques. Pt also donned gloves to clean clothes pins with saniwipes. Pt returned to room and amb with RW 5' to recliner. Pt's wife present. All needs within reach and seat alarm activated.  Therapy Documentation Precautions:  Precautions Precautions: Cervical,Fall Required Braces or Orthoses: Cervical Brace Cervical Brace: Hard collar Restrictions Weight Bearing Restrictions: No Other  Position/Activity Restrictions: Hard collar when OOB, may apply sitting EOB   Pain:  Pt denies pain this afternoon   Therapy/Group: Individual Therapy  Rich Brave 08/20/2020, 2:28 PM

## 2020-08-21 ENCOUNTER — Inpatient Hospital Stay (HOSPITAL_COMMUNITY): Payer: BC Managed Care – PPO

## 2020-08-21 ENCOUNTER — Inpatient Hospital Stay (HOSPITAL_COMMUNITY): Payer: BC Managed Care – PPO | Admitting: Physical Therapy

## 2020-08-21 ENCOUNTER — Inpatient Hospital Stay (HOSPITAL_COMMUNITY): Payer: BC Managed Care – PPO | Admitting: Occupational Therapy

## 2020-08-21 NOTE — Progress Notes (Signed)
Dr. Yetta Barre and I stopped by to examine the patient. He was working with OT in the gym. He is doing really well and making great improvements. He is very motivated to get better and stronger.

## 2020-08-21 NOTE — Progress Notes (Signed)
[facility] PHYSICAL MEDICINE & REHABILITATION PROGRESS NOTE   Subjective/Complaints: Pt reports doing well- slept well- Denies any confusion from Na of 124.  Peeing very well drinking a LOT from dry mouth- encouraged pt to use gum, hard candy like peppermints  On IVFs currently.    Notes no more hematuria so far since yesterday.   ROS:   Pt denies SOB, abd pain, CP, N/V/C/D, and vision changes  Objective:   No results found. Recent Labs    08/19/20 0523  WBC 13.5*  HGB 14.8  HCT 41.7  PLT 235   Recent Labs    08/19/20 0523 08/20/20 0607  NA 126* 124*  K 3.9 4.1  CL 92* 87*  CO2 25 26  GLUCOSE 119* 120*  BUN 22* 26*  CREATININE 0.76 0.94  CALCIUM 8.9 9.1    Intake/Output Summary (Last 24 hours) at 08/21/2020 1001 Last data filed at 08/21/2020 0300 Gross per 24 hour  Intake 237 ml  Output 2020 ml  Net -1783 ml        Physical Exam: Vital Signs Blood pressure 135/79, pulse 67, temperature 98.5 F (36.9 C), resp. rate 19, height 5\' 11"  (1.803 m), SpO2 97 %.  Constitutional: sitting up in bedside chair, appropriate, NAD HEENT: EOMI, oral membranes moist Neck: cervical collar in place- incision anterior- with steristrips- no change Cardiovascular: RRR   Respiratory/Chest: CTA B/L- no W/R/R- good air movement GI/Abdomen: Soft, NT, ND, (+)BS  Ext: no clubbing, cyanosis, or edema Psych: pleasant, appropriate Musculoskeletal:        General: Swelling (1+ pedal edema bilaterally) present.     Comments: Left lower extremity tenderness     Tightness, pain along mid to upper right trap with palpation Skin:    Comments: Neck incision CDI with steri strips in place.   Neurological:  LLE MAS of 3-     Mental Status: He is alert- but can be tangential- doing better/same     Comments: Alert and oriente Motor: RUE: 5/5 proximal distal LUE: 4 -/5 proximal to distal RLE: 4 --4/5 proximal distal LLE: 3+-4 -/5 hip flexion, knee extension, 2/5 ankle  dorsiflexion Sensation diminished light touch LUE/LLE--- no change       Assessment/Plan: 1. Functional deficits which require 3+ hours per day of interdisciplinary therapy in a comprehensive inpatient rehab setting.  Physiatrist is providing close team supervision and 24 hour management of active medical problems listed below.  Physiatrist and rehab team continue to assess barriers to discharge/monitor patient progress toward functional and medical goals  Care Tool:  Bathing    Body parts bathed by patient: Right arm,Left arm,Chest,Abdomen,Front perineal area,Right upper leg,Left upper leg,Right lower leg,Left lower leg,Face   Body parts bathed by helper: Left lower leg,Buttocks     Bathing assist Assist Level: Minimal Assistance - Patient > 75%     Upper Body Dressing/Undressing Upper body dressing   What is the patient wearing?: Pull over shirt,Orthosis    Upper body assist Assist Level: Minimal Assistance - Patient > 75%    Lower Body Dressing/Undressing Lower body dressing      What is the patient wearing?: Pants     Lower body assist Assist for lower body dressing: Minimal Assistance - Patient > 75%     Toileting Toileting    Toileting assist Assist for toileting: Moderate Assistance - Patient 50 - 74%     Transfers Chair/bed transfer  Transfers assist     Chair/bed transfer assist level: Contact Guard/Touching assist  Locomotion Ambulation   Ambulation assist      Assist level: Minimal Assistance - Patient > 75% Assistive device: Walker-rolling Max distance: 15   Walk 10 feet activity   Assist     Assist level: Minimal Assistance - Patient > 75% Assistive device: Walker-rolling   Walk 50 feet activity   Assist Walk 50 feet with 2 turns activity did not occur: Safety/medical concerns         Walk 150 feet activity   Assist Walk 150 feet activity did not occur: Safety/medical concerns         Walk 10 feet on  uneven surface  activity   Assist Walk 10 feet on uneven surfaces activity did not occur: Safety/medical concerns         Wheelchair     Assist Will patient use wheelchair at discharge?: No Type of Wheelchair: Manual    Wheelchair assist level: Supervision/Verbal cueing Max wheelchair distance: 150    Wheelchair 50 feet with 2 turns activity    Assist        Assist Level: Supervision/Verbal cueing   Wheelchair 150 feet activity     Assist      Assist Level: Supervision/Verbal cueing   Blood pressure 135/79, pulse 67, temperature 98.5 F (36.9 C), resp. rate 19, height 5\' 11"  (1.803 m), SpO2 97 %.  Medical Problem List and Plan: 1.  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             -ELOS/Goals: 14-18 days/supervision/min a  1/22- MAY shower- just cover incision  1/24: plan to wean decadron?  1/25- decrease decadron to 2 mg BID x 3 days then QHS x3 days then stop              -Continue CIR therapies including PT, OT  Improved 2.  Antithrombotics: -DVT/anticoagulation:  Mechanical: Sequential compression devices, below knee Bilateral lower extremities  1/22- started lovenox             -antiplatelet therapy: N/a 3. Pain Management: On oxycodone prn.    1/26- pain controlled- spasticity a little better as below- con't regimen             Monitor with increased exertion, particularly for neuropathic pain 4. Mood: LCSW to follow for evaluation and support.              -antipsychotic agents: N/A 5. Neuropsych: This patient is capable of making decisions on his own behalf. 6. Skin/Wound Care: Routine pressure relief measures 7. Fluids/Electrolytes/Nutrition: Monitor I/Os             1/24. Hyponatremia of 126 (136 08/15/20), normal BUN/Cr/K+   -hold HCTZ (already received today)   -recheck bmet tomorrow  1/25- Na down to 124 today- off HCTZ- since BUN also up to  26- will give NS IVFs 75cc hr x 1 days and BP also somewhat low- could be worse due to steroids which I'm tapering.   1/26- pt also admits has been drinking a LOT due to dry mouth- suggested gum/ice/hard candy to help dry mouth since drinking too much 8. HTN: Monitor BP tid--continue HCTZ and Avapro. Monitor for orthostasis.   1/20- BP 127/84- doing well -con't regimen  1/25- BP 111/60s- will do IVFs as above.  9. GERD: Resume PPI.   1/22- increased to BID per pt request from Sx's.  10. Hypothyroid: Continue supplement 11. COPD: Encourage pulmonary hygiene with IS and  monitor respiratory status with increase in activity. Continue Dulera with albuterol prn SOB. .  12. Spasticity: Baclofen tid-->may need titration upwards to help manage symptoms.   1/20- will increase Baclofen to 10 mg QID and then add prn Zanaflex 4 mg TID prn for increasing spasms, esp at night- explained can cause sedation/constipaiton. Of note, spasticity worse at night.   1/21- forgot to take zanaflex- will write to offer it- slept better with less cramping- will increase Baclofen Sat/Sunday  1/22- forgetting Zanaflex- will scheduled 4 mg QHS as well as baclofen  1/23-24- spasticity improving- once discharged, can possibly arrange for Botox toxin after d/c. Titrate tizanidine further as needed 13. Leukocytosis: likely steroids  1/20- WBC 13.4- but on decadron- will check U/A and Cx to make sure doesn't have UTI.    1/21- U/A (-)- is from decadron  1/22- questionable dysuria last night- pt denied?  1/23- pt denies dysuria today and hematuria- but sounds like he had yesterday based on his language now.  1/26- weaning Steroids to help with leukocytosis.   14. Frequent urination  1/21- hx of prostate enlargement- will start Flomax -.4 mg q supper- since sounds like not emptying.  1/23- peeing ~350cc at a time- so less often- con't flomax 15. Hematuria  1/25- documented in chart x 2- U/A on 1/20 was (-) for hematuria/blood in  urine- will call Urology to see what the next step would be.  16. Hyponatremia  1/25- down to 124- will give IVFs NS 75cc/hr - could have been due to HCTZ? Which has stopped or Decadron, which I'm tapering- will monitor.   1/26- will check CBC and BMP in AM- suggest not drinking so much so can improve Na and as wean steroids, should also help.      LOS: 7 days A FACE TO FACE EVALUATION WAS PERFORMED  Keith Downs 08/21/2020, 10:01 AM

## 2020-08-21 NOTE — Progress Notes (Signed)
Physical Therapy Session Note  Patient Details  Name: Keith Downs MRN: [id number] Date of Birth: [birth year]  Today's Date: 08/21/2020 PT Individual Time: 1000-1100 PT Individual Time Calculation (min): 60 min   Short Term Goals: Week 1:  PT Short Term Goal 1 (Week 1): Pt will compete bed mobility with minA and no bed features PT Short Term Goal 2 (Week 1): Pt will complete bed<>chair transfers with minA and LRAD PT Short Term Goal 3 (Week 1): Pt will ambulate 2ft with modA and LRAD  Skilled Therapeutic Interventions/Progress Updates:    Pt received seated in recliner in room, agreeable to PT session. No complaints of pain. Sit to stand with CGA to min A with use of RW throughout session. Ambulation x 33', 2 x 25' with RW and up to mod A needed for balance and advancement of LLE with onset of fatigue. Pt utilizes L AFO as well as shoe cover as toe cap during gait but exhibits decreased ability to perform L knee and hip flexion with onset of fatigue. Pt also remains impulsive at times and needs cues to keep RW closer to him during gait and to eccentrically control stand to sit. Pt also noted to have L knee hyperextension during gait this date. Seated alt L/R marches with focus on maintaining midline with L knee 2 x 10 reps to fatigue. Sit to stand 3 x 10 reps from mat table to RW with CGA with focus on L knee control during transfer as well as eccentric control when sitting. Manual w/c propulsion 2 x 150 ft with use of BUE at Supervision level. Pt's family brought in [place] he uses at home, adjusted height appropriately on pt's walker. Pt left seated in recliner in room with needs in reach, chair alarm in place at end of session.  Therapy Documentation Precautions:  Precautions Precautions: Cervical,Fall Required Braces or Orthoses: Cervical Brace Cervical Brace: Hard collar Restrictions Weight Bearing Restrictions: No Other Position/Activity Restrictions: Hard collar when OOB, may apply  sitting EOB   Therapy/Group: Individual Therapy   Peter Congo, PT, DPT  08/21/2020, 5:26 PM

## 2020-08-21 NOTE — Progress Notes (Signed)
Occupational Therapy Session Note  Patient Details  Name: Keith Downs MRN: [id number] Date of Birth: [birth year]  Today's Date: 08/21/2020 OT Individual Time: 4010-2725 OT Individual Time Calculation (min): 75 min    Short Term Goals: Week 1:  OT Short Term Goal 1 (Week 1): Pt will demonstrate improved balance to be able to stand with RW and release 1 hand to pull pants up with min A. OT Short Term Goal 2 (Week 1): Pt will be able to complete squat pivot to toilet with mod A of 1. OT Short Term Goal 3 (Week 1): Pt will be able to don shirt over head with set up. OT Short Term Goal 4 (Week 1): Pt will be able to don pants over feet with min A with AE as needed.  Skilled Therapeutic Interventions/Progress Updates:    Pt sitting in the recliner to start the session.  He was able to work on dressing to start from the chair with supervision for a pullover shirt with min assist for donning the cervical collar.  He needed total assist for donning TEDs with max assist for the left shoe and AFO.  He was able donn the right slip on shoe with setup.  Min assist for sit to stand from the recliner with min instructional cueing for hand placement to push up from the arms of the chair.  He completed functional mobility from the recliner to the wheelchair with min assist using the RW for support.  Increased difficulty noted when attempting to advance the LLE, requiring occasional min assist.  He rolled himself down to the therapy gym with supervision in the wheelchair.  He then transferred stand pivot to the therapy mat with min assist.  Had him work on LUE functional use and coordination in sitting as well as standing for picking up clothes pins and placing them on horizontal bars.  Increased difficulty with completion of tip to tip using the blue clothespins in sitting.  Had him stand with use of the RW for support while reaching down with both the right and left UE with min assist to retrieve clothespins  incorporating balance, bilateral knee flexion, and reach.  Finished session with return to the room via wheelchair and transfer to the recliner with min assist using the RW.  Pt left with the call button and phone in reach.    Therapy Documentation Precautions:  Precautions Precautions: Cervical,Fall Required Braces or Orthoses: Cervical Brace Cervical Brace: Hard collar Restrictions Weight Bearing Restrictions: No Other Position/Activity Restrictions: Hard collar when OOB, may apply sitting EOB   Pain: Pain Assessment Pain Scale: 0-10 Pain Score: 4  Pain Type: Acute pain Pain Location: Back Pain Descriptors / Indicators: Aching Pain Frequency: Intermittent Pain Onset: With Activity Pain Intervention(s): Medication (See eMAR) ADL: See Care Tool Section for some details of mobility and selfcare  Therapy/Group: Individual Therapy  Raiford Fetterman OTR/L 08/21/2020, 10:31 AM

## 2020-08-21 NOTE — Progress Notes (Signed)
Physical Therapy Session Note  Patient Details  Name: Keith Downs MRN: [id number] Date of Birth: 01-16-1961  Today's Date: 08/21/2020 PT Individual Time: 1414-1530 PT Individual Time Calculation (min): 76 min   Short Term Goals: Week 1:  PT Short Term Goal 1 (Week 1): Pt will compete bed mobility with minA and no bed features PT Short Term Goal 2 (Week 1): Pt will complete bed<>chair transfers with minA and LRAD PT Short Term Goal 3 (Week 1): Pt will ambulate 57ft with modA and LRAD  Skilled Therapeutic Interventions/Progress Updates: Pt presented in recliner agreeable to therapy. Pt denies pain but states feels more stiff since am session. L shoe with [employer] for toe cap placement therefore gait deferred this session. Pt performed stand pivot transfer to w/c with minA for LLE management and cues or sequencing due to safety. Pt transported to rehab gym for energy conservation. Performed stand pivot to mat with minA and RW. Pt participated in standing balance activities including kicking ball for forced use of LLE. Performed 2 x20 kicks with minA due to increased adduction when kicking ball and increased compensatory strategies (extending back) when becoming fatigued. Pt with no overt LOB however increased L lateral lean due to poor LLE positioning with increased repetition which pt was able to correct with verbal cues. Pt also participated in several bouts of horseshoes standing with RW with pt picking up horseshoe with LUE then handing over to RLE and throwing to target. Pt was able to perform with overall minA with short bouts of CGA when provided with verbal cues to maintain TKE in LLE. Participated in several bouts of corn hole with RW support only on L side and pt performing activity with RLE. Pt required intermittent cues for L TKE but able to maintain balance with minA nearing CGA. Pt returned to w/c in same manner as prior and transported to day room. Performed stand pivot with RW to  NuStep and participated in NuStep L1 x 10 min for AROM and general conditioning. PTA provided cues for working to maintain LLE neutral throughout activity. Pt then performed x 5 min L1 with BLE only with PTA providing assist with LLE to maintain neutral. Pt returned to w/c in same manner as prior and transported back to room. Performed stand pivot with RW to recliner with minA. Pt left in recliner at end of session with call bell within reach and needs met.      Therapy Documentation Precautions:  Precautions Precautions: Cervical,Fall Required Braces or Orthoses: Cervical Brace Cervical Brace: Hard collar Restrictions Weight Bearing Restrictions: No Other Position/Activity Restrictions: Hard collar when OOB, may apply sitting EOB General:   Vital Signs:   Pain:   Mobility:   Locomotion :    Trunk/Postural Assessment :    Balance:   Exercises:   Other Treatments:      Therapy/Group: Individual Therapy  Magaby Rumberger 08/21/2020, 3:55 PM

## 2020-08-22 ENCOUNTER — Inpatient Hospital Stay (HOSPITAL_COMMUNITY): Payer: BC Managed Care – PPO | Admitting: Physical Therapy

## 2020-08-22 ENCOUNTER — Inpatient Hospital Stay (HOSPITAL_COMMUNITY): Payer: BC Managed Care – PPO

## 2020-08-22 LAB — CBC WITH DIFFERENTIAL/PLATELET
Abs Immature Granulocytes: 0.19 10*3/uL — ABNORMAL HIGH (ref 0.00–0.07)
Basophils Absolute: 0 10*3/uL (ref 0.0–0.1)
Basophils Relative: 0 %
Eosinophils Absolute: 0 10*3/uL (ref 0.0–0.5)
Eosinophils Relative: 0 %
HCT: 37.7 % — ABNORMAL LOW (ref 39.0–52.0)
Hemoglobin: 13.1 g/dL (ref 13.0–17.0)
Immature Granulocytes: 2 %
Lymphocytes Relative: 6 %
Lymphs Abs: 0.7 10*3/uL (ref 0.7–4.0)
MCH: 30 pg (ref 26.0–34.0)
MCHC: 34.7 g/dL (ref 30.0–36.0)
MCV: 86.5 fL (ref 80.0–100.0)
Monocytes Absolute: 0.5 10*3/uL (ref 0.1–1.0)
Monocytes Relative: 4 %
Neutro Abs: 9.9 10*3/uL — ABNORMAL HIGH (ref 1.7–7.7)
Neutrophils Relative %: 88 %
Platelets: 185 10*3/uL (ref 150–400)
RBC: 4.36 MIL/uL (ref 4.22–5.81)
RDW: 12.3 % (ref 11.5–15.5)
WBC: 11.4 10*3/uL — ABNORMAL HIGH (ref 4.0–10.5)
nRBC: 0 % (ref 0.0–0.2)

## 2020-08-22 LAB — BASIC METABOLIC PANEL
Anion gap: 8 (ref 5–15)
BUN: 18 mg/dL (ref 6–20)
CO2: 24 mmol/L (ref 22–32)
Calcium: 8.5 mg/dL — ABNORMAL LOW (ref 8.9–10.3)
Chloride: 99 mmol/L (ref 98–111)
Creatinine, Ser: 0.83 mg/dL (ref 0.61–1.24)
GFR, Estimated: >60 mL/min (ref >60)
Glucose, Bld: 133 mg/dL — ABNORMAL HIGH (ref 70–99)
Potassium: 4.8 mmol/L (ref 3.5–5.1)
Sodium: 131 mmol/L — ABNORMAL LOW (ref 135–145)

## 2020-08-22 MED ORDER — TRAZODONE HCL 50 MG PO TABS
50.0000 mg | ORAL_TABLET | Freq: Every day | ORAL | Status: DC
  Administered 2020-08-22 – 2020-08-29 (×8): 50 mg via ORAL
  Filled 2020-08-22 (×8): qty 1

## 2020-08-22 MED ORDER — SORBITOL 70 % SOLN
30.0000 mL | Freq: Once | Status: AC
  Administered 2020-08-22: 30 mL via ORAL
  Filled 2020-08-22: qty 30

## 2020-08-22 NOTE — Progress Notes (Signed)
[place] PHYSICAL MEDICINE & REHABILITATION PROGRESS NOTE   Subjective/Complaints:  Pt reports slept the best he's slept since here. Got muscle relaxer and trazodone for sleep. Wants me to schedule Trazodone.   Wakes up due to urinary urgency as well as when on back, feels like neck "choking him". Better tightness if sleeps on side/sitting up.  No BM in 2-3 days- asking for something to help him go-   ROS:   Pt denies SOB, abd pain, CP, N/V/C/D, and vision changes  Objective:   No results found. Recent Labs    08/22/20 0511  WBC 11.4*  HGB 13.1  HCT 37.7*  PLT 185   Recent Labs    08/20/20 0607 08/22/20 0511  NA 124* 131*  K 4.1 4.8  CL 87* 99  CO2 26 24  GLUCOSE 120* 133*  BUN 26* 18  CREATININE 0.94 0.83  CALCIUM 9.1 8.5*    Intake/Output Summary (Last 24 hours) at 08/22/2020 0838 Last data filed at 08/22/2020 0745 Gross per 24 hour  Intake 296 ml  Output 1950 ml  Net -1654 ml        Physical Exam: Vital Signs Blood pressure 107/81, pulse (!) 53, temperature 97.9 F (36.6 C), temperature source Oral, resp. rate 18, height 5\' 11"  (1.803 m), SpO2 99 %.  Constitutional: sitting up in bedside chair with OT at side, NAD HEENT: EOMI, oral membranes moist Neck: cervical collar in place- incision anterior- with steristrips- looks the same Cardiovascular: mildly bradycardic regular rhythm   Respiratory/Chest: CTA B/L- no W/R/R- good air movement GI/Abdomen: Soft, NT, ND, (+)BS -hypoactive Ext: no clubbing, cyanosis, or edema Psych: pleasant, appropriate- smiling Musculoskeletal:        General: Swelling (1+ pedal edema bilaterally) present.     Comments: Left lower extremity tenderness     Tightness, pain along mid to upper right trap with palpation Skin:    Comments: Neck incision CDI with steri strips in place.   Neurological: better oriented - Ox3 LLE MAS of 3-  Motor: RUE: 5/5 proximal distal LUE: 4 -/5 proximal to distal RLE: 4 --4/5 proximal  distal LLE: 3+-4 -/5 hip flexion, knee extension, 2/5 ankle dorsiflexion Sensation diminished light touch LUE/LLE--- no change       Assessment/Plan: 1. Functional deficits which require 3+ hours per day of interdisciplinary therapy in a comprehensive inpatient rehab setting.  Physiatrist is providing close team supervision and 24 hour management of active medical problems listed below.  Physiatrist and rehab team continue to assess barriers to discharge/monitor patient progress toward functional and medical goals  Care Tool:  Bathing    Body parts bathed by patient: Right arm,Left arm,Chest,Abdomen,Front perineal area,Right upper leg,Left upper leg,Right lower leg,Left lower leg,Face   Body parts bathed by helper: Left lower leg,Buttocks     Bathing assist Assist Level: Minimal Assistance - Patient > 75%     Upper Body Dressing/Undressing Upper body dressing   What is the patient wearing?: Pull over shirt,Orthosis    Upper body assist Assist Level: Minimal Assistance - Patient > 75%    Lower Body Dressing/Undressing Lower body dressing      What is the patient wearing?: Pants     Lower body assist Assist for lower body dressing: Minimal Assistance - Patient > 75%     Toileting Toileting    Toileting assist Assist for toileting: Moderate Assistance - Patient 50 - 74%     Transfers Chair/bed transfer  Transfers assist     Chair/bed  transfer assist level: Minimal Assistance - Patient > 75%     Locomotion Ambulation   Ambulation assist      Assist level: Minimal Assistance - Patient > 75% Assistive device: Walker-rolling Max distance: 6'   Walk 10 feet activity   Assist     Assist level: Minimal Assistance - Patient > 75% Assistive device: Walker-rolling   Walk 50 feet activity   Assist Walk 50 feet with 2 turns activity did not occur: Safety/medical concerns         Walk 150 feet activity   Assist Walk 150 feet activity did not  occur: Safety/medical concerns         Walk 10 feet on uneven surface  activity   Assist Walk 10 feet on uneven surfaces activity did not occur: Safety/medical concerns         Wheelchair     Assist Will patient use wheelchair at discharge?: No Type of Wheelchair: Manual    Wheelchair assist level: Supervision/Verbal cueing Max wheelchair distance: 150    Wheelchair 50 feet with 2 turns activity    Assist        Assist Level: Supervision/Verbal cueing   Wheelchair 150 feet activity     Assist      Assist Level: Supervision/Verbal cueing   Blood pressure 107/81, pulse (!) 53, temperature 97.9 F (36.6 C), temperature source Oral, resp. rate 18, height 5\' 11"  (1.803 m), SpO2 99 %.  Medical Problem List and Plan: 1.  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             -ELOS/Goals: 14-18 days/supervision/min a  1/22- MAY shower- just cover incision  1/24: plan to wean decadron?  1/25- decrease decadron to 2 mg BID x 3 days then QHS x3 days then stop              -Continue CIR therapies including PT, OT  Improved 2.  Antithrombotics: -DVT/anticoagulation:  Mechanical: Sequential compression devices, below knee Bilateral lower extremities  1/22- started lovenox             -antiplatelet therapy: N/a 3. Pain Management: On oxycodone prn.    1/26- pain controlled- spasticity a little better as below- con't regimen  1/27- muscle tightness in naterior neck bothersome- muscle relaxer helps- con't regimen             Monitor with increased exertion, particularly for neuropathic pain 4. Mood: LCSW to follow for evaluation and support.              -antipsychotic agents: N/A 5. Neuropsych: This patient is capable of making decisions on his own behalf. 6. Skin/Wound Care: Routine pressure relief measures 7. Fluids/Electrolytes/Nutrition: Monitor I/Os             1/24.  Hyponatremia of 126 (136 08/15/20), normal BUN/Cr/K+   -hold HCTZ (already received today)   -recheck bmet tomorrow  1/25- Na down to 124 today- off HCTZ- since BUN also up to 26- will give NS IVFs 75cc hr x 1 days and BP also somewhat low- could be worse due to steroids which I'm tapering.   1/26- pt also admits has been drinking a LOT due to dry mouth- suggested gum/ice/hard candy to help dry mouth since drinking too much  1/27- Na up to 131 and BUN down to 18- doing much better- will recheck in AM since off IVFs 8. HTN: Monitor BP tid--continue HCTZ and  Avapro. Monitor for orthostasis.   1/20- BP 127/84- doing well -con't regimen  1/26- BP 111/60s- will do IVFs as above.  9. GERD: Resume PPI.   1/22- increased to BID per pt request from Sx's.  10. Hypothyroid: Continue supplement 11. COPD: Encourage pulmonary hygiene with IS and monitor respiratory status with increase in activity. Continue Dulera with albuterol prn SOB. .  12. Spasticity: Baclofen tid-->may need titration upwards to help manage symptoms.   1/20- will increase Baclofen to 10 mg QID and then add prn Zanaflex 4 mg TID prn for increasing spasms, esp at night- explained can cause sedation/constipaiton. Of note, spasticity worse at night.   1/21- forgot to take zanaflex- will write to offer it- slept better with less cramping- will increase Baclofen Sat/Sunday  1/22- forgetting Zanaflex- will scheduled 4 mg QHS as well as baclofen  1/23-24- spasticity improving- once discharged, can possibly arrange for Botox toxin after d/c. Titrate tizanidine further as needed 13. Leukocytosis: likely steroids  1/20- WBC 13.4- but on decadron- will check U/A and Cx to make sure doesn't have UTI.    1/21- U/A (-)- is from decadron  1/22- questionable dysuria last night- pt denied?  1/23- pt denies dysuria today and hematuria- but sounds like he had yesterday based on his language now.  1/26- weaning Steroids to help with leukocytosis.  1/27-  WBC 11.2- doing better with decrease in steroids  14. Frequent urination  1/21- hx of prostate enlargement- will start Flomax -.4 mg q supper- since sounds like not emptying.  1/23- peeing ~350cc at a time- so less often- con't flomax  1/27- Also on finesteride 5mg  QHS- per Urology- is voiding/emptying better per pt, but still has urgency.  15. Hematuria  1/25- documented in chart x 2- U/A on 1/20 was (-) for hematuria/blood in urine- will call Urology to see what the next step would be.   1/27- On finesteride- no more hematuria so far- con't to monitor 16. Hyponatremia  1/25- down to 124- will give IVFs NS 75cc/hr - could have been due to HCTZ? Which has stopped or Decadron, which I'm tapering- will monitor.   1/26- will check CBC and BMP in AM- suggest not drinking so much so can improve Na and as wean steroids, should also help.   1/27- Na up to 131 and BUN odwn to 18- off IVFs- con't regimen 17. Constipation  1/27- will order sorbitol x1 at 1400 18. Insomnia  1/27- will schedule trazodone 50 mg QHS.      LOS: 8 days A FACE TO FACE EVALUATION WAS PERFORMED  Kinsleigh Ludolph 08/22/2020, 8:38 AM

## 2020-08-22 NOTE — Progress Notes (Signed)
Occupational Therapy Session Note  Patient Details  Name: Keith Downs MRN: [id number] Date of Birth: 02-24-1961  Today's Date: 08/22/2020 OT Individual Time: 6606-3016 OT Individual Time Calculation (min): 75 min    Short Term Goals: Week 2:  OT Short Term Goal 1 (Week 2): STG=LTG secondary to ELOS  Skilled Therapeutic Interventions/Progress Updates:    Pt resting in recliner upon arrival and requesting to take a shower. Stand pivot transer recliner>w/c>TTB>w/c>recliner with min A and min verbal cues for safety. Bathing with sit<>stand from TTB with min A. Pt returned to room and completed dressing with sit<>stand from recliner. Min A for standing balance to pull pants over hips. Pt required assistance donning Ted Hose and L shoe/AFO. Pt transitioned to day room and engaged in table activity with playing cards. Pt challenged to pick up each card from deck of cards using his Lt hand. Pt also challenged with holding deck of cards in Lt hand and removing cards individually with Rt hand. Pt donned gloves and cleaned cards before returning back to room. Pt amb with RW 5' to recliner. Pt remained in recliner with all needs within reach and seat alarm activated.   Therapy Documentation Precautions:  Precautions Precautions: Cervical,Fall Required Braces or Orthoses: Cervical Brace Cervical Brace: Hard collar Restrictions Weight Bearing Restrictions: No Other Position/Activity Restrictions: Hard collar when OOB, may apply sitting EOB Pain: Pain Assessment Pain Scale: 0-10 Pain Score: 4  Pain Type: Acute pain Pain Location: Neck Pain Orientation: Posterior Pain Descriptors / Indicators: Aching Pain Frequency: Intermittent Pain Onset: With Activity Patients Stated Pain Goal: 2 Pain Intervention(s): RN aware and meds admin at end of session     Therapy/Group: Individual Therapy  Rich Brave 08/22/2020, 10:45 AM

## 2020-08-22 NOTE — Progress Notes (Signed)
Physical Therapy Session Note  Patient Details  Name: Keith Downs MRN: [id number] Date of Birth: 06-11-1961  Today's Date: 08/22/2020 PT Individual Time: 1420-1505 PT Individual Time Calculation (min): 45 min   Short Term Goals: Week 1:  PT Short Term Goal 1 (Week 1): Pt will compete bed mobility with minA and no bed features PT Short Term Goal 2 (Week 1): Pt will complete bed<>chair transfers with minA and LRAD PT Short Term Goal 3 (Week 1): Pt will ambulate 28ft with modA and LRAD  Skilled Therapeutic Interventions/Progress Updates:   Pt received sitting in recliner and agreeable to PT. Pt performed ambulatory transfer to [facility] with min assist overall and moderate cues for improved step to gait pattern to maintain control of the LL and allow increased step length/width with pivot to WC.   Pt transported to day room in [facility]. PT instructed p tin seated LE therex: hip abduction with manual resistance x 12, LAQ x 10, reciprocal hip flexion with AAROM on the L x 10, heel slides with towel on floor x 12 BLE and AAROM for last 5 reps on the L. PT instructed pt in standing balance/tolerate while engaged in fine/ motor task of Wii bowling. Pt able to tolerate standing x 5 frames with min-mod assist to maintain balance with 1-2 UE support on RW. Moderate cues for terminal knee extension on the L as well as improved posture in hips to prevent flexed posture. Intermittent posterior LOB with moderate cues for ankle strategy to correct LOB.   WC mobility through hall 2 x 175ft with cues for safety through tight space of hospital room.   Kinetron BLE seated marches x 4 minutes, 30 cm/sec, with cues for improved reciprocal pattern to prevent gluteal activion simultaneously.   Patient returned to room and performed stand pivot to recliner with RW and min assist for safety. Pt left sitting in recliner with call bell in reach and all needs met.          Therapy Documentation Precautions:   Precautions Precautions: Cervical,Fall Required Braces or Orthoses: Cervical Brace Cervical Brace: Hard collar Restrictions Weight Bearing Restrictions: No Other Position/Activity Restrictions: Hard collar when OOB, may apply sitting EOB Vital Signs: Therapy Vitals Temp: 97.7 F (36.5 C) Pulse Rate: 90 Resp: 18 BP: 116/71 Patient Position (if appropriate): Sitting Oxygen Therapy SpO2: 98 % O2 Device: Room Air Pain: Pain Assessment Pain Scale: 0-10 Pain Score: 0-No pain   Therapy/Group: Individual Therapy  Lorie Phenix 08/22/2020, 3:33 PM

## 2020-08-22 NOTE — Progress Notes (Signed)
Occupational Therapy Weekly Progress Note  Patient Details  Name: Keith Downs MRN: [id number] Date of Birth: 02/20/1961  Beginning of progress report period: August 15, 2020 End of progress report period: August 22, 2020  Patient has met 4 of 4 short term goals.  Pt made good progress with BADLs, standing balance, and functional transfers since admission. Pt completes bathing tasks at shower level with min A sit<>stand using grab bars. Pt currently requires min A for donning cervical collar but is able to don pullover shirt with supervision. Pt is able to thread BLE into pants but requires min A for standing balance when pulling pants over hips. Pt requires tot A for donning [employer]. Pt requires max A for donning Lt shoe and AFO. Squat pivot and stand pivot transfers with min A/CGA. Pt currently using LUE at diminished level during functional tasks with decreased shoulder strength as well as decreased FM coordination and strength.    Patient continues to demonstrate the following deficits: muscle weakness and muscle paralysis, impaired timing and sequencing, abnormal tone, unbalanced muscle activation and decreased coordination and decreased standing balance and decreased balance strategies and therefore will continue to benefit from skilled OT intervention to enhance overall performance with BADL and Reduce care partner burden.  Patient progressing toward long term goals..  Continue plan of care.  OT Short Term Goals Week 1:  OT Short Term Goal 1 (Week 1): Pt will demonstrate improved balance to be able to stand with RW and release 1 hand to pull pants up with min A. OT Short Term Goal 1 - Progress (Week 1): Met OT Short Term Goal 2 (Week 1): Pt will be able to complete squat pivot to toilet with mod A of 1. OT Short Term Goal 2 - Progress (Week 1): Met OT Short Term Goal 3 (Week 1): Pt will be able to don shirt over head with set up. OT Short Term Goal 3 - Progress (Week 1): Met OT  Short Term Goal 4 (Week 1): Pt will be able to don pants over feet with min A with AE as needed. OT Short Term Goal 4 - Progress (Week 1): Met Week 2:  OT Short Term Goal 1 (Week 2): STG=LTG secondary to ELOS   Thomasene Lot OTR/L  08/22/2020, 6:41 AM

## 2020-08-22 NOTE — Progress Notes (Signed)
Occupational Therapy Session Note  Patient Details  Name: Keith Downs MRN: [id number] Date of Birth: [birth year]  Today's Date: 08/22/2020 OT Individual Time: 1300-1330 OT Individual Time Calculation (min): 30 min    Short Term Goals: Week 2:  OT Short Term Goal 1 (Week 2): STG=LTG secondary to ELOS  Skilled Therapeutic Interventions/Progress Updates:    OT intervention with focus on problem solving donning Lt shoe and AFO and BUE/BLE therex. Practiced using shoe funnel to don Lt shoe and AFO without success. Recommended getting a shoe 1/2 size larger to facilitate donning shoe/AFO. Pt amb with RW 5' to w/c and propelled to day room. 5 mins NuStep level 6 BUE/BLE and 5 mins BLE only level 1. Pt returned to w/c and propelled back to room. Pt returned to recliner. Pt remained in recliner. Seat alarm activated. Oral hygiene supplies on table per request. All other needs within reach.   Therapy Documentation Precautions:  Precautions Precautions: Cervical,Fall Required Braces or Orthoses: Cervical Brace Cervical Brace: Hard collar Restrictions Weight Bearing Restrictions: No Other Position/Activity Restrictions: Hard collar when OOB, may apply sitting EOB   Pain: Pain Assessment Pain Scale: 0-10 Pain Score: 0-No pain   Therapy/Group: Individual Therapy  Rich Brave 08/22/2020, 2:36 PM

## 2020-08-22 NOTE — Progress Notes (Signed)
Physical Therapy Session Note  Patient Details  Name: Keith Downs MRN: [id number] Date of Birth: [birth year]  Today's Date: 08/22/2020 PT Individual Time: 1108-1203 PT Individual Time Calculation (min): 55 min   Short Term Goals: Week 1:  PT Short Term Goal 1 (Week 1): Pt will compete bed mobility with minA and no bed features PT Short Term Goal 2 (Week 1): Pt will complete bed<>chair transfers with minA and LRAD PT Short Term Goal 3 (Week 1): Pt will ambulate 42ft with modA and LRAD  Skilled Therapeutic Interventions/Progress Updates: Pt presented in w/c agreeable to therapy. Pt denies pain throughout session. Pt with shoes and AFO already on, propelled to rehab gym with supervision. Participated in gait training performed with RW 30ft, 40ft x 2 with min fading to modA. Pt required cues for TKE in stance phase and to flex knee in swing phase. Pt intermittently unable to advance LLE however with brief standing rest and repositioning pt was able to complete ambulation trials. Pt performed stand pivot to mat with RW and minA. Performed toe taps to target with AA from PTA for hip flexion x 10. Performed hamstring stretch 30sec x 3. Pt then transitioned to supine requiring minA for BLE and rolled to R with minA and cues for BLE placement. With powder board performed gravity eliminated hip flexion 3 x 10 initially requiring AA then progressing to AROM. Peformed AA hip abd 2 x 10 with PTA blocking pt to decrease rotation, then PTA performed adductor stretch 30 sec x 3. Pt then rolled to L with minA and performed sidelying to sitting with CGA and verbal cues for sequencing. Performed stand pivot to w/c with CGA and pt propelled to room supervision. Performed stand pivot w/c to recliner and pt left in recliner at end of session with seat alarm on, call bell within reach and needs met.      Therapy Documentation Precautions:  Precautions Precautions: Cervical,Fall Required Braces or Orthoses:  Cervical Brace Cervical Brace: Hard collar Restrictions Weight Bearing Restrictions: No Other Position/Activity Restrictions: Hard collar when OOB, may apply sitting EOB General:   Vital Signs: Therapy Vitals Temp: 97.7 F (36.5 C) Pulse Rate: 90 Resp: 18 BP: 116/71 Patient Position (if appropriate): Sitting Oxygen Therapy SpO2: 98 % O2 Device: Room Air Pain: Pain Assessment Pain Scale: 0-10 Pain Score: 0-No pain Mobility:   Locomotion :    Trunk/Postural Assessment :    Balance:   Exercises:   Other Treatments:      Therapy/Group: Individual Therapy  Dyer Klug 08/22/2020, 4:20 PM

## 2020-08-23 MED ORDER — SORBITOL 70 % SOLN
30.0000 mL | Freq: Once | Status: AC
  Administered 2020-08-23: 30 mL via ORAL
  Filled 2020-08-23: qty 30

## 2020-08-23 MED ORDER — BISACODYL 10 MG RE SUPP
10.0000 mg | Freq: Once | RECTAL | Status: DC
  Filled 2020-08-23 (×2): qty 1

## 2020-08-23 MED ORDER — POLYETHYLENE GLYCOL 3350 17 G PO PACK
17.0000 g | PACK | Freq: Every day | ORAL | Status: DC
  Administered 2020-08-23 – 2020-08-27 (×5): 17 g via ORAL
  Filled 2020-08-23 (×6): qty 1

## 2020-08-23 NOTE — Progress Notes (Signed)
Occupational Therapy Session Note  Patient Details  Name: Keith Downs MRN: [id number] Date of Birth: Mar 31, 1961  Today's Date: 08/23/2020 OT Individual Time: 0700-0810 OT Individual Time Calculation (min): 70 min    Short Term Goals: Week 2:  OT Short Term Goal 1 (Week 2): STG=LTG secondary to ELOS  Skilled Therapeutic Interventions/Progress Updates:    OT intervention with focus on sit<>stand, standing balance, functional amb with RW, bathing at shower level, dressing with sit<>stand from recliner, safety awareness, and activity tolerance to increase independence with BADLs. Sit<>stand this morning with CGA. Stand pivot transfers with CGA in/out of shower and recliner<>w/c. Standing balance with CGA/min A for pulling up pants. Pt required assistance bathing buttocks while standing. Pt able to thread pants over BLE. Pt donned Rt shoe but required assistance donning Lt shoe and AFO. Functional amb with RW at CGA/min A. Pt remained in recliner with all needs within reach and seat alarm activated.   Therapy Documentation Precautions:  Precautions Precautions: Cervical,Fall Required Braces or Orthoses: Cervical Brace Cervical Brace: Hard collar Restrictions Weight Bearing Restrictions: No Other Position/Activity Restrictions: Hard collar when OOB, may apply sitting EOB  Pain: Pain Assessment Pain Scale: 0-10 Pain Score: 0-No pain   Therapy/Group: Individual Therapy  Rich Brave 08/23/2020, 8:15 AM

## 2020-08-23 NOTE — Consult Note (Signed)
Neuropsychological Consultation   Patient:   Keith Downs   DOB:   [birth year]  MR Number:  [id number]  Location:  [facility] [address] CENTER A [address] [id number] [place] [address] Dept: [phone] Loc: [phone]           Date of Service:   08/23/2020  Start Time:   8:15 AM End Time:   8:58 AM  Provider/Observer:  Arley Phenix, Psy.D.       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  Patient failed conservative management was admitted on 08/09/2021 for ACDF of C3-C6 with arthrodesis of C3-C4 and C4--C6 by Dr. Milta Deiters.  Postoperatively patient found to have left upper extremity greater than right upper extremity weakness, flaccid left lower extremity and right lower extremity weakness with increased tone.  MRI C-spine was negative for hematoma and showed interval decompression with improvement in spinal stenosis.  Weakness felt to be due to cord reexpansion after severe compression.  Patient continued to be limited by ataxia and weakness affecting ADLs.  CIR was recommended and referred for physical medicine due to functional decline.  Patient initially had a great deal of emotional responses with fear of lifelong paralysis.  Reason for Service:  Patient was referred for neuropsychological consultation due to coping and adjustment issues.  Below see HPI for the current admission.  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  History taken from chart  review and patient.  He failed conservative management and was admitted on 08/09/2021 for ACDF of C3-C6 with arthrodesis of C3-C4 and C4-C6 by Dr. Yetta Barre. Post surgery in PACU he was found to have LUE>RUE weakness, flaccid LLE and RLE weakness with increased tone. MRI C spine was negative for hematoma and showed interval decompression with improvement in spinal stenosis. Weakness felt to be due to cord reexpansion after severe compression and he was started on Decadron with slow improvement.  Keith Downs was added for spasticity and foley in place due to neurogenic bladder. Therapy has been ongoing and he continues to be limited by ataxia with bilateral lower extremity weakness, DOE and LUE weakness affecting ADLs. CIR recommended due to functional decline.  Please see preadmission assessment from earlier today as well.  Current Status:  The patient was sitting up right in his wheelchair with hard collar applied.  Patient was alert and oriented with good cognition and good expressive/receptive language functioning.  The patient admitted to some worry and concern about where he will end up after recovery but becoming increasingly positive as he is seeing significant functional gains during therapeutic interventions.  Patient describes improved motor functioning but continued weakness particularly for his left leg and left hand.  Patient reports that he continues to have paresthesias or other abnormal tactile sensations.  Behavioral Observation: Keith Downs  presents as a 60 y.o.-year-old Right Caucasian Male who appeared his stated age. his dress was Appropriate and he was Well Groomed and his manners were Appropriate to the situation.  his participation was indicative  of Appropriate and Attentive behaviors.  There were physical disabilities noted.  he displayed an appropriate level of cooperation and motivation.     Interactions:    Active Appropriate and Attentive  Attention:   within normal limits and  attention span and concentration were age appropriate  Memory:   within normal limits; recent and remote memory intact  Visuo-spatial:  within normal limits  Speech (Volume):  normal  Speech:   normal; normal  Thought Process:  Coherent and Relevant  Though Content:  WNL; not suicidal and not homicidal  Orientation:   person, place, time/date and situation  Judgment:   Good  Planning:   Good  Affect:    Appropriate  Mood:    Euthymic  Insight:   Good  Intelligence:   high  Medical History:   Past Medical History:  Diagnosis Date  . Ambulates with cane   . Anemia   . Arthritis    back, knee  . Asthma   . BPH (benign prostatic hyperplasia)   . ED (erectile dysfunction)   . Foot drop, left    some in right foot  . GERD (gastroesophageal reflux disease)   . Headache   . History of hiatal hernia    patient denied this dx  . HLD (hyperlipidemia)   . Hypertension   . Hypothyroidism   . Neuromuscular disorder (HCC)    numbness in right hand  . Peripheral vascular disease (HCC)   . Seasonal allergies   . Valvular heart disease    followed by [facility]         Patient Active Problem List   Diagnosis Date Noted  . Cervical myelopathy (HCC) 08/14/2020  . 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 Essential hypertension   . Paraparesis (HCC)   . Postoperative pain   . Spasticity   . S/P cervical spinal fusion 08/09/2020  . S/P cervical spinal fusion 08/09/2020              Abuse/Trauma History: Or patient denies any past significant history of abuse/trauma and no prior psychiatric history the patient did have sudden change in cervical/neurological functioning following neurosurgical interventions for herniated nucleus pulposus with fear of permanent motor function loss.  Psychiatric History:  No prior psychiatric history  Family Med/Psych History:  Family History  Problem Relation Age of Onset  . 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  Patient failed conservative management was admitted on 08/09/2021 for ACDF of C3-C6 with arthrodesis of C3-C4 and C4--C6 by Dr. Yetta Barre.  Postoperatively patient found to have left upper extremity greater than right upper extremity weakness, flaccid left lower extremity and right lower extremity weakness with increased tone.  MRI C-spine was negative for hematoma and showed interval decompression with improvement in spinal stenosis.  Weakness felt to be due to cord reexpansion after severe compression.  Patient continued to be limited by ataxia and weakness affecting ADLs.  CIR was recommended and referred for physical medicine due to functional decline.  Patient initially had a great deal of emotional responses with fear of lifelong paralysis.  The patient was sitting up right in his wheelchair with hard collar applied.  Patient was alert and oriented with good cognition and good expressive/receptive  language functioning.  The patient admitted to some worry and concern about where he will end up after recovery but becoming increasingly positive as he is seeing significant functional gains during therapeutic interventions.  Patient describes improved motor functioning but continued weakness particularly for his left leg and left hand.  Patient reports that he continues to have paresthesias or other abnormal tactile sensations.  Disposition/Plan:  Today we worked on coping and adjustment issues around residual neurological changes due to cervical changes pre and post ACDF.         Electronically Signed   _______________________ Arley Phenix, Psy.D. Clinical Neuropsychologist

## 2020-08-23 NOTE — Progress Notes (Signed)
Physical Therapy Weekly Progress Note  Patient Details  Name: Keith Downs MRN: [id number] Date of Birth: 1960/11/30  Beginning of progress report period: August 16, 2019 End of progress report period: August 24, 2019  Today's Date: 08/23/2020     Patient has met 2 of 3 short term goals.  Pt has made good progress during current course of therapy. Pt with decreased tone in LLE and is able to progress ambulation to distance up to 61f with RW and minA. Pt continues to demonstrate significant weakness in hip flexors for ambulation and stairs but continues to improve. Pt has more awareness of LLE and is able to maintain TKE when standing more consistently. Anticipate family education to start this week in preparation for d/c.   Patient continues to demonstrate the following deficits muscle weakness and muscle paralysis and impaired timing and sequencing, abnormal tone, unbalanced muscle activation and decreased coordination and therefore will continue to benefit from skilled PT intervention to increase functional independence with mobility.  Patient not progressing toward long term goals.  See goal revision..  Continue plan of care.  PT Short Term Goals Week 1:  PT Short Term Goal 1 (Week 1): Pt will compete bed mobility with minA and no bed features PT Short Term Goal 1 - Progress (Week 1): Met PT Short Term Goal 2 (Week 1): Pt will complete bed<>chair transfers with minA and LRAD PT Short Term Goal 2 - Progress (Week 1): Met PT Short Term Goal 3 (Week 1): Pt will ambulate 586fwith modA and LRAD PT Short Term Goal 3 - Progress (Week 1): Progressing toward goal Week 2:  PT Short Term Goal 1 (Week 2): = to LTGs based on ELOS  Skilled Therapeutic Interventions/Progress Updates:  Ambulation/gait training;Balance/vestibular training;Cognitive remediation/compensation;Community reintegration;Discharge planning;Disease management/prevention;DME/adaptive equipment instruction;Functional  electrical stimulation;Neuromuscular re-education;Functional mobility training;Pain management;Patient/family education;Psychosocial support;Skin care/wound management;Splinting/orthotics;Stair training;Therapeutic Activities;Therapeutic Exercise;UE/LE Strength taining/ROM;UE/LE Coordination activities;Visual/perceptual remediation/compensation;Wheelchair propulsion/positioning   Therapy Documentation Precautions:  Precautions Precautions: Cervical,Fall Required Braces or Orthoses: Cervical Brace Cervical Brace: Hard collar Restrictions Weight Bearing Restrictions: No Other Position/Activity Restrictions: Hard collar when OOB, may apply sitting EOB  Therapy/Group: Individual Therapy  Salih Williamson  Taesha Goodell, PTA   [facility] SpiroPT, DPT, CSRS  08/23/2020, 7:56 AM

## 2020-08-23 NOTE — Progress Notes (Signed)
[place] PHYSICAL MEDICINE & REHABILITATION PROGRESS NOTE   Subjective/Complaints:  Pt reports no BM- even after sorbitol- felt like could go, but hasn't yet.   NO hematuria since started Finesteride. Peeing well- less urinary urgency      ROS:  Pt denies SOB, abd pain, CP, N/V/C/D, and vision changes  Objective:   No results found. Recent Labs    08/22/20 0511  WBC 11.4*  HGB 13.1  HCT 37.7*  PLT 185   Recent Labs    08/22/20 0511  NA 131*  K 4.8  CL 99  CO2 24  GLUCOSE 133*  BUN 18  CREATININE 0.83  CALCIUM 8.5*    Intake/Output Summary (Last 24 hours) at 08/23/2020 0835 Last data filed at 08/23/2020 0700 Gross per 24 hour  Intake 1120 ml  Output 1200 ml  Net -80 ml        Physical Exam: Vital Signs Blood pressure 112/62, pulse (!) 55, temperature 99.3 F (37.4 C), resp. rate 19, height 5\' 11"  (1.803 m), SpO2 100 %.  Constitutional: sitting up in bedside chair, appropriate, wearing collar, NAD HEENT: EOMI, oral membranes moist Neck: cervical collar in place- incision anterior- with steristrips- looks stable Cardiovascular: mildly bradycardic- regular rhythm   Respiratory/Chest: CTA B/L- no W/R/R- good air movement GI/Abdomen: soft, somewhat distended, NT, hypoactive BS- feels higher up Ext: no clubbing, cyanosis, or edema Psych: appropriate Musculoskeletal:        General: Swelling (1+ pedal edema bilaterally) present.     Comments: Left lower extremity tenderness     Tightness, pain along mid to upper right trap with palpation Skin:    Comments: Neck incision CDI with steri strips in place.   Neurological: better oriented - Ox3 LLE MAS of 3-  Motor: RUE: 5/5 proximal distal LUE: 4 -/5 proximal to distal RLE: 4 --4/5 proximal distal LLE: 3+-4 -/5 hip flexion, knee extension, 2/5 ankle dorsiflexion Sensation diminished light touch LUE/LLE--- no change       Assessment/Plan: 1. Functional deficits which require 3+ hours per day of  interdisciplinary therapy in a comprehensive inpatient rehab setting.  Physiatrist is providing close team supervision and 24 hour management of active medical problems listed below.  Physiatrist and rehab team continue to assess barriers to discharge/monitor patient progress toward functional and medical goals  Care Tool:  Bathing    Body parts bathed by patient: Right arm,Left arm,Chest,Abdomen,Front perineal area,Right upper leg,Left upper leg,Right lower leg,Left lower leg,Face   Body parts bathed by helper: Buttocks     Bathing assist Assist Level: Minimal Assistance - Patient > 75%     Upper Body Dressing/Undressing Upper body dressing   What is the patient wearing?: Pull over shirt,Orthosis    Upper body assist Assist Level: Minimal Assistance - Patient > 75%    Lower Body Dressing/Undressing Lower body dressing      What is the patient wearing?: Pants     Lower body assist Assist for lower body dressing: Contact Guard/Touching assist     Toileting Toileting    Toileting assist Assist for toileting: Moderate Assistance - Patient 50 - 74%     Transfers Chair/bed transfer  Transfers assist     Chair/bed transfer assist level: Minimal Assistance - Patient > 75%     Locomotion Ambulation   Ambulation assist      Assist level: Minimal Assistance - Patient > 75% Assistive device: Walker-rolling Max distance: 6'   Walk 10 feet activity   Assist  Assist level: Minimal Assistance - Patient > 75% Assistive device: Walker-rolling   Walk 50 feet activity   Assist Walk 50 feet with 2 turns activity did not occur: Safety/medical concerns         Walk 150 feet activity   Assist Walk 150 feet activity did not occur: Safety/medical concerns         Walk 10 feet on uneven surface  activity   Assist Walk 10 feet on uneven surfaces activity did not occur: Safety/medical concerns         Wheelchair     Assist Will patient use  wheelchair at discharge?: No Type of Wheelchair: Manual    Wheelchair assist level: Supervision/Verbal cueing Max wheelchair distance: 150    Wheelchair 50 feet with 2 turns activity    Assist        Assist Level: Supervision/Verbal cueing   Wheelchair 150 feet activity     Assist      Assist Level: Supervision/Verbal cueing   Blood pressure 112/62, pulse (!) 55, temperature 99.3 F (37.4 C), resp. rate 19, height 5\' 11"  (1.803 m), SpO2 100 %.  Medical Problem List and Plan: 1.  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             -ELOS/Goals: 14-18 days/supervision/min a  1/22- MAY shower- just cover incision  1/24: plan to wean decadron?  1/25- decrease decadron to 2 mg BID x 3 days then QHS x3 days then stop              -Continue CIR therapies including PT, OT  Improved 2.  Antithrombotics: -DVT/anticoagulation:  Mechanical: Sequential compression devices, below knee Bilateral lower extremities  1/22- started lovenox             -antiplatelet therapy: N/a 3. Pain Management: On oxycodone prn.    1/26- pain controlled- spasticity a little better as below- con't regimen  1/27- muscle tightness in naterior neck bothersome- muscle relaxer helps- con't regimen  1/28- reports mucle relaxer is helping neck tightness some- con't regimen             Monitor with increased exertion, particularly for neuropathic pain 4. Mood: LCSW to follow for evaluation and support.              -antipsychotic agents: N/A 5. Neuropsych: This patient is capable of making decisions on his own behalf. 6. Skin/Wound Care: Routine pressure relief measures 7. Fluids/Electrolytes/Nutrition: Monitor I/Os             1/24. Hyponatremia of 126 (136 08/15/20), normal BUN/Cr/K+   -hold HCTZ (already received today)   -recheck bmet tomorrow  1/25- Na down to 124 today- off HCTZ- since BUN also up to 26-  will give NS IVFs 75cc hr x 1 days and BP also somewhat low- could be worse due to steroids which I'm tapering.   1/26- pt also admits has been drinking a LOT due to dry mouth- suggested gum/ice/hard candy to help dry mouth since drinking too much  1/27- Na up to 131 and BUN down to 18- doing much better- will recheck in AM since off IVFs  1/28- labs Monday with improvement in labs- if Symptomatic, recheck BMP 8. HTN: Monitor BP tid--continue HCTZ and Avapro. Monitor for orthostasis.   1/20- BP 127/84- doing well -con't regimen  1/26- BP 111/60s- will do IVFs as above.  9. GERD: Resume PPI.   1/22-  increased to BID per pt request from Sx's.  10. Hypothyroid: Continue supplement 11. COPD: Encourage pulmonary hygiene with IS and monitor respiratory status with increase in activity. Continue Dulera with albuterol prn SOB. .  12. Spasticity: Baclofen tid-->may need titration upwards to help manage symptoms.   1/20- will increase Baclofen to 10 mg QID and then add prn Zanaflex 4 mg TID prn for increasing spasms, esp at night- explained can cause sedation/constipaiton. Of note, spasticity worse at night.   1/21- forgot to take zanaflex- will write to offer it- slept better with less cramping- will increase Baclofen Sat/Sunday  1/22- forgetting Zanaflex- will scheduled 4 mg QHS as well as baclofen  1/23-24- spasticity improving- once discharged, can possibly arrange for Botox toxin after d/c. Titrate tizanidine further as needed  1/28- spasticity slightly better in LLE- will hold off making changes today, but if need be, can increase Baclofen OR Zanaflex over the weekend.  13. Leukocytosis: likely steroids  1/20- WBC 13.4- but on decadron- will check U/A and Cx to make sure doesn't have UTI.    1/21- U/A (-)- is from decadron  1/22- questionable dysuria last night- pt denied?  1/23- pt denies dysuria today and hematuria- but sounds like he had yesterday based on his language now.  1/26- weaning  Steroids to help with leukocytosis.  1/27- WBC 11.2- doing better with decrease in steroids  14. Frequent urination  1/21- hx of prostate enlargement- will start Flomax -.4 mg q supper- since sounds like not emptying.  1/23- peeing ~350cc at a time- so less often- con't flomax  1/27- Also on finesteride 5mg  QHS- per Urology- is voiding/emptying better per pt, but still has urgency.   1/28- urgency better- con't regimen 15. Hematuria  1/25- documented in chart x 2- U/A on 1/20 was (-) for hematuria/blood in urine- will call Urology to see what the next step would be.   1/27- On finesteride- no more hematuria so far- con't to monitor 16. Hyponatremia  1/25- down to 124- will give IVFs NS 75cc/hr - could have been due to HCTZ? Which has stopped or Decadron, which I'm tapering- will monitor.   1/26- will check CBC and BMP in AM- suggest not drinking so much so can improve Na and as wean steroids, should also help.   1/27- Na up to 131 and BUN odwn to 18- off IVFs- con't regimen 17. Constipation  1/27- will order sorbitol x1 at 1400  1/28- will give another dose of sorbitol this AM and schedule suppository ~ 1500 after therapy to get him t go- also added Miralax daily for bowels.  18. Insomnia  1/27- will schedule trazodone 50 mg QHS.   1/28- slept better- con't regimen     LOS: 9 days A FACE TO FACE EVALUATION WAS PERFORMED  Phil Michels 08/23/2020, 8:35 AM

## 2020-08-23 NOTE — Progress Notes (Signed)
Physical Therapy Session Note  Patient Details  Name: Keith Downs MRN: [id number] Date of Birth: [birth year]  Today's Date: 08/23/2020 PT Individual Time: 0900-1000 and 1305-1405 PT Individual Time Calculation (min): 60 min and 60 min  Short Term Goals: Week 1:  PT Short Term Goal 1 (Week 1): Pt will compete bed mobility with minA and no bed features PT Short Term Goal 1 - Progress (Week 1): Met PT Short Term Goal 2 (Week 1): Pt will complete bed<>chair transfers with minA and LRAD PT Short Term Goal 2 - Progress (Week 1): Met PT Short Term Goal 3 (Week 1): Pt will ambulate 49ft with modA and LRAD PT Short Term Goal 3 - Progress (Week 1): Progressing toward goal  Skilled Therapeutic Interventions/Progress Updates: Tx1: Pt presented in recliner agreeable to therapy. Pt denies pain throughout session. Performed ambulatory transfer to w/c with RW and minA. Pt propelled to rehab gym with supervision and participated in side stepping in parallel bars for NMR and coordination 64ft x 6. Pt required max verbal cues for attempting to maintain LLE in neutral vs ER and clearing LLE when abducting. Pt transported to stairs and performed AA hip flexion progressing to AROM to 3in step x 10, pt did require assist for extension returning LLE to floor. Participated in gait training 22ft with minA but required several standing rest breaks for repositioning and rest breaks. Pt demonstrated improved technique with knee flexion to assist with swing through and decreased compensatory strategies from hip (hip hiking). Participated in standing balance activities bouncing ball to ground to rehab tech. Pt performed 20 x 2 using BUE to bounce ball. Pt with x 1 mild LOB posteriorly requiring minA from PTA for correction. Performed ambulatory transfer back to w/c and transported back to room. Performed stand pivot back to recliner CGA and pt left in recliner at end of session with seat alarm on, call bell within reach  and needs met.   Tx2: Pt presented in recliner agreeable, denies pain during session. Performed ambulatory transfer to w/c with RW and minA. Pt propelled to rehab gym and participated in hip flexion to 3in step x 10 with pt able to consistently reach step and requiring minA to return to floor. Pt then propelled to ortho gym and performed stand pivot to mat turning to R with CGA. Pt transferred to supine with CGA and rolled to sidelying with minA. With use of powder board pt participated in gravity eliminated hip flexion 2 x 10, AA hamstring curls 2 x 10, AA hip extension 2 x 10, and clamshells 2 x 10 with pt able to perform in small range. Pt then rolled to L and performed sidelying to sitting with CGA and increased time. Pt ambulated ~5ft to NuStep with minA due to increased difficulty advancing LLE and transferred to NuStep. Participated in NuStep L3 x 10 min with noted improvement in maintaining midline vs hip IR since performed last with this therapist. Pt then performed stand pivot with RW to w/c minA to L and propelled back to room. Performed stand pivot w/c to recliner to R with CGA and pt left in recliner at end of session with seat alarm on, call bell within reach and needs met.       Therapy Documentation Precautions:  Precautions Precautions: Cervical,Fall Required Braces or Orthoses: Cervical Brace Cervical Brace: Hard collar Restrictions Weight Bearing Restrictions: No Other Position/Activity Restrictions: Hard collar when OOB, may apply sitting EOB General:   Vital Signs: Therapy Vitals Temp:  97.8 F (36.6 C) Temp Source: Oral Pulse Rate: 66 Resp: 19 BP: 111/83 Patient Position (if appropriate): Sitting Oxygen Therapy SpO2: 100 % Pain:   Mobility:   Locomotion :    Trunk/Postural Assessment :    Balance:   Exercises:   Other Treatments:      Therapy/Group: Individual Therapy  Valora Norell 08/23/2020, 4:27 PM

## 2020-08-23 NOTE — Progress Notes (Signed)
Pt had bm this afternoon, requested to wait on suppository to see if has another BM. Moved suppository to tomorrow per pt request

## 2020-08-24 NOTE — Progress Notes (Signed)
[facility] PHYSICAL MEDICINE & REHABILITATION PROGRESS NOTE   Subjective/Complaints:        ROS:  Pt denies SOB, abd pain, CP, N/V/C/D, and vision changes  Objective:   No results found. Recent Labs    08/22/20 0511  WBC 11.4*  HGB 13.1  HCT 37.7*  PLT 185   Recent Labs    08/22/20 0511  NA 131*  K 4.8  CL 99  CO2 24  GLUCOSE 133*  BUN 18  CREATININE 0.83  CALCIUM 8.5*    Intake/Output Summary (Last 24 hours) at 08/24/2020 1307 Last data filed at 08/24/2020 1151 Gross per 24 hour  Intake 460 ml  Output 1700 ml  Net -1240 ml        Physical Exam: Vital Signs Blood pressure 111/65, pulse (!) 59, temperature 97.7 F (36.5 C), resp. rate 17, height 5\' 11"  (1.803 m), SpO2 100 %.   General: No acute distress Mood and affect are appropriate Heart: Regular rate and rhythm no rubs murmurs or extra sounds Lungs: Clear to auscultation, breathing unlabored, no rales or wheezes Abdomen: Positive bowel sounds, soft nontender to palpation, nondistended Extremities: No clubbing, cyanosis, or edema Skin: No evidence of breakdown, no evidence of rash  Neurological: better oriented - Ox3 LLE MAS of 3-  Motor: RUE: 5/5 proximal distal LUE: 4 -/5 proximal to distal RLE: 4 --4/5 proximal distal LLE: 3+-4 -/5 hip flexion, knee extension, 2/5 ankle dorsiflexion Sensation diminished light touch LUE/LLE--- no change       Assessment/Plan: 1. Functional deficits which require 3+ hours per day of interdisciplinary therapy in a comprehensive inpatient rehab setting.  Physiatrist is providing close team supervision and 24 hour management of active medical problems listed below.  Physiatrist and rehab team continue to assess barriers to discharge/monitor patient progress toward functional and medical goals  Care Tool:  Bathing    Body parts bathed by patient: Right arm,Left arm,Chest,Abdomen,Front perineal area,Right upper leg,Left upper leg,Right lower leg,Left  lower leg,Face   Body parts bathed by helper: Buttocks     Bathing assist Assist Level: Minimal Assistance - Patient > 75%     Upper Body Dressing/Undressing Upper body dressing   What is the patient wearing?: Pull over shirt,Orthosis    Upper body assist Assist Level: Minimal Assistance - Patient > 75%    Lower Body Dressing/Undressing Lower body dressing      What is the patient wearing?: Pants     Lower body assist Assist for lower body dressing: Contact Guard/Touching assist     Toileting Toileting    Toileting assist Assist for toileting: Moderate Assistance - Patient 50 - 74%     Transfers Chair/bed transfer  Transfers assist     Chair/bed transfer assist level: Minimal Assistance - Patient > 75%     Locomotion Ambulation   Ambulation assist      Assist level: Minimal Assistance - Patient > 75% Assistive device: Walker-rolling Max distance: 6'   Walk 10 feet activity   Assist     Assist level: Minimal Assistance - Patient > 75% Assistive device: Walker-rolling   Walk 50 feet activity   Assist Walk 50 feet with 2 turns activity did not occur: Safety/medical concerns         Walk 150 feet activity   Assist Walk 150 feet activity did not occur: Safety/medical concerns         Walk 10 feet on uneven surface  activity   Assist Walk 10 feet on uneven  surfaces activity did not occur: Safety/medical concerns         Wheelchair     Assist Will patient use wheelchair at discharge?: No Type of Wheelchair: Manual    Wheelchair assist level: Supervision/Verbal cueing Max wheelchair distance: 150    Wheelchair 50 feet with 2 turns activity    Assist        Assist Level: Supervision/Verbal cueing   Wheelchair 150 feet activity     Assist      Assist Level: Supervision/Verbal cueing   Blood pressure 111/65, pulse (!) 59, temperature 97.7 F (36.5 C), resp. rate 17, height 5\' 11"  (1.803 m), SpO2 100  %.  Medical Problem List and Plan: 1.  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             -ELOS/Goals: 14-18 days/supervision/min a  1/22- MAY shower- just cover incision  1/24: plan to wean decadron?  1/25- decrease decadron to 2 mg BID x 3 days then QHS x3 days then stop              -Continue CIR therapies including PT, OT  Improved 2.  Antithrombotics: -DVT/anticoagulation:  Mechanical: Sequential compression devices, below knee Bilateral lower extremities  1/22- started lovenox             -antiplatelet therapy: N/a 3. Pain Management: On oxycodone prn.    1/26- pain controlled- spasticity a little better as below- con't regimen  1/27- muscle tightness in naterior neck bothersome- muscle relaxer helps- con't regimen  1/28- reports mucle relaxer is helping neck tightness some- con't regimen             Monitor with increased exertion, particularly for neuropathic pain 4. Mood: LCSW to follow for evaluation and support.              -antipsychotic agents: N/A 5. Neuropsych: This patient is capable of making decisions on his own behalf. 6. Skin/Wound Care: Routine pressure relief measures 7. Fluids/Electrolytes/Nutrition: Monitor I/Os             1/24. Hyponatremia of 126 (136 08/15/20), normal BUN/Cr/K+   -hold HCTZ (already received today)   -recheck bmet tomorrow  1/25- Na down to 124 today- off HCTZ- since BUN also up to 26- will give NS IVFs 75cc hr x 1 days and BP also somewhat low- could be worse due to steroids which I'm tapering.   1/26- pt also admits has been drinking a LOT due to dry mouth- suggested gum/ice/hard candy to help dry mouth since drinking too much  1/27- Na up to 131 and BUN down to 18- doing much better- will recheck in AM since off IVFs  1/28- labs Monday with improvement in labs- if Symptomatic, recheck BMP 8. HTN: Monitor BP tid--continue HCTZ and Avapro. Monitor  for orthostasis.    Vitals:   08/24/20 0501 08/24/20 0740  BP: 111/65   Pulse: (!) 59   Resp: 17   Temp: 97.7 F (36.5 C)   SpO2: 100% 100%  Controlled on Avapro, off hydrochlorothiazide Flomax and tizanidine may be contributing to lower BPs, no dizziness or other symptoms 9. GERD: Resume PPI.   1/22- increased to BID per pt request from Sx's.  10. Hypothyroid: Continue supplement 11. COPD: Encourage pulmonary hygiene with IS and monitor respiratory status with increase in activity. Continue Dulera with albuterol prn SOB. .  12. Spasticity: Baclofen tid-->may need titration upwards to help  manage symptoms.   1/20- will increase Baclofen to 10 mg QID and then add prn Zanaflex 4 mg TID prn for increasing spasms, esp at night- explained can cause sedation/constipaiton. Of note, spasticity worse at night.   1/21- forgot to take zanaflex- will write to offer it- slept better with less cramping- will increase Baclofen Sat/Sunday  1/22- forgetting Zanaflex- will scheduled 4 mg QHS as well as baclofen  1/23-24- spasticity improving- once discharged, can possibly arrange for Botox toxin after d/c. Titrate tizanidine further as needed  1/28- spasticity slightly better in LLE- will hold off making changes today, but if need be, can increase Baclofen OR Zanaflex over the weekend.  13. Leukocytosis: likely steroids  1/20- WBC 13.4- but on decadron- will check U/A and Cx to make sure doesn't have UTI.    1/21- U/A (-)- is from decadron  1/22- questionable dysuria last night- pt denied?  1/23- pt denies dysuria today and hematuria- but sounds like he had yesterday based on his language now.  1/26- weaning Steroids to help with leukocytosis.  1/27- WBC 11.2- doing better with decrease in steroids  14. Frequent urination  1/21- hx of prostate enlargement- will start Flomax -.4 mg q supper- since sounds like not emptying.  1/23- peeing ~350cc at a time- so less often- con't flomax  1/27- Also on  finesteride 5mg  QHS- per Urology- is voiding/emptying better per pt, but still has urgency.   1/28- urgency better- con't regimen 15. Hematuria  1/25- documented in chart x 2- U/A on 1/20 was (-) for hematuria/blood in urine- will call Urology to see what the next step would be.   1/27- On finesteride- no more hematuria so far- con't to monitor 16. Hyponatremia  1/25- down to 124- will give IVFs NS 75cc/hr - could have been due to HCTZ? Which has stopped or Decadron, which I'm tapering- will monitor.   1/26- will check CBC and BMP in AM- suggest not drinking so much so can improve Na and as wean steroids, should also help.   1/27- Na up to 131 and BUN odwn to 18- off IVFs- con't regimen 17. Constipation  1/27- will order sorbitol x1 at 1400  1/28- will give another dose of sorbitol this AM and schedule suppository ~ 1500 after therapy to get him t go- also added Miralax daily for bowels.  18. Insomnia  1/27- will schedule trazodone 50 mg QHS.   1/28- slept better- con't regimen 1/29 the patient states that it is difficult to sleep because of noise in the room, taking a nap this afternoon   LOS: 10 days A FACE TO FACE EVALUATION WAS PERFORMED  2/29 08/24/2020, 1:07 PM

## 2020-08-25 NOTE — Progress Notes (Signed)
PHYSICAL MEDICINE & REHABILITATION PROGRESS NOTE   Subjective/Complaints:  No issues overnite, pt without pain c/os, no BM yet today but feels one coming on [facility] well with PT today   ROS:  Pt denies SOB, abd pain, CP, N/V/C/D, and vision changes  Objective:   No results found. No results for input(s): WBC, HGB, HCT, PLT in the last 72 hours. No results for input(s): NA, K, CL, CO2, GLUCOSE, BUN, CREATININE, CALCIUM in the last 72 hours.  Intake/Output Summary (Last 24 hours) at 08/25/2020 1115 Last data filed at 08/25/2020 0700 Gross per 24 hour  Intake 800 ml  Output 800 ml  Net 0 ml        Physical Exam: Vital Signs Blood pressure 110/67, pulse 69, temperature 98 F (36.7 C), resp. rate 17, height 5\' 11"  (1.803 m), SpO2 100 %.   General: No acute distress Mood and affect are appropriate Heart: Regular rate and rhythm no rubs murmurs or extra sounds Lungs: Clear to auscultation, breathing unlabored, no rales or wheezes Abdomen: Positive bowel sounds, soft nontender to palpation, nondistended Extremities: No clubbing, cyanosis, or edema Skin: No evidence of breakdown, no evidence of rash  Neurological: better oriented - Ox3 LLE MAS of 3-  Motor: RUE: 5/5 proximal distal LUE: 4 -/5 proximal to distal RLE: 4 --4/5 proximal distal LLE: 3+-4 -/5 hip flexion, knee extension, 2/5 ankle dorsiflexion Sensation diminished light touch LUE/LLE--- no change       Assessment/Plan: 1. Functional deficits which require 3+ hours per day of interdisciplinary therapy in a comprehensive inpatient rehab setting.  Physiatrist is providing close team supervision and 24 hour management of active medical problems listed below.  Physiatrist and rehab team continue to assess barriers to discharge/monitor patient progress toward functional and medical goals  Care Tool:  Bathing    Body parts bathed by patient: Right arm,Left arm,Chest,Abdomen,Front perineal area,Right  upper leg,Left upper leg,Right lower leg,Left lower leg,Face   Body parts bathed by helper: Buttocks     Bathing assist Assist Level: Minimal Assistance - Patient > 75%     Upper Body Dressing/Undressing Upper body dressing   What is the patient wearing?: Pull over shirt,Orthosis    Upper body assist Assist Level: Minimal Assistance - Patient > 75%    Lower Body Dressing/Undressing Lower body dressing      What is the patient wearing?: Pants     Lower body assist Assist for lower body dressing: Contact Guard/Touching assist     Toileting Toileting    Toileting assist Assist for toileting: Moderate Assistance - Patient 50 - 74%     Transfers Chair/bed transfer  Transfers assist     Chair/bed transfer assist level: Minimal Assistance - Patient > 75%     Locomotion Ambulation   Ambulation assist      Assist level: Minimal Assistance - Patient > 75% Assistive device: Walker-rolling Max distance: 6'   Walk 10 feet activity   Assist     Assist level: Minimal Assistance - Patient > 75% Assistive device: Walker-rolling   Walk 50 feet activity   Assist Walk 50 feet with 2 turns activity did not occur: Safety/medical concerns         Walk 150 feet activity   Assist Walk 150 feet activity did not occur: Safety/medical concerns         Walk 10 feet on uneven surface  activity   Assist Walk 10 feet on uneven surfaces activity did not occur: Safety/medical concerns  Wheelchair     Assist Will patient use wheelchair at discharge?: No Type of Wheelchair: Manual    Wheelchair assist level: Supervision/Verbal cueing Max wheelchair distance: 150    Wheelchair 50 feet with 2 turns activity    Assist        Assist Level: Supervision/Verbal cueing   Wheelchair 150 feet activity     Assist      Assist Level: Supervision/Verbal cueing   Blood pressure 110/67, pulse 69, temperature 98 F (36.7 C), resp. rate 17,  height 5\' 11"  (1.803 m), SpO2 100 %.  Medical Problem List and Plan: 1.  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             -ELOS/Goals: 14-18 days/supervision/min a  1/22- MAY shower- just cover incision  1/24: plan to wean decadron?  1/25- decrease decadron to 2 mg BID x 3 days then QHS x3 days then stop              -Continue CIR therapies including PT, OT  Improved 2.  Antithrombotics: -DVT/anticoagulation:  Mechanical: Sequential compression devices, below knee Bilateral lower extremities  1/22- started lovenox             -antiplatelet therapy: N/a 3. Pain Management: On oxycodone prn.    1/26- pain controlled- spasticity a little better as below- con't regimen  1/27- muscle tightness in naterior neck bothersome- muscle relaxer helps- con't regimen  1/28- reports mucle relaxer is helping neck tightness some- con't regimen             Monitor with increased exertion, particularly for neuropathic pain 4. Mood: LCSW to follow for evaluation and support.              -antipsychotic agents: N/A 5. Neuropsych: This patient is capable of making decisions on his own behalf. 6. Skin/Wound Care: Routine pressure relief measures 7. Fluids/Electrolytes/Nutrition: Monitor I/Os             1/24. Hyponatremia of 126 (136 08/15/20), normal BUN/Cr/K+   -hold HCTZ (already received today)   -recheck bmet tomorrow  1/25- Na down to 124 today- off HCTZ- since BUN also up to 26- will give NS IVFs 75cc hr x 1 days and BP also somewhat low- could be worse due to steroids which I'm tapering.   1/26- pt also admits has been drinking a LOT due to dry mouth- suggested gum/ice/hard candy to help dry mouth since drinking too much  1/27- Na up to 131 and BUN down to 18- doing much better- will recheck in AM since off IVFs  1/28- labs Monday with improvement in labs- if Symptomatic, recheck BMP 8. HTN: Monitor BP  tid--continue HCTZ and Avapro. Monitor for orthostasis.    Vitals:   08/24/20 1916 08/25/20 0833  BP: 110/67   Pulse: 69   Resp: 17   Temp: 98 F (36.7 C)   SpO2: 100% 100%  Controlled on Avapro, off hydrochlorothiazide Flomax and tizanidine may be contributing to lower BPs, no dizziness or other symptoms 9. GERD: Resume PPI.   1/22- increased to BID per pt request from Sx's.  10. Hypothyroid: Continue supplement 11. COPD: Encourage pulmonary hygiene with IS and monitor respiratory status with increase in activity. Continue Dulera with albuterol prn SOB. .  12. Spasticity: Baclofen tid-->may need titration upwards to help manage symptoms.   1/20- will increase Baclofen to 10 mg QID and then add prn Zanaflex  4 mg TID prn for increasing spasms, esp at night- explained can cause sedation/constipaiton. Of note, spasticity worse at night.   1/21- forgot to take zanaflex- will write to offer it- slept better with less cramping- will increase Baclofen Sat/Sunday  1/22- forgetting Zanaflex- will scheduled 4 mg QHS as well as baclofen  1/23-24- spasticity improving- once discharged, can possibly arrange for Botox toxin after d/c. Titrate tizanidine further as needed  1/28- spasticity slightly better in LLE- will hold off making changes today, but if need be, can increase Baclofen OR Zanaflex over the weekend.  13. Leukocytosis: likely steroids  1/20- WBC 13.4- but on decadron- will check U/A and Cx to make sure doesn't have UTI.    1/21- U/A (-)- is from decadron  1/22- questionable dysuria last night- pt denied?  1/23- pt denies dysuria today and hematuria- but sounds like he had yesterday based on his language now.  1/26- weaning Steroids to help with leukocytosis.  1/27- WBC 11.2- doing better with decrease in steroids  14. Frequent urination  1/21- hx of prostate enlargement- will start Flomax -.4 mg q supper- since sounds like not emptying.  1/23- peeing ~350cc at a time- so less often-  con't flomax  1/27- Also on finesteride 5mg  QHS- per Urology- is voiding/emptying better per pt, but still has urgency.   1/28- urgency better- con't regimen 15. Hematuria  1/25- documented in chart x 2- U/A on 1/20 was (-) for hematuria/blood in urine- will call Urology to see what the next step would be.   1/27- On finesteride- no more hematuria so far- con't to monitor 16. Hyponatremia  1/25- down to 124- will give IVFs NS 75cc/hr - could have been due to HCTZ? Which has stopped or Decadron, which I'm tapering- will monitor.   1/26- will check CBC and BMP in AM- suggest not drinking so much so can improve Na and as wean steroids, should also help.   1/27- Na up to 131 and BUN odwn to 18- off IVFs- con't regimen 17. Constipation  1/27- will order sorbitol x1 at 1400  1/28- will give another dose of sorbitol this AM and schedule suppository ~ 1500 after therapy to get him t go- also added Miralax daily for bowels.  18. Insomnia  1/27- will schedule trazodone 50 mg QHS.   1/30 slept well last noc   LOS: 11 days A FACE TO FACE EVALUATION WAS PERFORMED  2/30 08/25/2020, 11:15 AM

## 2020-08-25 NOTE — Progress Notes (Signed)
Occupational Therapy Session Note  Patient Details  Name: Keith Downs MRN: [id number] Date of Birth: Jul 03, 1961  Today's Date: 08/25/2020 OT Individual Time: 1300-1340 OT Individual Time Calculation (min): 40 min    Short Term Goals: Week 2:  OT Short Term Goal 1 (Week 2): STG=LTG secondary to ELOS  Skilled Therapeutic Interventions/Progress Updates:    Pt resting in recliner upon arrival with wife present. Pt's wife observed OT session and is scheduled for another education session on 2/2. Pt practiced TTB transfers, bed transfers, and furniture transfers. TTB with CGA. Bed transfers with min A for BLE management when sit>supine. Squat pivot transfer to sofa with CGA. Pt required min a for sit>stand from sofa. Pt propelled w/c from room<>tub room/ADL apartment with supervsion. Pt and wife independent with w/c set up. Pt returned to room and amb with RW 5' to recliner. Pt remained in recliner with all needs within reach. Wife present.   Therapy Documentation Precautions:  Precautions Precautions: Cervical,Fall Required Braces or Orthoses: Cervical Brace Cervical Brace: Hard collar Restrictions Weight Bearing Restrictions: No Other Position/Activity Restrictions: Hard collar when OOB, may apply sitting EOB   Pain:  Pt denies pain this afternoon   Therapy/Group: Individual Therapy  Rich Brave 08/25/2020, 1:42 PM

## 2020-08-25 NOTE — Plan of Care (Signed)
  Problem: RH Balance Goal: LTG Patient will maintain dynamic standing balance (PT) Description: LTG:  Patient will maintain dynamic standing balance with assistance during mobility activities (PT) Flowsheets (Taken 08/25/2020 1748) LTG: Pt will maintain dynamic standing balance during mobility activities with:: (downgraded based on progress and ELOS) Contact Guard/Touching assist Note: downgraded based on progress and ELOS   Problem: RH Ambulation Goal: LTG Patient will ambulate in controlled environment (PT) Description: LTG: Patient will ambulate in a controlled environment, # of feet with assistance (PT). Flowsheets (Taken 08/25/2020 1750) LTG: Pt will ambulate in controlled environ  assist needed:: (downgraded based on progress and ELOS) Contact Guard/Touching assist LTG: Ambulation distance in controlled environment: 41ft using LRAD Note: downgraded based on progress and ELOS Goal: LTG Patient will ambulate in home environment (PT) Description: LTG: Patient will ambulate in home environment, # of feet with assistance (PT). Flowsheets (Taken 08/25/2020 1750) LTG: Pt will ambulate in home environ  assist needed:: (downgraded based on progress and ELOS) Contact Guard/Touching assist LTG: Ambulation distance in home environment: 79ft using LRAD Note: downgraded based on progress and ELOS

## 2020-08-25 NOTE — Progress Notes (Signed)
Physical Therapy Session Note  Patient Details  Name: Keith Downs MRN: [id number] Date of Birth: 1961-05-15  Today's Date: 08/25/2020 PT Individual Time: 0950-1105 PT Individual Time Calculation (min): 75 min   Short Term Goals: Week 2:  PT Short Term Goal 1 (Week 2): = to LTGs based on ELOS  Skilled Therapeutic Interventions/Progress Updates:    Pt received sitting in recliner with his wife, Kendal Hymen, present and pt agreeable to therapy session. Session focused on starting hands-on education and training with pt's wife in preparation for upcoming D/C. Pt wearing cervical collar and able to state precautions without cuing. Therapist noted pt's AFO in his shoe does not have a shoe sole on top to protect his skin from the brace - therapist extensively educated pt/wife on importance of daily skin assessments and requested wife bring in a different pair of tennis shoes that had more depth and shoe strings to allow the sole to rest on top of the brace and to allow increased ease of getting shoe on/off. Therapist notified [employer] rep of likely need for a 2nd toe cap on the new shoes and updated primary PT. Therapist educated on proper use of gait belt and positioning when assisting pt with mobility. R stand pivot recliner>w/c using RW with CGA for steadying while coming to stand - slow transition but able to lift up without assist - CGA steadying while turning.  Transported to/from gym in w/c for time management and energy conservation.   Gait training ~45ft using RW, CGA progressed to min assist for balance towards end with cuing for increased L LE foot clearance and step length as pt demos lack of hip/knee flexion - cuing for increased upright trunk posture with hip extension to facilitate increased R/L weight shift onto stance limb to fully off weight LLE during swing - with fatigue progressed towards more of a step-to pattern leading with R LE - when turning to sit in w/c requires min assist for  balance due to lack of sufficient L LE step length, cuing and education on increased attention to this due to impaired proprioception - throughout therapist educating pt's wife on proper positioning to safely assist pt.   Pt ambulated ~62ft using RW with his wife assisting and therapist providing cuing to pt on gait mechanics and pt's wife on proper positioning  - therapist also providing close supervision for safety - pt demos increased L LE step length achieving reciprocal stepping pattern this gait trial.  Ascended/descended 4 steps (6" height) using B HRs 2x (seated break between) with min assist - step to pattern leading with R LE on ascend and L LE on descent - therapist facilitating increased L LE hip/knee flexion to step up onto step to decreased hip hiking and circumduction compensation (able to get foot up without hands-on assist but using compensations) - facilitating increased L LE hip abduction on descent to improve foot placement for increased balance and wider BOS, guarding L knee during descent but no buckling occurred.  Pt reports need to use bathroom - transported to/from room in w/c and used urinal in sitting with set-up assist.  Discussed car transfer with pt's wife reporting their daughter has a lower Nissan Sentra that they can use. Simulated stand pivot car transfer to/from w/c using RW with pt's wife providing hands-on assistance - when turning L to get into car pt having difficulty stepping postero-laterally with L LE to complete transfer due to fatigue therefore therapist cued pt's wife to use her foot to  assist with repositioning pt's LE - discussed how pt will be fatigued after OPPT sessions likely needing increased assistance to get in/out of the vehicle. Did not have pt place LEs in/out of car at this time to conserve energy. Transported back to room. L stand pivot w/c>recliner using RW with pt's wife providing assistance with min cuing from therapist for proper technique. Pt  left seated in recliner with needs in reach.   Therapy Documentation Precautions:  Precautions Precautions: Cervical,Fall Required Braces or Orthoses: Cervical Brace Cervical Brace: Hard collar Restrictions Weight Bearing Restrictions: No Other Position/Activity Restrictions: Hard collar when OOB, may apply sitting EOB  Pain: No reports of pain throughout session.   Therapy/Group: Individual Therapy  Ginny Forth , PT, DPT, CSRS  08/25/2020, 8:15 AM

## 2020-08-26 LAB — BASIC METABOLIC PANEL
Anion gap: 9 (ref 5–15)
BUN: 17 mg/dL (ref 6–20)
CO2: 25 mmol/L (ref 22–32)
Calcium: 8.6 mg/dL — ABNORMAL LOW (ref 8.9–10.3)
Chloride: 98 mmol/L (ref 98–111)
Creatinine, Ser: 0.73 mg/dL (ref 0.61–1.24)
GFR, Estimated: >60 mL/min (ref >60)
Glucose, Bld: 114 mg/dL — ABNORMAL HIGH (ref 70–99)
Potassium: 4.5 mmol/L (ref 3.5–5.1)
Sodium: 132 mmol/L — ABNORMAL LOW (ref 135–145)

## 2020-08-26 LAB — CBC
HCT: 38.5 % — ABNORMAL LOW (ref 39.0–52.0)
Hemoglobin: 13 g/dL (ref 13.0–17.0)
MCH: 30.3 pg (ref 26.0–34.0)
MCHC: 33.8 g/dL (ref 30.0–36.0)
MCV: 89.7 fL (ref 80.0–100.0)
Platelets: 160 10*3/uL (ref 150–400)
RBC: 4.29 MIL/uL (ref 4.22–5.81)
RDW: 12.6 % (ref 11.5–15.5)
WBC: 9.7 10*3/uL (ref 4.0–10.5)
nRBC: 0 % (ref 0.0–0.2)

## 2020-08-26 NOTE — Progress Notes (Signed)
Occupational Therapy Session Note  Patient Details  Name: Keith Downs MRN: [id number] Date of Birth: [birth year]  Today's Date: 08/26/2020 OT Individual Time: 0700-0810 OT Individual Time Calculation (min): 70 min    Short Term Goals: Week 1:  OT Short Term Goal 1 (Week 1): Pt will demonstrate improved balance to be able to stand with RW and release 1 hand to pull pants up with min A. OT Short Term Goal 1 - Progress (Week 1): Met OT Short Term Goal 2 (Week 1): Pt will be able to complete squat pivot to toilet with mod A of 1. OT Short Term Goal 2 - Progress (Week 1): Met OT Short Term Goal 3 (Week 1): Pt will be able to don shirt over head with set up. OT Short Term Goal 3 - Progress (Week 1): Met OT Short Term Goal 4 (Week 1): Pt will be able to don pants over feet with min A with AE as needed. OT Short Term Goal 4 - Progress (Week 1): Met  Skilled Therapeutic Interventions/Progress Updates:    Pt resting in recliner upon arrival and requesting to take a shower. Squat pivot transfers recliner>w/c<>TTB. Pt using LUE to assist with bathing tasks and reaching to buttocks when standing. Sit<>stand in shower using grab bar with CGA. Pt completed bathing at min A/CGA level. Pt able to thread pants without assistance. Pt pulled pants over hips with CGA for standing balance. Pt able to don R shoe but requires assistance with Lt shoe/AFP. Pt's wife to bring in new shoes 1/2 size larger. Pt amb with RW to sink to sit in w/c to complete grooming tasks. Education planned for Wednesday (2/2) at 10 AM. Pt remained in recliner with all needs within reach and seat alarm activated.   Therapy Documentation Precautions:  Precautions Precautions: Cervical,Fall Required Braces or Orthoses: Cervical Brace Cervical Brace: Hard collar Restrictions Weight Bearing Restrictions: No Other Position/Activity Restrictions: Hard collar when OOB, may apply sitting EOB Pain:  Pt denies pain this  morning   Therapy/Group: Individual Therapy  [name] 08/26/2020, 8:11 AM

## 2020-08-26 NOTE — Progress Notes (Signed)
Occupational Therapy Session Note  Patient Details  Name: Gurshan Settlemire MRN: [id number] Date of Birth: 1961-05-28  Today's Date: 08/26/2020 OT Individual Time: 1300-1345 OT Individual Time Calculation (min): 45 min    Short Term Goals: Week 2:  OT Short Term Goal 1 (Week 2): STG=LTG secondary to ELOS  Skilled Therapeutic Interventions/Progress Updates:    Pt resting in recliner upon arrival. OT intervention with focus on functional transfers, functional amb with RW, standing balance, LUE/hand functional use, discharge planning, and safety awareness to increase independence with BADLs. Stand pivot tranfsers with RW-CGA. Functional amb 5' with CGA. Sit<>stand with RW-CGA. Standing balance to toss bean bags with CGA/min A for LOBx3. Pt retrieved bean bags with Lt hand and transferred to Rt hand to toss. Pt completed tasks with no UE support and min A for LOBx3. Pt also engaged in theraputty tasks per handout. Pt returned to recliner.  All needs within reach, BLE elevated, seat alarm activated.   Therapy Documentation Precautions:  Precautions Precautions: Cervical,Fall Required Braces or Orthoses: Cervical Brace Cervical Brace: Hard collar Restrictions Weight Bearing Restrictions: No Other Position/Activity Restrictions: Hard collar when OOB, may apply sitting EOB Pain:  Pt denies pain this afternoom  Therapy/Group: Individual Therapy  Rich Brave 08/26/2020, 2:47 PM

## 2020-08-26 NOTE — Progress Notes (Signed)
[facility] PHYSICAL MEDICINE & REHABILITATION PROGRESS NOTE   Subjective/Complaints:  Feels like tone in LLE doing better-  Also getting TEDs in place, since feet feeling puffy/swollen.  Also notes prostate is doing better- waking up a lot less to void- so meds finally kicking in.   ROS:   Pt denies SOB, abd pain, CP, N/V/C/D, and vision changes  Objective:   No results found. Recent Labs    08/26/20 0626  WBC 9.7  HGB 13.0  HCT 38.5*  PLT 160   Recent Labs    08/26/20 0626  NA 132*  K 4.5  CL 98  CO2 25  GLUCOSE 114*  BUN 17  CREATININE 0.73  CALCIUM 8.6*    Intake/Output Summary (Last 24 hours) at 08/26/2020 1011 Last data filed at 08/26/2020 0902 Gross per 24 hour  Intake 760 ml  Output 1850 ml  Net -1090 ml        Physical Exam: Vital Signs Blood pressure 109/62, pulse 63, temperature 98 F (36.7 C), resp. rate 15, height 5\' 11"  (1.803 m), SpO2 98 %.   General: sitting up in bedside chair, OT in room, appropriate, NAD Mood and affect are appropriate- bright Heart: RRR- no JVD Lungs: CTA B/L- no W/R/R- good air movement Abdomen: Soft, NT, ND, (+)BS  Extremities: No clubbing, cyanosis, or edema Skin: No evidence of breakdown, no evidence of rash IV L hand Neurological: better oriented - Ox3 LLE MAS of 2- much improved ROM/spasticity  Motor: RUE: 5/5 proximal distal LUE: 4 -/5 proximal to distal RLE: 4 --4/5 proximal distal LLE: 3+-4 -/5 hip flexion, knee extension, 2/5 ankle dorsiflexion Sensation diminished light touch LUE/LLE--- no change       Assessment/Plan: 1. Functional deficits which require 3+ hours per day of interdisciplinary therapy in a comprehensive inpatient rehab setting.  Physiatrist is providing close team supervision and 24 hour management of active medical problems listed below.  Physiatrist and rehab team continue to assess barriers to discharge/monitor patient progress toward functional and medical goals  Care  Tool:  Bathing    Body parts bathed by patient: Right arm,Left arm,Chest,Abdomen,Front perineal area,Right upper leg,Left upper leg,Right lower leg,Left lower leg,Face,Buttocks   Body parts bathed by helper: Buttocks     Bathing assist Assist Level: Contact Guard/Touching assist     Upper Body Dressing/Undressing Upper body dressing   What is the patient wearing?: Pull over shirt,Orthosis    Upper body assist Assist Level: Supervision/Verbal cueing    Lower Body Dressing/Undressing Lower body dressing      What is the patient wearing?: Pants     Lower body assist Assist for lower body dressing: Contact Guard/Touching assist     Toileting Toileting    Toileting assist Assist for toileting: Moderate Assistance - Patient 50 - 74%     Transfers Chair/bed transfer  Transfers assist     Chair/bed transfer assist level: Minimal Assistance - Patient > 75% Chair/bed transfer assistive device:   Ambulation assist      Assist level: Minimal Assistance - Patient > 75% Assistive device: Walker-rolling Max distance: 51ft   Walk 10 feet activity   Assist     Assist level: Minimal Assistance - Patient > 75% Assistive device: Walker-rolling   Walk 50 feet activity   Assist Walk 50 feet with 2 turns activity did not occur: Safety/medical concerns  Assist level: Minimal Assistance - Patient > 75% Assistive device: Walker-rolling    Walk 150 feet activity  Assist Walk 150 feet activity did not occur: Safety/medical concerns         Walk 10 feet on uneven surface  activity   Assist Walk 10 feet on uneven surfaces activity did not occur: Safety/medical concerns         Wheelchair     Assist Will patient use wheelchair at discharge?: No Type of Wheelchair: Manual    Wheelchair assist level: Supervision/Verbal cueing Max wheelchair distance: 150    Wheelchair 50 feet with 2 turns  activity    Assist        Assist Level: Supervision/Verbal cueing   Wheelchair 150 feet activity     Assist      Assist Level: Supervision/Verbal cueing   Blood pressure 109/62, pulse 63, temperature 98 F (36.7 C), resp. rate 15, height 5\' 11"  (1.803 m), SpO2 98 %.  Medical Problem List and Plan: 1.  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             -ELOS/Goals: 14-18 days/supervision/min a  1/22- MAY shower- just cover incision  1/24: plan to wean decadron?  1/25- decrease decadron to 2 mg BID x 3 days then QHS x3 days then stop              -Continue CIR therapies including PT, OT  Improved 2.  Antithrombotics: -DVT/anticoagulation:  Mechanical: Sequential compression devices, below knee Bilateral lower extremities  1/22- started lovenox             -antiplatelet therapy: N/a 3. Pain Management: On oxycodone prn.    1/26- pain controlled- spasticity a little better as below- con't regimen  1/27- muscle tightness in naterior neck bothersome- muscle relaxer helps- con't regimen  1/28- reports mucle relaxer is helping neck tightness some- con't regimen  1/31- pain controlled con't regimen             Monitor with increased exertion, particularly for neuropathic pain 4. Mood: LCSW to follow for evaluation and support.              -antipsychotic agents: N/A 5. Neuropsych: This patient is capable of making decisions on his own behalf. 6. Skin/Wound Care: Routine pressure relief measures 7. Fluids/Electrolytes/Nutrition: Monitor I/Os             1/24. Hyponatremia of 126 (136 08/15/20), normal BUN/Cr/K+   -hold HCTZ (already received today)   -recheck bmet tomorrow  1/25- Na down to 124 today- off HCTZ- since BUN also up to 26- will give NS IVFs 75cc hr x 1 days and BP also somewhat low- could be worse due to steroids which I'm tapering.   1/26- pt also admits has been drinking  a LOT due to dry mouth- suggested gum/ice/hard candy to help dry mouth since drinking too much  1/27- Na up to 131 and BUN down to 18- doing much better- will recheck in AM since off IVFs  1/28- labs Monday with improvement in labs- if Symptomatic, recheck BMP  1/31- Na up to 132- so can come off IVFs/IV- BUN down to 17- improved 8. HTN: Monitor BP tid--continue HCTZ and Avapro. Monitor for orthostasis.    Vitals:   08/26/20 0622 08/26/20 0814  BP: 109/62   Pulse: 63   Resp:    Temp:    SpO2: 100% 98%  Controlled on Avapro, off hydrochlorothiazide Flomax and tizanidine may be contributing to lower BPs, no dizziness or other symptoms 9.  GERD: Resume PPI.   1/22- increased to BID per pt request from Sx's.  10. Hypothyroid: Continue supplement 11. COPD: Encourage pulmonary hygiene with IS and monitor respiratory status with increase in activity. Continue Dulera with albuterol prn SOB. .  12. Spasticity: Baclofen tid-->may need titration upwards to help manage symptoms.   1/20- will increase Baclofen to 10 mg QID and then add prn Zanaflex 4 mg TID prn for increasing spasms, esp at night- explained can cause sedation/constipaiton. Of note, spasticity worse at night.   1/21- forgot to take zanaflex- will write to offer it- slept better with less cramping- will increase Baclofen Sat/Sunday  1/22- forgetting Zanaflex- will scheduled 4 mg QHS as well as baclofen  1/23-24- spasticity improving- once discharged, can possibly arrange for Botox toxin after d/c. Titrate tizanidine further as needed  1/28- spasticity slightly better in LLE- will hold off making changes today, but if need be, can increase Baclofen OR Zanaflex over the weekend.   1/31- Spasticity- MAS down to 2- much improved- con't regimen 13. Leukocytosis: likely steroids  1/20- WBC 13.4- but on decadron- will check U/A and Cx to make sure doesn't have UTI.    1/21- U/A (-)- is from decadron  1/22- questionable dysuria last night- pt  denied?  1/23- pt denies dysuria today and hematuria- but sounds like he had yesterday based on his language now.  1/26- weaning Steroids to help with leukocytosis.  1/27- WBC 11.2- doing better with decrease in steroids  14. Frequent urination  1/21- hx of prostate enlargement- will start Flomax -.4 mg q supper- since sounds like not emptying.  1/23- peeing ~350cc at a time- so less often- con't flomax  1/27- Also on finesteride 5mg  QHS- per Urology- is voiding/emptying better per pt, but still has urgency.   1/28- urgency better- con't regimen  1/31- urgency almost resolved- con't both meds 15. Hematuria  1/25- documented in chart x 2- U/A on 1/20 was (-) for hematuria/blood in urine- will call Urology to see what the next step would be.   1/27- On finesteride- no more hematuria so far- con't to monitor 16. Hyponatremia  1/25- down to 124- will give IVFs NS 75cc/hr - could have been due to HCTZ? Which has stopped or Decadron, which I'm tapering- will monitor.   1/26- will check CBC and BMP in AM- suggest not drinking so much so can improve Na and as wean steroids, should also help.   1/27- Na up to 131 and BUN odwn to 18- off IVFs- con't regimen 17. Constipation  1/27- will order sorbitol x1 at 1400  1/28- will give another dose of sorbitol this AM and schedule suppository ~ 1500 after therapy to get him t go- also added Miralax daily for bowels.  18. Insomnia  1/27- will schedule trazodone 50 mg QHS.   1/30 slept well last noc   LOS: 12 days A FACE TO FACE EVALUATION WAS PERFORMED  Render Marley 08/26/2020, 10:11 AM

## 2020-08-26 NOTE — Progress Notes (Signed)
Physical Therapy Session Note  Patient Details  Name: Keith Downs MRN: [id number] Date of Birth: 07-02-1961  Today's Date: 08/26/2020 PT Individual Time:  0900-1000 PT time calculation: 60 min     Short Term Goals: Week 1:  PT Short Term Goal 1 (Week 1): Pt will compete bed mobility with minA and no bed features PT Short Term Goal 1 - Progress (Week 1): Met PT Short Term Goal 2 (Week 1): Pt will complete bed<>chair transfers with minA and LRAD PT Short Term Goal 2 - Progress (Week 1): Met PT Short Term Goal 3 (Week 1): Pt will ambulate 45ft with modA and LRAD PT Short Term Goal 3 - Progress (Week 1): Progressing toward goal  Skilled Therapeutic Interventions/Progress Updates:    Pt received in wc and agreeable to therapy with L AFO and toe cap, cervical collar donned.  Sit<>stand transfers CGA with RW throughout session. Wc propulsion 2x250ft, pt able to manage wheelchair parts with supervision. Pt performed car transfer with RW with min A for LLE management, pt able to exit car with CGA and RW. Pt demonstrates improved  L foot clearance with hip/knee flexion, but continues to fatigue quickly. Is able to recover faster than previous sessions. Gait x60ft, 4x60ft with standing rest breaks between each bout, with RW and min A for balance. Pt educated on ascending and descending stairs laterally with one handrail to simulate home environment, performed x2 with CGA and VCs for technique. Pt performed L elevated lunge on 6" step x 20. Step ups with L foot 2x5. Pt returned to room and sat in recliner, with all needs in reach and bed alarm active.  Therapy Documentation Precautions:  Precautions Precautions: Cervical,Fall Required Braces or Orthoses: Cervical Brace Cervical Brace: Hard collar Restrictions Weight Bearing Restrictions: No Other Position/Activity Restrictions: Hard collar when OOB, may apply sitting EOB   Therapy/Group: Individual Therapy  Sharen Counter, SPT 08/26/2020,  5:31 PM

## 2020-08-26 NOTE — Progress Notes (Signed)
Patient ID: Keith Downs, male   DOB: September 30, 1960, 60 y.o.   MRN: [id number]  SW returned phone call to [name] [phone]) to confirm family edu on 2/2 at 10am, and discussed d/c recommendations. SW informed still waiting on final recommendations for DME and HH or Outpatient therapies. Pt wife aware SW will follow-up once this been confirmed. She reports she has purchased a shower bench and will bring in pt a new pair of shoes that fits his brace when she comes in for family edu on Wednesday.   Cecile Sheerer, MSW, LCSWA Office: [phone] Cell: [phone] Fax: [phone]

## 2020-08-26 NOTE — Progress Notes (Signed)
Physical Therapy Note  Patient Details  Name: Keith Downs MRN: [id number] Date of Birth: 1961/05/12 Today's Date: 08/26/2020    Recommending the following equipment to increase functional independence with mobility: ~ *Standard lightweight wheelchair 18x18 with standard leg rests   Patient measurements:    Hip Width: 16"    Seat Depth (-2"): 20"   W/c cushion: 18x18 foam cushion      Peter Congo, PT, DPT  08/26/2020, 5:31 PM

## 2020-08-27 ENCOUNTER — Inpatient Hospital Stay (HOSPITAL_COMMUNITY): Payer: BC Managed Care – PPO

## 2020-08-27 MED ORDER — ENOXAPARIN (LOVENOX) PATIENT EDUCATION KIT
PACK | Freq: Once | Status: AC
  Filled 2020-08-27: qty 1

## 2020-08-27 NOTE — Progress Notes (Signed)
Occupational Therapy Session Note  Patient Details  Name: Keith Downs MRN: [id number] Date of Birth: [birth year]  Today's Date: 08/27/2020 OT Individual Time: 1345-1430 OT Individual Time Calculation (min): 45 min    Short Term Goals: Week 2:  OT Short Term Goal 1 (Week 2): STG=LTG secondary to ELOS  Skilled Therapeutic Interventions/Progress Updates:    OT intervention with focus on LUE/hand gross and FMC activities and BLE/BUE therex. Table tasks included grasping graded clothes pins with pincer grasp to place on horizontal dowel, vertical dowel, and edge of container. Pt completed task with red, green, and black clothes pins. Pt also donned gloves and cleaned all clothes pins. Pt completed 7 mins at level 5 on NuStep. Pt propelled w/c from room to day room and back. Pt amb with RW from relciner to w/c (5') and back-CGA. Pt remained in recliner with seat alarm activated and all needs within reach.   Therapy Documentation Precautions:  Precautions Precautions: Cervical,Fall Required Braces or Orthoses: Cervical Brace Cervical Brace: Hard collar Restrictions Weight Bearing Restrictions: No Other Position/Activity Restrictions: Hard collar when OOB, may apply sitting EOB  Pain:  Pt denies pain this afternoon   Therapy/Group: Individual Therapy  Rich Brave 08/27/2020, 2:43 PM

## 2020-08-27 NOTE — Progress Notes (Signed)
Physical Therapy Session Note  Patient Details  Name: Keith Downs MRN: [id number] Date of Birth: Dec 30, 1960  Today's Date: 08/27/2020 PT Individual Time: 0900-1000, 1500-1530 PT Individual Time Calculation (min): 60 min, 30 min  Short Term Goals: Week 1:  PT Short Term Goal 1 (Week 1): Pt will compete bed mobility with minA and no bed features PT Short Term Goal 1 - Progress (Week 1): Met PT Short Term Goal 2 (Week 1): Pt will complete bed<>chair transfers with minA and LRAD PT Short Term Goal 2 - Progress (Week 1): Met PT Short Term Goal 3 (Week 1): Pt will ambulate 2ft with modA and LRAD PT Short Term Goal 3 - Progress (Week 1): Progressing toward goal Week 2:  PT Short Term Goal 1 (Week 2): = to LTGs based on ELOS  Skilled Therapeutic Interventions/Progress Updates:    AM session: Pt received in bed after returning from radiology, with L AFO, toe cap and cervical collar in place. Min A with bed mobility to push up from sidelying, STS CGA with RW through out session. Pt propels wc 2x216ft with supervision. Pt reports feeling tired today and seems to have increased difficulty lifting his left foot compared to yesterday's session. Pt transfers to mat table with CGA and RW, sitting to lying CGA. Pt instructed in bridges for HEP. Required VCs to push through L leg and ball between knees to maintain alignment. Pt able to roll to stomach with supervision, trialled quadruped x3 with mod A. Pt tired quickly and had difficulty pushing through arms and maintaining LE alignment. Worked on dynamic balance in parallel bars, pt had difficulty controlling L knee in SLS. Performed mini squats in SLS and DLS to work on control. Pt ambulated in bars to focus on normalizing gait pattern x2 and 2x10' with RW with standing rest breaks to practice concepts. Pt continued to have difficulty due to fatigue, including decreased foot clearance, decreased hip/knee flexion, short step length. Pt returned to room  and  Remained in recliner with all needs in reach and chair alarm set.  PM session: Pt seated on commode after BM on arrival, independent with hygiene, requiring mod A for clothing management, CGA for stand pivot transfer from commode to wc.  Session focused on initiating HEP through medbridge program. Pt required min A at times during session for gait and STS with RW due to fatigue. Pt propelled wc to gym, ~200 ft with supervision.   The following exercises were instructed and performed at edge of mat table w/ RW: -sit to stand x10 -LAQ x10 -mini squats x 10 -standing march x10 -side stepping x1 lap  Pt expressed understanding and was able to access HEP via medbridge GO app on his phone. Pt returned to recliner at end of session and was left with all needs in reach and chair alarm active.   HEP given as follows: Access Code: [place] URL: [email or web address] Date: 08/27/2020 Prepared by: [employer] Exercises Seated Long Arc Quad - 1 x daily - 7 x weekly - 3 sets - 10 reps Supine Bridge - 1 x daily - 7 x weekly - 4 sets - 5 reps Sit to Stand with Counter Support - 1 x daily - 7 x weekly - 3 sets - 10 reps Mini Squats with Walker and Chair - 1 x daily - 7 x weekly - 3 sets - 10 reps Standing March with Counter Support - 1 x daily - 7 x weekly - 3 sets - 10 reps  Side Stepping with Counter Support - 1 x daily - 7 x weekly - 3 sets - 4 reps     Therapy Documentation Precautions:  Precautions Precautions: Cervical,Fall Required Braces or Orthoses: Cervical Brace Cervical Brace: Hard collar Restrictions Weight Bearing Restrictions: No Other Position/Activity Restrictions: Hard collar when OOB, may apply sitting EOB          Therapy/Group: Individual Therapy  Willa Brocks,SPT 08/27/2020, 11:31 AM

## 2020-08-27 NOTE — Plan of Care (Signed)
  Problem: RH Dressing Goal: LTG Patient will perform lower body dressing w/assist (OT) Description: LTG: Patient will perform lower body dressing with assist, with/without cues in positioning using equipment (OT) Flowsheets (Taken 08/27/2020 1503) LTG: Pt will perform lower body dressing with assistance level of: (downgraded JLS) Minimal Assistance - Patient > 75% Note: Downgraded JLS

## 2020-08-27 NOTE — Progress Notes (Signed)
Occupational Therapy Session Note  Patient Details  Name: Geovanni Rahming MRN: [id number] Date of Birth: [birth year]  Today's Date: 08/27/2020 OT Individual Time: 0700-0810 OT Individual Time Calculation (min): 70 min    Short Term Goals: Week 2:  OT Short Term Goal 1 (Week 2): STG=LTG secondary to ELOS  Skilled Therapeutic Interventions/Progress Updates:    OT intervention with focus on sit<>stand, functional transfers, standing balance, BADL retraining including bathing at shower level and dressing with sit<>stand from cair, LUE gross motor and FMC, and safety awrareness to increase independence with BADLs. See Care Tool for assist levels. All stand pivot transfers with CGA. Pt incorporates use of LUE in all bathing and dressing tasks.Pt requires assistance with donning Lt ted hose and AFO/shoe but is able to don ted hose on RLE. Pt used LUE to brush teeth seated at sink. Standing balance with CGA/close supervision. Pt remained seated in recliner with all needs within reach and seat alarm activated.   Therapy Documentation Precautions:  Precautions Precautions: Cervical,Fall Required Braces or Orthoses: Cervical Brace Cervical Brace: Hard collar Restrictions Weight Bearing Restrictions: No Other Position/Activity Restrictions: Hard collar when OOB, may apply sitting EOB Pain:  Pt denies pain this morning  Therapy/Group: Individual Therapy  Rich Brave 08/27/2020, 8:11 AM

## 2020-08-27 NOTE — Patient Care Conference (Signed)
Inpatient RehabilitationTeam Conference and Plan of Care Update Date: 08/27/2020   Time: 11:21 AM    Patient Name: Keith Downs      Medical Record Number: [id number]  Date of Birth: 05-Mar-1961 Sex: Male         Room/Bed: 4W12C/4W12C-02 Payor Info: Payor: BLUE CROSS BLUE [employer]IELD / Plan: BCBS COMM PPO / Product Type: *No Product type* /    Admit Date/Time:  08/14/2020  3:24 PM  Primary Diagnosis:  Cervical myelopathy [facility])  Hospital Problems: Principal Problem:   Cervical myelopathy [facility])    Expected Discharge Date: Expected Discharge Date: 08/30/20  Team Members Present: Physician leading conference: Dr. Genice Rouge Care Coodinator Present: Cecile Sheerer, LCSWA;Jevaughn Degollado Marlyne Beards, RN, BSN, CRRN Nurse Present: Despina Hidden, RN PT Present: Peter Congo, PT OT Present: Ardis Rowan, COTA;Jennifer Smith, OT PPS Coordinator present : Edson Snowball, Park Breed, SLP     Current Status/Progress Goal Weekly Team Focus  Bowel/Bladder   Pt continent of B/B LBM 08/25/2020  Pt maintains continence  Assess B/B every shift and PRN   Swallow/Nutrition/ Hydration             ADL's   bathing-min A; LB dressing-min A: functional tranfsers-CGA/supervision; toileting-CGA; standing balance-supervision/CGA  supervision overall  tranfsers, standing balance, BADL training, education   Mobility   CGA overall with RW, gait up to 50 ft wuth RW min A, supervision wc mobility, stairs CGA  Supervision overall, CGA stairs  LLE NMR, gait training   Communication             Safety/Cognition/ Behavioral Observations            Pain   Pt with occasional right should pain. Recing Tylenol PRN and muscle rub  Pain rating <3/10  Assess pain every shift and PRN   Skin   Healing surgical incision to anterior neck  No new breakdown. No S/Sx of infection  Assess skin every shift and PRN     Discharge Planning:  Pt to d/c to home with his wife who will provider 24/7 care.   Team  Discussion: X-ray of spine this, no real changes. Spasticity doing better, hyponatremia improved, BP on the low side but patient asymptomatic, urgency improving. No blood in urine reported, will go home on Lovenox, please begin teaching. Has pitting edema, having patient to elevate feet when in bed. Incision CDI, Continent B/B. Patient on target to meet rehab goals: Contact guard for most things, family education has started. Downgraded lower body goals due to needing assistance with AFO.  *See Care Plan and progress notes for long and short-term goals.   Revisions to Treatment Plan:  Lovenox added to discharge plan.  Teaching Needs: Family education, Lovenox teaching, medication management, wound/skin care, transfer training, gait training.  Current Barriers to Discharge: Inaccessible home environment, Decreased caregiver support, Home enviroment access/layout, Wound care, Lack of/limited family support, Weight bearing restrictions, Medication compliance and Behavior  Possible Resolutions to Barriers: Continue current medications, weight bearing precautions, provide emotional support to patient and family.     Medical Summary Current Status: LBM last night; hematuria/urgency improved/resolved; will need to go home on Lovenox; LE feet/ankle edema- TEDs/elevation;  Barriers to Discharge: Wound care;Decreased family/caregiver support;Home enviroment access/layout;Medical stability;Weight bearing restrictions  Barriers to Discharge Comments: family training tomorrow; will need outpt PT/OT therapy; CGA most things PT- goals Supervision by OT except AFO. Possible Resolutions to [employer]: will need Lovenox at d/c since not walking enough; con't flomax/finesteride; needs manual w/c 18/x18-  OT- needs A with AFO;  d/c Friday 08/30/20   Continued Need for Acute Rehabilitation Level of Care: The patient requires daily medical management by a physician with specialized training in physical  medicine and rehabilitation for the following reasons: Direction of a multidisciplinary physical rehabilitation program to maximize functional independence : Yes Medical management of patient stability for increased activity during participation in an intensive rehabilitation regime.: Yes Analysis of laboratory values and/or radiology reports with any subsequent need for medication adjustment and/or medical intervention. : Yes   I attest that I was present, lead the team conference, and concur with the assessment and plan of the team.   Kennyth Arnold G 08/27/2020, 1:12 PM

## 2020-08-27 NOTE — Progress Notes (Signed)
Looks good, continuing to improve, pincer better, walking better, home Friday, will check lateral C

## 2020-08-27 NOTE — Progress Notes (Addendum)
Patient ID: Keith Downs, male   DOB: 08-22-1960, 60 y.o.   MRN: [id number]  SW spoke with [name] [phone] )to provide updates from team conference on gains made, and pt remaining on target for his discharge on Friday. SW informed on DME: w/c needed since pt has RW, and outpatient PT/OT. Preferred location is [facility] (Y:[phone]/G:[phone]).   [facility] is accepting new patients but PT will be pushedout atleast 4 weeks. Pt will be seen sooner for OT.   *SW met with pt in room to give updates from team conference.  DME: w/c ordered with [facility] via parachute.   Loralee Pacas, MSW, [place] Office: [phone] Cell: [phone] Fax: [phone]

## 2020-08-27 NOTE — Progress Notes (Signed)
PHYSICAL MEDICINE & REHABILITATION PROGRESS NOTE   Subjective/Complaints:  Saw Dr Yetta Barre who ordered Cervical xray this AM- still shows prevertebral edema- but everything in position.  Ate 2 meals/day at home- realized was gaining weight eating 3 meals/day, so has backed off some.  Had very good BM last night.   Lives in [place], [place] Still feels like Choking a little when lays down supine- could be prevertebral edema?  ROS:   Pt denies SOB, abd pain, CP, N/V/C/D, and vision changes   Objective:   DG Cervical Spine 1 View  Result Date: 08/27/2020 CLINICAL DATA:  Left  neck and  body numbness after neck surgery EXAM: DG CERVICAL SPINE - 1 VIEW COMPARISON:  Cervical MRI 08/09/2020 FINDINGS: ACDF with anterior plate extending from C3 through C6. Corpectomy at C5 with bone graft at the corpectomy site. Bone graft is mildly tilted but appears to be in satisfactory position. Interbody spacer at L3-4 in good position Disc degeneration and spurring at C6-7. Negative for fracture. Mild prevertebral soft tissue swelling which was also present on the prior MRI. IMPRESSION: ACDF C3 through C6.  Corpectomy C5. 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 Electronically Signed   By: Marlan Palau M.D.   On: 08/27/2020 09:09   Recent Labs    08/26/20 0626  WBC 9.7  HGB 13.0  HCT 38.5*  PLT 160   Recent Labs    08/26/20 0626  NA 132*  K 4.5  CL 98  CO2 25  GLUCOSE 114*  BUN 17  CREATININE 0.73  CALCIUM 8.6*    Intake/Output Summary (Last 24 hours) at 08/27/2020 0916 Last data filed at 08/27/2020 0550 Gross per 24 hour  Intake 960 ml  Output 2650 ml  Net -1690 ml        Physical Exam: Vital Signs Blood pressure (!) 96/55, pulse (!) 59, temperature 98.3 F (36.8 C), resp. rate 18, height 5\' 11"  (1.803 m), weight 84.7 kg, SpO2 98 %.   General: sitting up in w/c at sink doing grooming, NAD Mood and affect are appropriate-  bright HENT: cervical collar in place- took off 2 steristrips- were coming off- incision looks great medially- cannot see laterally.  Heart: borderline bradycardic- regular rhythm Lungs: CTA B/L- no W/R/R- good air movement Abdomen: Soft, NT, ND, (+)BS - abd flatter than last week Extremities: No clubbing, cyanosis, or edema Skin: No evidence of breakdown, no evidence of rash IV L hand Neurological: better oriented - Ox3 LLE MAS of 2- much improved ROM/spasticity  L>R hand clawing-/spasticity - hard to close hand on L completely due to this.  Motor: RUE: 5/5 proximal distal LUE: 4 -/5 proximal to distal RLE: 4 --4/5 proximal distal LLE: 3+-4 -/5 hip flexion, knee extension, 2/5 ankle dorsiflexion Sensation diminished light touch LUE/LLE--- no change       Assessment/Plan: 1. Functional deficits which require 3+ hours per day of interdisciplinary therapy in a comprehensive inpatient rehab setting.  Physiatrist is providing close team supervision and 24 hour management of active medical problems listed below.  Physiatrist and rehab team continue to assess barriers to discharge/monitor patient progress toward functional and medical goals  Care Tool:  Bathing    Body parts bathed by patient: Right arm,Left arm,Chest,Abdomen,Front perineal area,Right upper leg,Left upper leg,Right lower leg,Left lower leg,Face,Buttocks   Body parts bathed by helper: Buttocks     Bathing assist Assist Level: Contact Guard/Touching assist     Upper Body Dressing/Undressing Upper  body dressing   What is the patient wearing?: Pull over shirt,Orthosis    Upper body assist Assist Level: Supervision/Verbal cueing    Lower Body Dressing/Undressing Lower body dressing      What is the patient wearing?: Pants     Lower body assist Assist for lower body dressing: Contact Guard/Touching assist     Toileting Toileting    Toileting assist Assist for toileting: Moderate Assistance - Patient 50  - 74%     Transfers Chair/bed transfer  Transfers assist     Chair/bed transfer assist level: Minimal Assistance - Patient > 75% Chair/bed transfer assistive device: Geologist, engineering   Ambulation assist      Assist level: Minimal Assistance - Patient > 75% Assistive device: Walker-rolling Max distance: 57ft   Walk 10 feet activity   Assist     Assist level: Minimal Assistance - Patient > 75% Assistive device: Walker-rolling   Walk 50 feet activity   Assist Walk 50 feet with 2 turns activity did not occur: Safety/medical concerns  Assist level: Minimal Assistance - Patient > 75% Assistive device: Walker-rolling    Walk 150 feet activity   Assist Walk 150 feet activity did not occur: Safety/medical concerns         Walk 10 feet on uneven surface  activity   Assist Walk 10 feet on uneven surfaces activity did not occur: Safety/medical concerns         Wheelchair     Assist Will patient use wheelchair at discharge?: No Type of Wheelchair: Manual    Wheelchair assist level: Supervision/Verbal cueing Max wheelchair distance: 150    Wheelchair 50 feet with 2 turns activity    Assist        Assist Level: Supervision/Verbal cueing   Wheelchair 150 feet activity     Assist      Assist Level: Supervision/Verbal cueing   Blood pressure (!) 96/55, pulse (!) 59, temperature 98.3 F (36.8 C), resp. rate 18, height 5\' 11"  (1.803 m), weight 84.7 kg, SpO2 98 %.  Medical Problem List and Plan: 1.  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             -ELOS/Goals: 14-18 days/supervision/min a  1/22- MAY shower- just cover incision  1/24: plan to wean decadron?  1/25- decrease decadron to 2 mg BID x 3 days then QHS x3 days then stop  2/1- team cofnerence today to finalize d/c plans- wants [facility] in [place], on [address]               -Continue CIR therapies including PT, OT  Improved 2.  Antithrombotics: -DVT/anticoagulation:  Mechanical: Sequential compression devices, below knee Bilateral lower extremities  1/22- started lovenox  2/1- will check with therapy if walking far enough to go home off Lovenox?             -antiplatelet therapy: N/a 3. Pain Management: On oxycodone prn.    1/26- pain controlled- spasticity a little better as below- con't regimen  1/27- muscle tightness in naterior neck bothersome- muscle relaxer helps- con't regimen  2/1- the choking feeling likely edema- con't regimen for pain.              Monitor with increased exertion, particularly for neuropathic pain 4. Mood: LCSW to follow for evaluation and support.              -antipsychotic agents: N/A 5. Neuropsych:  This patient is capable of making decisions on his own behalf. 6. Skin/Wound Care: Routine pressure relief measures 7. Fluids/Electrolytes/Nutrition: Monitor I/Os             1/24. Hyponatremia of 126 (136 08/15/20), normal BUN/Cr/K+   -hold HCTZ (already received today)   -recheck bmet tomorrow  1/25- Na down to 124 today- off HCTZ- since BUN also up to 26- will give NS IVFs 75cc hr x 1 days and BP also somewhat low- could be worse due to steroids which I'm tapering.   1/26- pt also admits has been drinking a LOT due to dry mouth- suggested gum/ice/hard candy to help dry mouth since drinking too much  1/27- Na up to 131 and BUN down to 18- doing much better- will recheck in AM since off IVFs  1/28- labs Monday with improvement in labs- if Symptomatic, recheck BMP  1/31- Na up to 132- so can come off IVFs/IV- BUN down to 17- improved 8. HTN: Monitor BP tid--continue HCTZ and Avapro. Monitor for orthostasis.    Vitals:   08/27/20 0452 08/27/20 0821  BP: (!) 96/55   Pulse: (!) 59   Resp: 18   Temp: 98.3 F (36.8 C)   SpO2: 99% 98%  Controlled on Avapro, off hydrochlorothiazide Flomax and tizanidine may be contributing to  lower BPs, no dizziness or other symptoms 2/1- BP a little low/soft- denies orthostasis- con't regimen 9. GERD: Resume PPI.   1/22- increased to BID per pt request from Sx's.  10. Hypothyroid: Continue supplement 11. COPD: Encourage pulmonary hygiene with IS and monitor respiratory status with increase in activity. Continue Dulera with albuterol prn SOB. .  12. Spasticity: Baclofen tid-->may need titration upwards to help manage symptoms.   1/20- will increase Baclofen to 10 mg QID and then add prn Zanaflex 4 mg TID prn for increasing spasms, esp at night- explained can cause sedation/constipaiton. Of note, spasticity worse at night.   1/21- forgot to take zanaflex- will write to offer it- slept better with less cramping- will increase Baclofen Sat/Sunday  1/22- forgetting Zanaflex- will scheduled 4 mg QHS as well as baclofen  1/23-24- spasticity improving- once discharged, can possibly arrange for Botox toxin after d/c. Titrate tizanidine further as needed  1/28- spasticity slightly better in LLE- will hold off making changes today, but if need be, can increase Baclofen OR Zanaflex over the weekend.   1/31- Spasticity- MAS down to 2- much improved- con't regimen  2/1- L hand also affected, but spasticity meds working great 13. Leukocytosis: likely steroids  1/20- WBC 13.4- but on decadron- will check U/A and Cx to make sure doesn't have UTI.    1/21- U/A (-)- is from decadron  1/22- questionable dysuria last night- pt denied?  1/23- pt denies dysuria today and hematuria- but sounds like he had yesterday based on his language now.  1/26- weaning Steroids to help with leukocytosis.  1/27- WBC 11.2- doing better with decrease in steroids  14. Frequent urination  1/21- hx of prostate enlargement- will start Flomax -.4 mg q supper- since sounds like not emptying.  1/23- peeing ~350cc at a time- so less often- con't flomax  1/27- Also on finesteride 5mg  QHS- per Urology- is voiding/emptying better  per pt, but still has urgency.   1/28- urgency better- con't regimen  1/31- urgency almost resolved- con't both meds 15. Hematuria  1/25- documented in chart x 2- U/A on 1/20 was (-) for hematuria/blood in urine- will call Urology to see  what the next step would be.   1/27- On finesteride- no more hematuria so far- con't to monitor 16. Hyponatremia  1/25- down to 124- will give IVFs NS 75cc/hr - could have been due to HCTZ? Which has stopped or Decadron, which I'm tapering- will monitor.   1/26- will check CBC and BMP in AM- suggest not drinking so much so can improve Na and as wean steroids, should also help.   1/27- Na up to 131 and BUN odwn to 18- off IVFs- con't regimen 17. Constipation  1/27- will order sorbitol x1 at 1400  1/28- will give another dose of sorbitol this AM and schedule suppository ~ 1500 after therapy to get him t go- also added Miralax daily for bowels.   2/1- LBM yesterday doing better 18. Insomnia  1/27- will schedule trazodone 50 mg QHS.   1/30 slept well last noc   LOS: 13 days A FACE TO FACE EVALUATION WAS PERFORMED  Glennon Kopko 08/27/2020, 9:16 AM

## 2020-08-28 NOTE — Progress Notes (Signed)
Physical Therapy Discharge Summary  Patient Details  Name: Keith Downs MRN: [id number] Date of Birth: [birth year]  Today's Date: 08/29/2020 PT Individual Time: 4098-1191 PT Individual Time Calculation (min): 45 min    Patient has met 7 of 9 long term goals due to improved activity tolerance, improved balance, improved postural control, increased strength and ability to compensate for deficits.  Patient to discharge at a wheelchair level Independent and CGA for transfers and short distance ambulation.   Patient's care partner is independent to provide the necessary physical assistance at discharge. Pt's wife Keith Downs has completed hands-on family education and is safe to assist patient upon d/c home.  Reasons goals not met: Pt continues to require minA for bed mobility and car transfers due to continued difficulty lifting LLE requiring minA. Pt's care partner has been educated and is proficient in assisting.   Recommendation:  Patient will benefit from ongoing skilled PT services in outpatient setting to continue to advance safe functional mobility, address ongoing impairments in endurance, strength, balance, safety, independence with functional mobility, and minimize fall risk.  Equipment: 18x18 w/c  Reasons for discharge: treatment goals met and discharge from hospital  Patient/family agrees with progress made and goals achieved: Yes  PT Discharge Precautions/Restrictions Precautions Precautions: Cervical;Fall Required Braces or Orthoses: Cervical Brace Cervical Brace: Hard collar Restrictions Weight Bearing Restrictions: No Other Position/Activity Restrictions: Hard collar when OOB, may apply sitting EOB Vital Signs Therapy Vitals Temp: 97.8 F (36.6 C) Pulse Rate: 84 Resp: 17 BP: (!) 103/46 Patient Position (if appropriate): Sitting Oxygen Therapy SpO2: 100 % O2 Device: Room Air Pain Pain Assessment Pain Scale: 0-10 Pain Score: 0-No pain Vision/Perception   Perception Perception: Within Functional Limits Praxis Praxis: Intact  Cognition Overall Cognitive Status: Within Functional Limits for tasks assessed Arousal/Alertness: Awake/alert Orientation Level: Oriented X4 Sensation Sensation Light Touch: Impaired by gross assessment (LLE) Hot/Cold: Appears Intact Proprioception: Appears Intact Stereognosis: Impaired by gross assessment Coordination Gross Motor Movements are Fluid and Coordinated: No Motor  Motor Motor: Abnormal tone;Hemiplegia Motor - Discharge Observations: significantly improved tone, still some ataxia  Mobility Bed Mobility Bed Mobility: Supine to Sit;Sit to Supine Supine to Sit: Supervision/Verbal cueing Sit to Supine: Minimal Assistance - Patient > 75% Transfers Transfers: Sit to Stand;Stand to Sit;Stand Pivot Transfers Sit to Stand: Supervision/Verbal cueing Stand to Sit: Supervision/Verbal cueing Stand Pivot Transfers: Supervision/Verbal cueing Transfer (Assistive device): Rolling walker Locomotion  Gait Ambulation: Yes Gait Assistance: Contact Guard/Touching assist Gait Distance (Feet): 56 Feet Assistive device: Rolling walker Gait Gait: Yes Gait Pattern: Impaired Gait Pattern: Step-to pattern;Step-through pattern;Decreased step length - left;Decreased stride length;Decreased hip/knee flexion - left;Decreased dorsiflexion - left;Ataxic (Use of AFO and toe cap. Pt difficulty using hip flexors effectively to advance foot (able to perform intermittently) able to compensate by flexing knee and sliding foot foward) Stairs / Additional Locomotion Stairs: Yes Stairs Assistance: Contact Guard/Touching assist Stair Management Technique: One rail Left Number of Stairs: 4 Height of Stairs: 6 Wheelchair Mobility Wheelchair Mobility: Yes Wheelchair Assistance: Set up Lexicographer: Both upper extremities Wheelchair Parts Management: Independent Distance: >357ft  Trunk/Postural Assessment   Cervical Assessment Cervical Assessment:  (cervical collar) Thoracic Assessment Thoracic Assessment: Within Functional Limits Lumbar Assessment Lumbar Assessment: Within Functional Limits Postural Control Postural Control: Within Functional Limits  Balance Balance Balance Assessed: Yes Static Sitting Balance Static Sitting - Balance Support: Feet supported Static Sitting - Level of Assistance: 7: Independent Dynamic Sitting Balance Dynamic Sitting - Balance Support: Feet supported Dynamic Sitting - Level  of Assistance: 6: Modified independent (Device/Increase time) Dynamic Sitting - Balance Activities: Reaching for objects Static Standing Balance Static Standing - Balance Support: Bilateral upper extremity supported Static Standing - Level of Assistance: 5: Stand by assistance Dynamic Standing Balance Dynamic Standing - Balance Support: During functional activity Dynamic Standing - Level of Assistance: 5: Stand by assistance Dynamic Standing - Balance Activities: Kicking ball;Reaching for objects;Reaching across midline Extremity Assessment      RLE Assessment RLE Assessment: Exceptions to [facility] General Strength Comments: grossly 4+/5 LLE Assessment LLE Assessment: Exceptions to [facility] General Strength Comments: ankle PF 3/5, DF 2/5, knee extension 4/5, knee flexion 3/5 hip flexion 3/5     Excell Seltzer, PT, DPT Page Spiro, PT, DPT 08/29/2020, 4:28 PM

## 2020-08-28 NOTE — Progress Notes (Signed)
Occupational Therapy Session Note  Patient Details  Name: [name] MRN: [id number] Date of Birth: 05-05-1961  Today's Date: 08/28/2020 OT Individual Time: 1000-1100 OT Individual Time Calculation (min): 60 min    Short Term Goals: Week 2:  OT Short Term Goal 1 (Week 2): STG=LTG secondary to ELOS  Skilled Therapeutic Interventions/Progress Updates:    Pt resting in recliner upon arrival with wife present for education. Pt tranfserred to w/c and TTB for shower. Pt completed shower with wife assisting appropriately. Pt continues to use LUE to assist with bathing tasks. Sit<>stand with CGA for wife to bathe buttocks. Pt returned to w/c and transferred to recliner to complete dressing. Pt requires assistance with donning ted hose and shoes. Wife instructed in technique for donning ted hose and AFO. Pt with new shoes which were easier to don. Pt amb with RW to sink and completed grooming tasks seated in w/c at sink. Wife present awaiting PT. Pt's wife provides the appropriate level of supervision/assistance.   Therapy Documentation Precautions:  Precautions Precautions: Cervical,Fall Required Braces or Orthoses: Cervical Brace Cervical Brace: Hard collar Restrictions Weight Bearing Restrictions: No Other Position/Activity Restrictions: Hard collar when OOB, may apply sitting EOB  Pain: Pain Assessment Pain Scale: 0-10 Pain Score: 0-No pain   Therapy/Group: Individual Therapy  Rich Brave 08/28/2020, 12:26 PM

## 2020-08-28 NOTE — Progress Notes (Signed)
Physical Therapy Session Note  Patient Details  Name: Keith Downs MRN: [id number] Date of Birth: [birth year]  Today's Date: 08/28/2020 PT Individual Time: 1100-1200, 1452-1530 PT Individual Time Calculation (min): 60 min, 38 min  Short Term Goals: Week 2:  PT Short Term Goal 1 (Week 2): = to LTGs based on ELOS  Skilled Therapeutic Interventions/Progress Updates:     AM session: Pt seated in wc on arrival, with wife Keith Downs present. This session focused on family education for safe return home. Pt propelled wc mod I 4x300 ft, with assistance from wife to manage leg rests. Pt's wife educated on folding wc and managing leg rests. Pt able to perform car transfer with min A to lift LLE into car, pt practiced transfer with assist from wife. Pt able to exit car mod I. Stairs (6") 2x4 with CGA, pt and wife expressing comfort with side stepping technique. Pt's wife educated on watching for knee buckling or snapping into extension. Discussed home set up, and difficulty walking on carpet. Bed mobility with min A for lifting LEs into bed. Discussed using urinal at night to avoid getting up without AFO and cervical brace. Gait with CGA from wife x20 ft, pt fatigued quickly after performing several transfers. Demonstrates low foot clearance and step length, decreased hip and knee flexion. Discussed planning ahead when performing tiring tasks. Pt requests to go over HEP with wife. Demonstrated HEP and discussed proper form and things to watch for. Pt returns to recliner with all needs in reach and wife present.  PM session:  Pt received seated in recliner with wife Keith Downs present. Sit <> stand transfer supervision with RW throughout session. Gait CGA throughout session with RW for short distances. Pt demonstrates improved foot advancement in new shoes, even without toe cap. Pt continues to need standing rest breaks during gait due to fatigue, educated on being sure to bring L foot flat on the floor before  taking a break. Pt propelled wc 2x147ft and transferred to nustep with CGA and RW. Performed for 2x5 min on level 5 at 70+ spm, reported RPE of 13/20. Pt performed standing ball toss with Bonnie x10 min, with min A and VCs to return to center when leaning, tended to lose balance posterior and to L side. Pt returned to room and remained seated in recliner with all needs in reach and wife present.   Therapy Documentation Precautions:  Precautions Precautions: Cervical,Fall Required Braces or Orthoses: Cervical Brace Cervical Brace: Hard collar Restrictions Weight Bearing Restrictions: No Other Position/Activity Restrictions: Hard collar when OOB, may apply sitting EOB     Therapy/Group: Individual Therapy  Dyane Dustman, SPT 08/28/2020, 12:04 PM

## 2020-08-28 NOTE — Progress Notes (Signed)
Patient ID: Keith Downs, male   DOB: Aug 05, 1960, 60 y.o.   MRN: [id number]  SW faxed outpatient PT/OT to [facility] referral ([phone]/f:[phone]).  *SW confirms referral was received and will follow-up with pt wife to schedule.   Cecile Sheerer, MSW, LCSWA Office: [phone] Cell: [phone] Fax: [phone]

## 2020-08-28 NOTE — Progress Notes (Signed)
Patient gave himself lovenox shot with verbal cueing. Wife provided with packet about lovenox administration

## 2020-08-28 NOTE — Progress Notes (Signed)
[place] PHYSICAL MEDICINE & REHABILITATION PROGRESS NOTE   Subjective/Complaints:  Pt is reports 4 BMs in 24 hours- will change Miralax to prn.  But feels cleaned out, which is good.   Wife doing training today- pt/wife was trained on lovenox- how to do- OK per nursing- will send home on Lovenox.   Doing well otherwise- not even taking tylenol for pain.    ROS:   Pt denies SOB, abd pain, CP, N/V/C/D, and vision changes    Objective:   DG Cervical Spine 1 View  Result Date: 08/27/2020 CLINICAL DATA:  Left  neck and  body numbness after neck surgery EXAM: DG CERVICAL SPINE - 1 VIEW COMPARISON:  Cervical MRI 08/09/2020 FINDINGS: ACDF with anterior plate extending from C3 through C6. Corpectomy at C5 with bone graft at the corpectomy site. Bone graft is mildly tilted but appears to be in satisfactory position. Interbody spacer at L3-4 in good position Disc degeneration and spurring at C6-7. Negative for fracture. Mild prevertebral soft tissue swelling which was also present on the prior MRI. IMPRESSION: ACDF C3 through C6.  Corpectomy C5. 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 Electronically Signed   By: Marlan Palau M.D.   On: 08/27/2020 09:09   Recent Labs    08/26/20 0626  WBC 9.7  HGB 13.0  HCT 38.5*  PLT 160   Recent Labs    08/26/20 0626  NA 132*  K 4.5  CL 98  CO2 25  GLUCOSE 114*  BUN 17  CREATININE 0.73  CALCIUM 8.6*    Intake/Output Summary (Last 24 hours) at 08/28/2020 1517 Last data filed at 08/28/2020 1239 Gross per 24 hour  Intake 640 ml  Output 300 ml  Net 340 ml        Physical Exam: Vital Signs Blood pressure 100/65, pulse (!) 54, temperature 98 F (36.7 C), resp. rate 16, height 5\' 11"  (1.803 m), weight 84.7 kg, SpO2 97 %.   General: sitting up in bedside chair, appropriate, wearing collar, NAD Mood and affect are appropriate HENT: incision looks good0 less swelling Heart: bradycardic-  regular rhythm Lungs: CTA B/L- no W/R/R- good air movement Abdomen:Soft, NT, ND, (+)BS = Extremities: No clubbing, cyanosis, or edema Skin: No evidence of breakdown, no evidence of rash Neurological: better oriented - Ox3 LLE MAS of 2- much improved- no change today  ROM/spasticity  L>R hand clawing-/spasticity - hard to close hand on L completely due to this.  Motor: RUE: 5/5 proximal distal LUE: 4 -/5 proximal to distal RLE: 4 --4/5 proximal distal LLE: 3+-4 -/5 hip flexion, knee extension, 2/5 ankle dorsiflexion Sensation diminished light touch LUE/LLE--- no change       Assessment/Plan: 1. Functional deficits which require 3+ hours per day of interdisciplinary therapy in a comprehensive inpatient rehab setting.  Physiatrist is providing close team supervision and 24 hour management of active medical problems listed below.  Physiatrist and rehab team continue to assess barriers to discharge/monitor patient progress toward functional and medical goals  Care Tool:  Bathing    Body parts bathed by patient: Right arm,Left arm,Chest,Abdomen,Front perineal area,Right upper leg,Left upper leg,Right lower leg,Left lower leg,Face,Buttocks   Body parts bathed by helper: Buttocks     Bathing assist Assist Level: Contact Guard/Touching assist     Upper Body Dressing/Undressing Upper body dressing   What is the patient wearing?: Pull over shirt,Orthosis    Upper body assist Assist Level: Supervision/Verbal cueing    Lower Body  Dressing/Undressing Lower body dressing      What is the patient wearing?: Pants     Lower body assist Assist for lower body dressing: Contact Guard/Touching assist     Toileting Toileting    Toileting assist Assist for toileting: Moderate Assistance - Patient 50 - 74%     Transfers Chair/bed transfer  Transfers assist     Chair/bed transfer assist level: Contact Guard/Touching assist Chair/bed transfer assistive device: Civil engineer, contracting   Ambulation assist      Assist level: Minimal Assistance - Patient > 75% Assistive device: Walker-rolling Max distance: 15   Walk 10 feet activity   Assist     Assist level: Minimal Assistance - Patient > 75% Assistive device: Walker-rolling   Walk 50 feet activity   Assist Walk 50 feet with 2 turns activity did not occur: Safety/medical concerns  Assist level: Minimal Assistance - Patient > 75% Assistive device: Walker-rolling    Walk 150 feet activity   Assist Walk 150 feet activity did not occur: Safety/medical concerns         Walk 10 feet on uneven surface  activity   Assist Walk 10 feet on uneven surfaces activity did not occur: Safety/medical concerns         Wheelchair     Assist Will patient use wheelchair at discharge?: No Type of Wheelchair: Manual    Wheelchair assist level: Supervision/Verbal cueing Max wheelchair distance: 150    Wheelchair 50 feet with 2 turns activity    Assist        Assist Level: Supervision/Verbal cueing   Wheelchair 150 feet activity     Assist      Assist Level: Supervision/Verbal cueing   Blood pressure 100/65, pulse (!) 54, temperature 98 F (36.7 C), resp. rate 16, height 5\' 11"  (1.803 m), weight 84.7 kg, SpO2 97 %.  Medical Problem List and Plan: 1.  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             -ELOS/Goals: 14-18 days/supervision/min a  1/22- MAY shower- just cover incision  1/24: plan to wean decadron?  1/25- decrease decadron to 2 mg BID x 3 days then QHS x3 days then stop  2/1- team cofnerence today to finalize d/c plans- wants [facility] in [place], on [address]              -Continue CIR therapies including PT, OT  Improved 2.  Antithrombotics: -DVT/anticoagulation:  Mechanical: Sequential compression devices, below knee Bilateral lower  extremities  1/22- started lovenox  2/1- will check with therapy if walking far enough to go home off Lovenox?  2/2- needs lovenox at d/c- trained to do.              -antiplatelet therapy: N/a 3. Pain Management: On oxycodone prn.    1/26- pain controlled- spasticity a little better as below- con't regimen  1/27- muscle tightness in naterior neck bothersome- muscle relaxer helps- con't regimen  2/1- the choking feeling likely edema- con't regimen for pain.  2/2- not taking even tylenol for pain- so won't need pain meds at d/c.              Monitor with increased exertion, particularly for neuropathic pain 4. Mood: LCSW to follow for evaluation and support.              -antipsychotic agents: N/A 5. Neuropsych: This patient is capable of making  decisions on his own behalf. 6. Skin/Wound Care: Routine pressure relief measures 7. Fluids/Electrolytes/Nutrition: Monitor I/Os             1/24. Hyponatremia of 126 (136 08/15/20), normal BUN/Cr/K+   -hold HCTZ (already received today)   -recheck bmet tomorrow  1/25- Na down to 124 today- off HCTZ- since BUN also up to 26- will give NS IVFs 75cc hr x 1 days and BP also somewhat low- could be worse due to steroids which I'm tapering.   1/26- pt also admits has been drinking a LOT due to dry mouth- suggested gum/ice/hard candy to help dry mouth since drinking too much  1/27- Na up to 131 and BUN down to 18- doing much better- will recheck in AM since off IVFs  1/28- labs Monday with improvement in labs- if Symptomatic, recheck BMP  1/31- Na up to 132- so can come off IVFs/IV- BUN down to 17- improved 8. HTN: Monitor BP tid--continue HCTZ and Avapro. Monitor for orthostasis.    Vitals:   08/28/20 0828 08/28/20 1340  BP:  100/65  Pulse:  (!) 54  Resp:  16  Temp:  98 F (36.7 C)  SpO2: 99% 97%  Controlled on Avapro, off hydrochlorothiazide Flomax and tizanidine may be contributing to lower BPs, no dizziness or other symptoms 2/1- BP a little  low/soft- denies orthostasis- con't regimen 2/2- doing well 9. GERD: Resume PPI.   1/22- increased to BID per pt request from Sx's.  10. Hypothyroid: Continue supplement 11. COPD: Encourage pulmonary hygiene with IS and monitor respiratory status with increase in activity. Continue Dulera with albuterol prn SOB. .  12. Spasticity: Baclofen tid-->may need titration upwards to help manage symptoms.   1/20- will increase Baclofen to 10 mg QID and then add prn Zanaflex 4 mg TID prn for increasing spasms, esp at night- explained can cause sedation/constipaiton. Of note, spasticity worse at night.   1/21- forgot to take zanaflex- will write to offer it- slept better with less cramping- will increase Baclofen Sat/Sunday  1/22- forgetting Zanaflex- will scheduled 4 mg QHS as well as baclofen  1/23-24- spasticity improving- once discharged, can possibly arrange for Botox toxin after d/c. Titrate tizanidine further as needed  1/28- spasticity slightly better in LLE- will hold off making changes today, but if need be, can increase Baclofen OR Zanaflex over the weekend.   1/31- Spasticity- MAS down to 2- much improved- con't regimen  2/1- L hand also affected, but spasticity meds working great  2/2- doing great- on'y cause of pain- con't regimen 13. Leukocytosis: likely steroids  1/20- WBC 13.4- but on decadron- will check U/A and Cx to make sure doesn't have UTI.    1/21- U/A (-)- is from decadron  1/22- questionable dysuria last night- pt denied?  1/23- pt denies dysuria today and hematuria- but sounds like he had yesterday based on his language now.  1/26- weaning Steroids to help with leukocytosis.  1/27- WBC 11.2- doing better with decrease in steroids  14. Frequent urination  1/21- hx of prostate enlargement- will start Flomax -.4 mg q supper- since sounds like not emptying.  1/23- peeing ~350cc at a time- so less often- con't flomax  1/27- Also on finesteride 5mg  QHS- per Urology- is  voiding/emptying better per pt, but still has urgency.   1/28- urgency better- con't regimen  1/31- urgency almost resolved- con't both meds 15. Hematuria  1/25- documented in chart x 2- U/A on 1/20 was (-) for hematuria/blood in  urine- will call Urology to see what the next step would be.   1/27- On finesteride- no more hematuria so far- con't to monitor 16. Hyponatremia  1/25- down to 124- will give IVFs NS 75cc/hr - could have been due to HCTZ? Which has stopped or Decadron, which I'm tapering- will monitor.   1/26- will check CBC and BMP in AM- suggest not drinking so much so can improve Na and as wean steroids, should also help.   1/27- Na up to 131 and BUN odwn to 18- off IVFs- con't regimen 17. Constipation  1/27- will order sorbitol x1 at 1400  1/28- will give another dose of sorbitol this AM and schedule suppository ~ 1500 after therapy to get him t go- also added Miralax daily for bowels.   2/1- LBM yesterday doing better  2/2- made miralax prn- since having a lot of BMs 18. Insomnia  1/27- will schedule trazodone 50 mg QHS.   1/30 slept well last noc   LOS: 14 days A FACE TO FACE EVALUATION WAS PERFORMED  Ilithyia Titzer 08/28/2020, 3:17 PM

## 2020-08-28 NOTE — Progress Notes (Signed)
Orthopedic Tech Progress Note Patient Details:  Stillman Buenger April 08, 1961 [id number] Called in order to [facility] for a TOE CAP Patient ID: Kaion Tisdale, male   DOB: [birth year], 60 y.o.   MRN: [id number]   Donald Pore 08/28/2020, 1:40 PM

## 2020-08-28 NOTE — Progress Notes (Signed)
Occupational Therapy Session Note  Patient Details  Name: Jonah Gingras MRN: [id number] Date of Birth: 1960-12-31  Today's Date: 08/28/2020 OT Individual Time: 1345-1430 OT Individual Time Calculation (min): 45 min    Short Term Goals: Week 2:  OT Short Term Goal 1 (Week 2): STG=LTG secondary to ELOS  Skilled Therapeutic Interventions/Progress Updates:    Pt resting in recliner with wife present to continue education. Pt transferred to w/c and propelled w/c to tub room. Pt practiced TTB transfers with wife assisting. Pt provided appropriate level of assistance/supervision. Pt propelled w/c back to day room and reviewed Lt hand theraputty HEP and activities to improve LUE functional use. Pt and wife independent with activities following handout. Pt returned to room and transferred to recliner. Pt remained in recliner with all needs within reach. Wife present.  Therapy Documentation Precautions:  Precautions Precautions: Cervical,Fall Required Braces or Orthoses: Cervical Brace Cervical Brace: Hard collar Restrictions Weight Bearing Restrictions: No Other Position/Activity Restrictions: Hard collar when OOB, may apply sitting EOB General:   Pain:  Pt denies pain this afternoon. Pt received muscle relaxer prior to therapy.   Therapy/Group: Individual Therapy  Rich Brave 08/28/2020, 2:32 PM

## 2020-08-29 ENCOUNTER — Other Ambulatory Visit: Payer: Self-pay | Admitting: Physical Medicine and Rehabilitation

## 2020-08-29 DIAGNOSIS — N401 Enlarged prostate with lower urinary tract symptoms: Secondary | ICD-10-CM

## 2020-08-29 DIAGNOSIS — E871 Hypo-osmolality and hyponatremia: Secondary | ICD-10-CM

## 2020-08-29 MED ORDER — TAMSULOSIN HCL 0.4 MG PO CAPS: 0.4000 mg | ORAL_CAPSULE | Freq: Every day | ORAL | 0 refills | Status: DC

## 2020-08-29 MED ORDER — TIZANIDINE HCL 4 MG PO TABS: 4.0000 mg | ORAL_TABLET | Freq: Three times a day (TID) | ORAL | 0 refills | Status: DC | PRN

## 2020-08-29 MED ORDER — GABAPENTIN 300 MG PO CAPS: 300.0000 mg | ORAL_CAPSULE | Freq: Three times a day (TID) | ORAL | 0 refills | Status: DC

## 2020-08-29 MED ORDER — TRAZODONE HCL 50 MG PO TABS: 50.0000 mg | ORAL_TABLET | Freq: Every day | ORAL | 0 refills | Status: DC

## 2020-08-29 MED ORDER — ENOXAPARIN SODIUM 40 MG/0.4ML ~~LOC~~ SOLN: 40.0000 mg | SUBCUTANEOUS | 0 refills | Status: DC

## 2020-08-29 MED ORDER — POLYETHYLENE GLYCOL 3350 17 G PO PACK: 17.0000 g | PACK | Freq: Every day | ORAL | 0 refills | Status: AC | PRN

## 2020-08-29 MED ORDER — SENNOSIDES-DOCUSATE SODIUM 8.6-50 MG PO TABS: 1.0000 | ORAL_TABLET | Freq: Every day | ORAL | Status: DC | PRN

## 2020-08-29 MED ORDER — FINASTERIDE 5 MG PO TABS: 5.0000 mg | ORAL_TABLET | Freq: Every day | ORAL | 0 refills | Status: AC

## 2020-08-29 MED ORDER — IRBESARTAN 75 MG PO TABS: 75.0000 mg | ORAL_TABLET | Freq: Every day | ORAL | 0 refills | Status: DC

## 2020-08-29 MED ORDER — SENNOSIDES-DOCUSATE SODIUM 8.6-50 MG PO TABS: 1.0000 | ORAL_TABLET | Freq: Every day | ORAL | 0 refills | Status: DC | PRN

## 2020-08-29 MED ORDER — TRAMADOL HCL 50 MG PO TABS: 50.0000 mg | ORAL_TABLET | Freq: Two times a day (BID) | ORAL | 0 refills | Status: AC | PRN

## 2020-08-29 MED ORDER — BACLOFEN 10 MG PO TABS: 10.0000 mg | ORAL_TABLET | Freq: Four times a day (QID) | ORAL | 0 refills | Status: DC

## 2020-08-29 NOTE — Progress Notes (Signed)
Occupational Therapy Discharge Summary  Patient Details  Name: Keith Downs MRN: [id number] Date of Birth: 01-22-1961  Patient has met 9 of 9 long term goals due to improved activity tolerance, improved balance, ability to compensate for deficits, functional use of  RIGHT upper, RIGHT lower, LEFT upper and LEFT lower extremity and improved coordination.  Pt made excellent progress with BADLs and functional transfers during this admission. Pt requires min assistance for donning ted hose and Lt shoe/AFO. Pt requires CGA for shower tranfsers. Pt completes all other BADLs and functional transfers with supervision. Pt is independent with BUE/hand HEP. Pt's wife has been present for and participated in therapy session. Pt's wife provides the appropriate level of assistance/supervision. Patient's care partner is independent to provide the necessary physical assistance at discharge.    Recommendation:  Patient will benefit from ongoing skilled OT services in outpatient setting to continue to advance functional skills in the area of BADL and Reduce care partner burden.  Equipment: No equipment provided Pt purchased TTB, declines use of BSC  Reasons for discharge: treatment goals met and discharge from hospital  Patient/family agrees with progress made and goals achieved: Yes  OT Discharge Vision Baseline Vision/History: Wears glasses Wears Glasses: Reading only Patient Visual Report: No change from baseline Vision Assessment?: No apparent visual deficits Perception  Perception: Within Functional Limits Praxis Praxis: Intact Cognition Overall Cognitive Status: Within Functional Limits for tasks assessed Arousal/Alertness: Awake/alert Orientation Level: Oriented X4 Attention: Selective Selective Attention: Appears intact Memory: Appears intact Awareness: Appears intact Problem Solving: Appears intact Safety/Judgment: Appears intact Sensation Sensation Light Touch: Impaired by  gross assessment (LUE and Lt trunk) Hot/Cold: Appears Intact Proprioception: Appears Intact Stereognosis: Impaired by gross assessment Stereognosis Impaired Details: Impaired LUE Coordination Finger Nose Finger Test: RUE WFL, LUE delayed 9 Hole Peg Test: unable to complete with LUE Motor  Motor Motor: Abnormal tone;Hemiplegia;Ataxia    Trunk/Postural Assessment  Cervical Assessment Cervical Assessment:  (cervical collar) Thoracic Assessment Thoracic Assessment: Within Functional Limits Lumbar Assessment Lumbar Assessment: Within Functional Limits Postural Control Postural Control: Within Functional Limits  Balance Static Sitting Balance Static Sitting - Balance Support: Feet supported Static Sitting - Level of Assistance: 7: Independent Dynamic Sitting Balance Dynamic Sitting - Balance Support: During functional activity Dynamic Sitting - Level of Assistance: 6: Modified independent (Device/Increase time) Extremity/Trunk Assessment RUE Assessment RUE Assessment: Within Functional Limits LUE Assessment Passive Range of Motion (PROM) Comments: WFL Active Range of Motion (AROM) Comments: WFL except for difficulty reaching all the way to R shoulder General Strength Comments: 4-/5, slight tone in finger flexors   [name] 08/29/2020, 6:58 AM

## 2020-08-29 NOTE — Progress Notes (Signed)
Patient gave himself lovenox shot with minimal verbal cues and assistance removing cap

## 2020-08-29 NOTE — Progress Notes (Signed)
Occupational Therapy Session Note  Patient Details  Name: Keith Downs MRN: [id number] Date of Birth: 05-04-1961  Today's Date: 08/29/2020 OT Individual Time: 5638-7564 OT Individual Time Calculation (min): 75 min    Short Term Goals: Week 2:  OT Short Term Goal 1 (Week 2): STG=LTG secondary to ELOS  Skilled Therapeutic Interventions/Progress Updates:    OT intervention with focus on bathing at shower level and dressing with sit<>stand from recliner. Bathing at supervision level using grab bars for standing balance. Pt able to reach to buttocks with RUE. Pt completed dressing tasks with sit<>stand. Pt able to don Rt shoe but requires assistance donning ted hose and Lt shoe/AFO. Pt completed grooming tasks at sink. Pt pleased with progress and ready for discharge home tomorrow with wife providing supervision and assistance.   Therapy Documentation Precautions:  Precautions Precautions: Cervical,Fall Required Braces or Orthoses: Cervical Brace Cervical Brace: Hard collar Restrictions Weight Bearing Restrictions: No Other Position/Activity Restrictions: Hard collar when OOB, may apply sitting EOB Pain:   Pt denies pain this morning   Therapy/Group: Individual Therapy  Rich Brave 08/29/2020, 8:17 AM

## 2020-08-29 NOTE — Progress Notes (Signed)
[facility] PHYSICAL MEDICINE & REHABILITATION PROGRESS NOTE   Subjective/Complaints:  Pt reports doing well- is wondering if neck tightening at night is due to an allergic rxn- has a light rash on chest and back- not itching- wife thought could be that and swelling could be from allergy to night medicine.    Doesn't have rash in groin/lower abd which is usually where rash occurs from drug rash- chest/back rash is usually contact dermatitis- explained to pt.   Will ask for hypoallergenic sheets-   ROS:   Pt denies SOB, abd pain, CP, N/V/C/D, and vision changes    Objective:   No results found. No results for input(s): WBC, HGB, HCT, PLT in the last 72 hours. No results for input(s): NA, K, CL, CO2, GLUCOSE, BUN, CREATININE, CALCIUM in the last 72 hours.  Intake/Output Summary (Last 24 hours) at 08/29/2020 0917 Last data filed at 08/29/2020 0700 Gross per 24 hour  Intake 600 ml  Output 900 ml  Net -300 ml        Physical Exam: Vital Signs Blood pressure 104/67, pulse 70, temperature 97.8 F (36.6 C), temperature source Oral, resp. rate 16, height 5\' 11"  (1.803 m), weight 84.7 kg, SpO2 98 %.   General: sitting up in w/c at sink, OT in room, doing grooming activities, NAD Mood and affect are appropriate HENT: pulled off steristrips that were almost off- incision looks GREAT Heart: RRR_ no JVD Lungs: CTA B/L- no W/R/R- good air movement Abdomen: Soft, NT, ND, (+)BS  Extremities: No clubbing, cyanosis, or edema Skin: No evidence of breakdown, has fine trace rash on upper chest and upper back- none in groin/abd area- not raised- just red- appears to be contact dermatitis.  Neurological: better oriented - Ox3 LLE MAS of 2- much improved- no change again  ROM/spasticity  L>R hand clawing-/spasticity - hard to close hand on L completely due to this.  Motor: RUE: 5/5 proximal distal LUE: 4 -/5 proximal to distal RLE: 4 --4/5 proximal distal LLE: 3+-4 -/5 hip flexion, knee  extension, 2/5 ankle dorsiflexion Sensation diminished light touch LUE/LLE--- no change       Assessment/Plan: 1. Functional deficits which require 3+ hours per day of interdisciplinary therapy in a comprehensive inpatient rehab setting.  Physiatrist is providing close team supervision and 24 hour management of active medical problems listed below.  Physiatrist and rehab team continue to assess barriers to discharge/monitor patient progress toward functional and medical goals  Care Tool:  Bathing    Body parts bathed by patient: Right arm,Left arm,Chest,Abdomen,Front perineal area,Right upper leg,Left upper leg,Right lower leg,Left lower leg,Face,Buttocks   Body parts bathed by helper: Buttocks     Bathing assist Assist Level: Supervision/Verbal cueing     Upper Body Dressing/Undressing Upper body dressing   What is the patient wearing?: Pull over shirt,Orthosis    Upper body assist Assist Level: Supervision/Verbal cueing    Lower Body Dressing/Undressing Lower body dressing      What is the patient wearing?: Pants     Lower body assist Assist for lower body dressing: Supervision/Verbal cueing     Toileting Toileting    Toileting assist Assist for toileting: Supervision/Verbal cueing     Transfers Chair/bed transfer  Transfers assist     Chair/bed transfer assist level: Contact Guard/Touching assist Chair/bed transfer assistive device:   Ambulation assist      Assist level: Minimal Assistance - Patient > 75% Assistive device: Walker-rolling Max distance: 15  Walk 10 feet activity   Assist     Assist level: Minimal Assistance - Patient > 75% Assistive device: Walker-rolling   Walk 50 feet activity   Assist Walk 50 feet with 2 turns activity did not occur: Safety/medical concerns  Assist level: Minimal Assistance - Patient > 75% Assistive device: Walker-rolling    Walk 150 feet activity   Assist  Walk 150 feet activity did not occur: Safety/medical concerns         Walk 10 feet on uneven surface  activity   Assist Walk 10 feet on uneven surfaces activity did not occur: Safety/medical concerns         Wheelchair     Assist Will patient use wheelchair at discharge?: No Type of Wheelchair: Manual    Wheelchair assist level: Supervision/Verbal cueing Max wheelchair distance: 150    Wheelchair 50 feet with 2 turns activity    Assist        Assist Level: Supervision/Verbal cueing   Wheelchair 150 feet activity     Assist      Assist Level: Supervision/Verbal cueing   Blood pressure 104/67, pulse 70, temperature 97.8 F (36.6 C), temperature source Oral, resp. rate 16, height 5\' 11"  (1.803 m), weight 84.7 kg, SpO2 98 %.  Medical Problem List and Plan: 1.  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             -ELOS/Goals: 14-18 days/supervision/min a  1/22- MAY shower- just cover incision  1/24: plan to wean decadron?  1/25- decrease decadron to 2 mg BID x 3 days then QHS x3 days then stop  2/1- team cofnerence today to finalize d/c plans- wants [facility] in [place], on [address]   2/3- d/c tomorrow 10-12 pm             -Continue CIR therapies including PT, OT  Improved 2.  Antithrombotics: -DVT/anticoagulation:  Mechanical: Sequential compression devices, below knee Bilateral lower extremities  1/22- started lovenox  2/1- will check with therapy if walking far enough to go home off Lovenox?  2/2- needs lovenox at d/c- trained to do.              -antiplatelet therapy: N/a 3. Pain Management: On oxycodone prn.    1/26- pain controlled- spasticity a little better as below- con't regimen  1/27- muscle tightness in naterior neck bothersome- muscle relaxer helps- con't regimen  2/1- the choking feeling likely edema- con't regimen for pain.  2/2- not taking  even tylenol for pain- so won't need pain meds at d/c.              Monitor with increased exertion, particularly for neuropathic pain 4. Mood: LCSW to follow for evaluation and support.              -antipsychotic agents: N/A 5. Neuropsych: This patient is capable of making decisions on his own behalf. 6. Skin/Wound Care: Routine pressure relief measures 7. Fluids/Electrolytes/Nutrition: Monitor I/Os             1/24. Hyponatremia of 126 (136 08/15/20), normal BUN/Cr/K+   -hold HCTZ (already received today)   -recheck bmet tomorrow  1/25- Na down to 124 today- off HCTZ- since BUN also up to 26- will give NS IVFs 75cc hr x 1 days and BP also somewhat low- could be worse due to steroids which I'm tapering.   1/26- pt also admits has been drinking a LOT due  to dry mouth- suggested gum/ice/hard candy to help dry mouth since drinking too much  1/27- Na up to 131 and BUN down to 18- doing much better- will recheck in AM since off IVFs  1/28- labs Monday with improvement in labs- if Symptomatic, recheck BMP  1/31- Na up to 132- so can come off IVFs/IV- BUN down to 17- improved 8. HTN: Monitor BP tid--continue HCTZ and Avapro. Monitor for orthostasis.    Vitals:   08/29/20 0529 08/29/20 0708  BP: 104/67   Pulse: 70   Resp: 16   Temp: 97.8 F (36.6 C)   SpO2: 98% 98%  Controlled on Avapro, off hydrochlorothiazide Flomax and tizanidine may be contributing to lower BPs, no dizziness or other symptoms 2/1- BP a little low/soft- denies orthostasis- con't regimen 2/3= BP still soft/ on low side, but no orthostatic symptoms- con't regimen 9. GERD: Resume PPI.   1/22- increased to BID per pt request from Sx's.  10. Hypothyroid: Continue supplement 11. COPD: Encourage pulmonary hygiene with IS and monitor respiratory status with increase in activity. Continue Dulera with albuterol prn SOB. .  12. Spasticity: Baclofen tid-->may need titration upwards to help manage symptoms.   1/20- will increase  Baclofen to 10 mg QID and then add prn Zanaflex 4 mg TID prn for increasing spasms, esp at night- explained can cause sedation/constipaiton. Of note, spasticity worse at night.   1/21- forgot to take zanaflex- will write to offer it- slept better with less cramping- will increase Baclofen Sat/Sunday  1/22- forgetting Zanaflex- will scheduled 4 mg QHS as well as baclofen  1/23-24- spasticity improving- once discharged, can possibly arrange for Botox toxin after d/c. Titrate tizanidine further as needed  1/28- spasticity slightly better in LLE- will hold off making changes today, but if need be, can increase Baclofen OR Zanaflex over the weekend.   1/31- Spasticity- MAS down to 2- much improved- con't regimen  2/1- L hand also affected, but spasticity meds working great  2/2- doing great- only cause of pain- con't regimen 13. Leukocytosis: likely steroids  1/20- WBC 13.4- but on decadron- will check U/A and Cx to make sure doesn't have UTI.    1/21- U/A (-)- is from decadron  1/22- questionable dysuria last night- pt denied?  1/23- pt denies dysuria today and hematuria- but sounds like he had yesterday based on his language now.  1/26- weaning Steroids to help with leukocytosis.  1/27- WBC 11.2- doing better with decrease in steroids  14. Frequent urination  1/21- hx of prostate enlargement- will start Flomax -.4 mg q supper- since sounds like not emptying.  1/23- peeing ~350cc at a time- so less often- con't flomax  1/27- Also on finesteride 5mg  QHS- per Urology- is voiding/emptying better per pt, but still has urgency.   1/28- urgency better- con't regimen  1/31- urgency almost resolved- con't both meds 15. Hematuria  1/25- documented in chart x 2- U/A on 1/20 was (-) for hematuria/blood in urine- will call Urology to see what the next step would be.   1/27- On finesteride- no more hematuria so far- con't to monitor 16. Hyponatremia  1/25- down to 124- will give IVFs NS 75cc/hr - could have  been due to HCTZ? Which has stopped or Decadron, which I'm tapering- will monitor.   1/26- will check CBC and BMP in AM- suggest not drinking so much so can improve Na and as wean steroids, should also help.   1/27- Na up to 131 and BUN  odwn to 18- off IVFs- con't regimen 17. Constipation  1/27- will order sorbitol x1 at 1400  1/28- will give another dose of sorbitol this AM and schedule suppository ~ 1500 after therapy to get him t go- also added Miralax daily for bowels.   2/1- LBM yesterday doing better  2/2- made miralax prn- since having a lot of BMs 18. Insomnia  1/27- will schedule trazodone 50 mg QHS.   1/30 slept well last noc  2/3- Trazodone helps, but cannot sleep well on back due to swelling of throat area- makes him feel like cannot breathe- think due to prevertebral swelling 19. Rash- contact dermatitis.   2/3- will switch to hypoallergenic sheets   LOS: 15 days A FACE TO FACE EVALUATION WAS PERFORMED  Megan Lovorn 08/29/2020, 9:17 AM

## 2020-08-29 NOTE — Discharge Instructions (Signed)
Inpatient Rehab Discharge Instructions  Nicandro Perrault Discharge date and time:  08/30/20  Activities/Precautions/ Functional Status: Activity: no lifting, driving, or strenuous exercise till cleared by MD Diet: regular diet Wound Care: keep wound clean and dry.   Functional status:  ___ No restrictions     ___ Walk up steps independently _X__ 24/7 supervision/assistance   ___ Walk up steps with assistance ___ Intermittent supervision/assistance  ___ Bathe/dress independently ___ Walk with walker     ___ Bathe/dress with assistance ___ Walk Independently    ___ Shower independently ___ Walk with assistance    _X__ Shower with assistance _X__ No alcohol     ___ Return to work/school ________  Special Instructions:    My questions have been answered and I understand these instructions. I will adhere to these goals and the provided educational materials after my discharge from the hospital.  Patient/Caregiver Signature _______________________________ Date __________  Clinician Signature _______________________________________ Date __________  Please bring this form and your medication list with you to all your follow-up doctor's appointments.

## 2020-08-29 NOTE — Progress Notes (Signed)
Occupational Therapy Session Note  Patient Details  Name: Keith Downs MRN: [id number] Date of Birth: 1960-09-08  Today's Date: 08/29/2020 OT Individual Time: 0109-3235 OT Individual Time Calculation (min): 30 min    Short Term Goals: Week 2:  OT Short Term Goal 1 (Week 2): STG=LTG secondary to ELOS  Skilled Therapeutic Interventions/Progress Updates:    Pt resting in recliner upon arrival. Pt amb with RW from recliner to w/c and propelled to day room. Pt engaged in standing task at table to assemble pvc pipe tree structure. Min verbal cues for standing posture and L knee extension. Pt sat back in w/c and completed colored peg board task using LUE/hand. Pt donned gloves and cleaned all equipment. Pt propelled back to room and amb with RW to recliner. Pt remained in recliner with all needs within reach and seat alarm activated.   Therapy Documentation Precautions:  Precautions Precautions: Cervical,Fall Required Braces or Orthoses: Cervical Brace Cervical Brace: Hard collar Restrictions Weight Bearing Restrictions: No Other Position/Activity Restrictions: Hard collar when OOB, may apply sitting EOB   Pain:  Pt denies pain this morning   Therapy/Group: Individual Therapy  Rich Brave 08/29/2020, 12:12 PM

## 2020-08-29 NOTE — Progress Notes (Signed)
Physical Therapy Session Note  Patient Details  Name: Keith Downs MRN: [id number] Date of Birth: [birth year]  Today's Date: 08/29/2020 PT Individual Time: 0905-1005 and 1400-1445 PT Individual Time Calculation (min): 60 min and 45 min  Short Term Goals: Week 2:  PT Short Term Goal 1 (Week 2): = to LTGs based on ELOS  Skilled Therapeutic Interventions/Progress Updates: Pt presented in recliner agreeable to therapy. Pt denies pain throughout session. Performed ambulatory transfer to w/c with RW and supervision. Pt propelled supervision to ortho gym and performed car transfer with minA (pt required assist to pull LLE into vehicle). Pt then propelled to rehab gym and participated in stairs ascending/descending x 4 with 1 rail and CGA. Ambulated on uneven surface x 74f with RW and CGA. Pt did note to have increased fatigue after ambulation with noted difficulty turning to L to sit in standard chair. During theraputic break discussed energy conservation upon d/c home and importance on taking appropriate breaks during activities. Pt propelled to day room and particiated in NuStep L5 x 5 min 4 extremities then x 5 min with BLE only. Pt propelled back to room once completed and performed ambulatory transfer back to room. Pt left in recliner at end of session with call bell within reach and needs met.   Tx2: Pt presented in recliner agreeable to therapy. Pt performed ambulatory transfer to w/c with PTA setting newly received w/c. Pt propelled to rehab gym and participated in ambulation around rehab gym ~524fwith CGA and pt taking x 2 brief standing rest breaks for repositioning and recovery. Pt also participated in dynamic standing balance participating in ball kicks alternating LE with pt demonstrating good stability and x 1 posterior LOB which pt was able to correct without assist. Pt also participated in dynamic reaching alternating UE including crossing midline. Pt with no LOB with that activity. Pt  propelled to ADL apt and performed bed mobility requiring minA on sit to supine for LLE management but was able to perform supine to sit from standard bed with supervision. Pt propelled back to room and performed ambulatory transfer to recliner. Pt left in recliner at end of session with call bell within reach and needs met.      Therapy Documentation Precautions:  Precautions Precautions: Cervical,Fall Required Braces or Orthoses: Cervical Brace Cervical Brace: Hard collar Restrictions Weight Bearing Restrictions: No Other Position/Activity Restrictions: Hard collar when OOB, may apply sitting EOB General:   Vital Signs: Therapy Vitals Temp: 97.8 F (36.6 C) Pulse Rate: 84 Resp: 17 BP: (!) 103/46 Patient Position (if appropriate): Sitting Oxygen Therapy SpO2: 100 % O2 Device: Room Air Pain:   Mobility:   Locomotion :    Trunk/Postural Assessment :    Balance: Balance Balance Assessed: Yes Static Sitting Balance Static Sitting - Balance Support: Feet supported Static Sitting - Level of Assistance: 7: Independent Dynamic Sitting Balance Dynamic Sitting - Balance Support: Feet supported Dynamic Sitting - Level of Assistance: 6: Modified independent (Device/Increase time) Dynamic Sitting - Balance Activities: Reaching for objects Static Standing Balance Static Standing - Balance Support: Bilateral upper extremity supported Static Standing - Level of Assistance: 5: Stand by assistance Dynamic Standing Balance Dynamic Standing - Balance Support: During functional activity Dynamic Standing - Level of Assistance: 5: Stand by assistance Dynamic Standing - Balance Activities: Kicking ball;Reaching for objects;Reaching across midline Exercises:   Other Treatments:      Therapy/Group: Individual Therapy  Dillon Livermore 08/29/2020, 4:14 PM

## 2020-08-29 NOTE — Discharge Summary (Signed)
Physician Discharge Summary  Patient ID: Keith Downs MRN: [id number] DOB/AGE: 61-Aug-1962 61 y.o.  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 [facility])   Essential hypertension   Benign prostatic hyperplasia with urinary frequency   Hyponatremia   Discharged Condition: stable   Significant Diagnostic Studies: N/a   Labs:  Basic Metabolic Panel: BMP Latest Ref Rng & Units 08/26/2020 08/22/2020 08/20/2020  Glucose 70 - 99 mg/dL 114(H) 133(H) 120(H)  BUN 6 - 20 mg/dL 17 18 26(H)  Creatinine 0.61 - 1.24 mg/dL 0.73 0.83 0.94  Sodium 135 - 145 mmol/L 132(L) 131(L) 124(L)  Potassium 3.5 - 5.1 mmol/L 4.5 4.8 4.1  Chloride 98 - 111 mmol/L 98 99 87(L)  CO2 22 - 32 mmol/L $RemoveB'25 24 26  'svDPfNEj$ Calcium 8.9 - 10.3 mg/dL 8.6(L) 8.5(L) 9.1    CBC: CBC Latest Ref Rng & Units 08/26/2020 08/22/2020 08/19/2020  WBC 4.0 - 10.5 K/uL 9.7 11.4(H) 13.5(H)  Hemoglobin 13.0 - 17.0 g/dL 13.0 13.1 14.8  Hematocrit 39.0 - 52.0 % 38.5(L) 37.7(L) 41.7  Platelets 150 - 400 K/uL 160 185 235    CBG: No results for input(s): GLUCAP in the last 168 hours.  Brief HPI:   Keith Downs is a 60 y.o. male with history of HTN, BPH, valvular heart disease, left foot drop, progressive numbness and weakness with problems ambulating due to HNP C3/4 and C4/5 with C5/6 myelopathy.  He was admitted on 08/09/2020 for ACDF C3-C 6 with arthrodesis of C3--C6 by Dr. Ronnald Ramp.  Postop in PACU he was found to have LUE>RUE weakness, flaccid LLE and RLE weakness with increased tone.  MRI C-spine was negative for hematoma and showed interval decompression with improvement in spinal stenosis.  Weakness was felt to be due to coronary expansion after severe compression and he was started on Decadron with slow improvement in strength.  Baclofen was added for  spasticity and Foley placed due to difficulty voiding with neurogenic bladder.  He continues to have limitations due to ataxia with BLE weakness, DOE as well as LUE weakness affecting ADLs and mobility.  CIR was recommended due to functional decline.   Hospital Course: Keith Downs was admitted to rehab 08/14/2020 for inpatient therapies to consist of PT and OT at least three hours five days a week. Past admission physiatrist, therapy team and rehab RN have worked together to provide customized collaborative inpatient rehab.  His blood pressures were monitored on TID basis and and has been relatively controlled without orthostasis.  PPI was resumed due to history of GERD.  Respiratory status has been stable on home regimen.  Subcu Lovenox was added for DVT prophylaxis on postop day 7 and he is to continue on subcu Lovenox due to ongoing weakness and limited ambulation.  Follow-up CBC done showing evidence of leukocytosis.  UA urine culture was added to rule out infection as cause and was negative.  Decadron was weaned off during his stay and reactive leukocytosis/hyperglycemia is resolving.    Pain control has improved.  Bowel program has been augmented during the stay with good results.  He had been refusing laxatives prior to discharge and has been educated on using this on a as needed basis to avoid recurrent constipation at home.  Serial check of electrolytes showed evidence of prerenal azotemia and he was encouraged to increase fluid intake.  Follow-up check of electrolytes showed hyponatremia which is slowly resolving and AKI has resolved..  Baclofen was titrated to 10 mg p.o. 4 times daily and Zanaflex added at bedtime in addition to as needed doses to be used during the day to help manage spasticity.  He has had issues with frequency and Flomax was added at bedtime in addition to Proscar with improvement in voiding function.  He is to follow-up with his GU for further input after discharge.  He  has made steady progress during his stay and is currently at supervision level.  He will continue to receive follow-up outpatient PT and OT at [facility] in [place] after discharge   Rehab course: During patient's stay in rehab weekly team conferences were held to monitor patient's progress, set goals and discuss barriers to discharge. At admission, patient required mod assist with mobility and with ADL tasks. He  has had improvement in activity tolerance, balance, postural control as well as ability to compensate for deficits. Hee has had improvement in functional use RUE/LUE  and RLE/LLE as well as improvement in awareness and coordination. He is able to complete ADL tasks with supervision.  He requires min assist for donning TED hose and AFO and contact-guard assist for shower transfers. He requires supervision for transfers and is able to ambulate up to 56 feet with contact-guard assist and use of rolling walker.  Family education was completed with wife.  Disposition: Home  Diet: Regular  Special Instructions: 1.  Recommend repeat be met in 1 to 2 weeks to monitor sodium. 2.  No driving or strenuous activity till cleared by MD.  Big collar when out of bed.    Allergies as of 08/30/2020      Reactions   Niacin And Related    Face flushed      Medication List    STOP taking these medications   ibuprofen 800 MG tablet Commonly known as: ADVIL   methocarbamol 500 MG tablet Commonly known as: Robaxin   olmesartan-hydrochlorothiazide 20-12.5 MG tablet Commonly known as: BENICAR HCT   oxyCODONE-acetaminophen 5-325 MG tablet Commonly known as: Percocet     TAKE these medications   albuterol 108 (90 Base) MCG/ACT inhaler Commonly known as: VENTOLIN HFA Inhale 2 puffs into the lungs every 6 (six) hours as needed for wheezing or shortness of breath.   aspirin EC 81 MG tablet Take 81 mg by mouth daily. Swallow whole.   baclofen 10 MG tablet Commonly known as: LIORESAL Take 1  tablet (10 mg total) by mouth 4 (four) times daily.   budesonide-formoterol 80-4.5 MCG/ACT inhaler Commonly known as: SYMBICORT Inhale 2 puffs into the lungs 2 (two) times daily as needed (shortness of breath).   enoxaparin 40 MG/0.4ML injection Commonly known as: LOVENOX Inject 0.4 mLs (40 mg total) into the skin daily.   finasteride 5 MG tablet Commonly known as: PROSCAR Take 1 tablet (5 mg total) by mouth at bedtime.   fluticasone 50 MCG/ACT nasal spray Commonly known as: FLONASE Place 2 sprays into both nostrils daily.   gabapentin 300 MG capsule Commonly known as: NEURONTIN Take 1 capsule (300 mg total) by mouth 3 (three) times daily.   irbesartan 75 MG tablet Commonly known as: AVAPRO Take 1 tablet (75 mg total) by mouth daily.   levothyroxine 125 MCG tablet Commonly known as: SYNTHROID Take 125 mcg by mouth daily before breakfast.   multivitamin with minerals Tabs tablet Take 1 tablet by mouth daily.   omeprazole 40 MG  capsule Commonly known as: PRILOSEC Take 40 mg by mouth daily.   polyethylene glycol 17 g packet Commonly known as: MIRALAX / GLYCOLAX Take 17 g by mouth daily as needed for mild constipation.   rosuvastatin 10 MG tablet Commonly known as: CRESTOR Take 10 mg by mouth daily.   senna-docusate 8.6-50 MG tablet Commonly known as: Senokot-S Take 1 tablet by mouth daily as needed for mild constipation. Use if no BM in 24 hours   tamsulosin 0.4 MG Caps capsule Commonly known as: FLOMAX Take 1 capsule (0.4 mg total) by mouth daily after supper.   tiZANidine 4 MG tablet Commonly known as: ZANAFLEX Take 1 tablet (4 mg total) by mouth every 8 (eight) hours as needed for muscle spasms (refractory to current baclofen.). Take one at bedtime daily   traMADol 50 MG tablet--Rx # 14 pills Commonly known as: ULTRAM Take 1 tablet (50 mg total) by mouth every 12 (twelve) hours as needed for severe pain.   traZODone 50 MG tablet Commonly known as:  DESYREL Take 1 tablet (50 mg total) by mouth at bedtime.       Follow-up Information    Lovorn, Jinny Blossom, MD Follow up.   Specialty: Physical Medicine and Rehabilitation Why: Office will call you with follow up appointment Contact information: [address] [place] [address] [phone]        Eustace [place], MD. Call.   Specialty: Neurosurgery Why: for post op check Contact information: [address] [address] [phone]        [facility]. Call.   Why: for post hospital follow up Contact information: [place] [address] [phone]               Signed: Bary Leriche 09/01/2020, 5:20 PM

## 2020-08-30 NOTE — Progress Notes (Signed)
Pt discharge home with family. Denies pain or discomfort. DC instructions given by Elita Quick, PA. No further question from pt or family. Belongings sent with pt.   Marylu Lund, RN

## 2020-08-30 NOTE — Progress Notes (Signed)
Patient self administered Lovenox injection today, with nurse observing.

## 2020-08-30 NOTE — Progress Notes (Signed)
Inpatient Rehabilitation Care Coordinator Discharge Note  The overall goal for the admission was met for:   Discharge location: Yes. D/c to home with 24/7 care from wife.  Length of Stay: Yes. 15 days.   Discharge activity level: Yes. Supervision.   Home/community participation: Yes. Limited.   Services provided included: MD, RD, PT, OT, RN, CM, TR, Pharmacy, Neuropsych and SW  Financial Services: Private Insurance: BCBS  Choices offered to/list presented to:yes  Follow-up services arranged: Outpatient: [facility] for PT/OT and DME: [facility] for w/c  Comments (or additional information):  Patient/Family verbalized understanding of follow-up arrangements: Yes  Individual responsible for coordination of the follow-up plan: pt [phone] or pt wife [name] [phone]  Confirmed correct DME delivered: Rana Snare 08/30/2020    Rana Snare

## 2020-08-30 NOTE — Progress Notes (Signed)
[facility] PHYSICAL MEDICINE & REHABILITATION PROGRESS NOTE   Subjective/Complaints:  Pt reports LBM this AM.  Some medicine helped the swelling discomfort overnight so slept better.   Ready for d/c- wife coming to pick him up. Will be 11am to 1pm due to distance Rx's already sent to pharmacy in [place]-   ROS:   Pt denies SOB, abd pain, CP, N/V/C/D, and vision changes     Objective:   No results found. No results for input(s): WBC, HGB, HCT, PLT in the last 72 hours. No results for input(s): NA, K, CL, CO2, GLUCOSE, BUN, CREATININE, CALCIUM in the last 72 hours.  Intake/Output Summary (Last 24 hours) at 08/30/2020 0826 Last data filed at 08/30/2020 0700 Gross per 24 hour  Intake 600 ml  Output 3250 ml  Net -2650 ml        Physical Exam: Vital Signs Blood pressure 108/64, pulse 68, temperature 97.6 F (36.4 C), resp. rate 14, height 5\' 11"  (1.803 m), weight 84.7 kg, SpO2 98 %.   General: sitting up in bed side chair- ready for d/c, NAD Mood and affect are happy HENT: steristrips off- look great- almost completely healed Heart: RRR Lungs: CTA B/L- no W/R/R- good air movementt Abdomen: Soft, NT, ND, (+)BS  Extremities: No clubbing, cyanosis, or edema Skin: No evidence of breakdown, has fine trace rash on upper chest and upper back- none in groin/abd area- not raised- just red- appears to be contact dermatitis.  Neurological: better oriented - Ox3 LLE MAS of 2- much improved- no change  ROM/spasticity  L>R hand clawing-/spasticity - hard to close hand on L completely due to this.  Motor: RUE: 5/5 proximal distal LUE: 4 -/5 proximal to distal RLE: 4 --4/5 proximal distal LLE: 3+-4 -/5 hip flexion, knee extension, 2/5 ankle dorsiflexion Sensation diminished light touch LUE/LLE--- no change       Assessment/Plan: 1. Functional deficits which require 3+ hours per day of interdisciplinary therapy in a comprehensive inpatient rehab setting.  Physiatrist is  providing close team supervision and 24 hour management of active medical problems listed below.  Physiatrist and rehab team continue to assess barriers to discharge/monitor patient progress toward functional and medical goals  Care Tool:  Bathing    Body parts bathed by patient: Right arm,Left arm,Chest,Abdomen,Front perineal area,Right upper leg,Left upper leg,Right lower leg,Left lower leg,Face,Buttocks   Body parts bathed by helper: Buttocks     Bathing assist Assist Level: Supervision/Verbal cueing     Upper Body Dressing/Undressing Upper body dressing   What is the patient wearing?: Pull over shirt,Orthosis    Upper body assist Assist Level: Supervision/Verbal cueing    Lower Body Dressing/Undressing Lower body dressing      What is the patient wearing?: Pants     Lower body assist Assist for lower body dressing: Supervision/Verbal cueing     Toileting Toileting    Toileting assist Assist for toileting: Supervision/Verbal cueing     Transfers Chair/bed transfer  Transfers assist     Chair/bed transfer assist level: Contact Guard/Touching assist Chair/bed transfer assistive device:   Ambulation assist      Assist level: Minimal Assistance - Patient > 75% Assistive device: Walker-rolling Max distance: 15   Walk 10 feet activity   Assist     Assist level: Minimal Assistance - Patient > 75% Assistive device: Walker-rolling   Walk 50 feet activity   Assist Walk 50 feet with 2 turns activity did not occur: Safety/medical concerns  Assist level: Minimal Assistance - Patient > 75% Assistive device: Walker-rolling    Walk 150 feet activity   Assist Walk 150 feet activity did not occur: Safety/medical concerns         Walk 10 feet on uneven surface  activity   Assist Walk 10 feet on uneven surfaces activity did not occur: Safety/medical concerns         Wheelchair     Assist Will patient  use wheelchair at discharge?: No Type of Wheelchair: Manual    Wheelchair assist level: Supervision/Verbal cueing Max wheelchair distance: 150    Wheelchair 50 feet with 2 turns activity    Assist        Assist Level: Supervision/Verbal cueing   Wheelchair 150 feet activity     Assist      Assist Level: Supervision/Verbal cueing   Blood pressure 108/64, pulse 68, temperature 97.6 F (36.4 C), resp. rate 14, height 5\' 11"  (1.803 m), weight 84.7 kg, SpO2 98 %.  Medical Problem List and Plan: 1.  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             -ELOS/Goals: 14-18 days/supervision/min a  1/22- MAY shower- just cover incision  1/24: plan to wean decadron?  1/25- decrease decadron to 2 mg BID x 3 days then QHS x3 days then stop  2/1- team cofnerence today to finalize d/c plans- wants [facility] [place], on [address]   2/3- d/c tomorrow 10-12 pm  2/4- d/c today- wife coming from [place], [place]             -Continue CIR therapies including PT, OT  Improved 2.  Antithrombotics: -DVT/anticoagulation:  Mechanical: Sequential compression devices, below knee Bilateral lower extremities  1/22- started lovenox  2/1- will check with therapy if walking far enough to go home off Lovenox?  2/2- needs lovenox at d/c- trained to do.              -antiplatelet therapy: N/a 3. Pain Management: On oxycodone prn.    1/26- pain controlled- spasticity a little better as below- con't regimen  1/27- muscle tightness in naterior neck bothersome- muscle relaxer helps- con't regimen  2/1- the choking feeling likely edema- con't regimen for pain.  2/2- not taking even tylenol for pain- so won't need pain meds at d/c.              Monitor with increased exertion, particularly for neuropathic pain 4. Mood: LCSW to follow for evaluation and support.              -antipsychotic agents: N/A 5.  Neuropsych: This patient is capable of making decisions on his own behalf. 6. Skin/Wound Care: Routine pressure relief measures 7. Fluids/Electrolytes/Nutrition: Monitor I/Os             1/24. Hyponatremia of 126 (136 08/15/20), normal BUN/Cr/K+   -hold HCTZ (already received today)   -recheck bmet tomorrow  1/25- Na down to 124 today- off HCTZ- since BUN also up to 26- will give NS IVFs 75cc hr x 1 days and BP also somewhat low- could be worse due to steroids which I'm tapering.   1/26- pt also admits has been drinking a LOT due to dry mouth- suggested gum/ice/hard candy to help dry mouth since drinking too much  1/27- Na up to 131 and BUN down to 18- doing much better- will recheck in AM since off IVFs  1/28- labs  Monday with improvement in labs- if Symptomatic, recheck BMP  1/31- Na up to 132- so can come off IVFs/IV- BUN down to 17- improved 8. HTN: Monitor BP tid--continue HCTZ and Avapro. Monitor for orthostasis.    Vitals:   08/30/20 0611 08/30/20 0713  BP: 108/64   Pulse: 68   Resp: 14   Temp: 97.6 F (36.4 C)   SpO2: 100% 98%  Controlled on Avapro, off hydrochlorothiazide Flomax and tizanidine may be contributing to lower BPs, no dizziness or other symptoms 2/1- BP a little low/soft- denies orthostasis- con't regimen 2/3= BP still soft/ on low side, but no orthostatic symptoms- con't regimen  2/4- BP still soft- likely Zanaflex? Con't regimen since no Sx's.  9. GERD: Resume PPI.   1/22- increased to BID per pt request from Sx's.  10. Hypothyroid: Continue supplement 11. COPD: Encourage pulmonary hygiene with IS and monitor respiratory status with increase in activity. Continue Dulera with albuterol prn SOB. .  12. Spasticity: Baclofen tid-->may need titration upwards to help manage symptoms.   1/20- will increase Baclofen to 10 mg QID and then add prn Zanaflex 4 mg TID prn for increasing spasms, esp at night- explained can cause sedation/constipaiton. Of note, spasticity worse  at night.   1/21- forgot to take zanaflex- will write to offer it- slept better with less cramping- will increase Baclofen Sat/Sunday  1/22- forgetting Zanaflex- will scheduled 4 mg QHS as well as baclofen  1/23-24- spasticity improving- once discharged, can possibly arrange for Botox toxin after d/c. Titrate tizanidine further as needed  1/28- spasticity slightly better in LLE- will hold off making changes today, but if need be, can increase Baclofen OR Zanaflex over the weekend.   1/31- Spasticity- MAS down to 2- much improved- con't regimen  2/1- L hand also affected, but spasticity meds working great  2/2- doing great- only cause of pain- con't regimen 13. Leukocytosis: likely steroids  1/20- WBC 13.4- but on decadron- will check U/A and Cx to make sure doesn't have UTI.    1/21- U/A (-)- is from decadron  1/22- questionable dysuria last night- pt denied?  1/23- pt denies dysuria today and hematuria- but sounds like he had yesterday based on his language now.  1/26- weaning Steroids to help with leukocytosis.  1/27- WBC 11.2- doing better with decrease in steroids  14. Frequent urination  1/21- hx of prostate enlargement- will start Flomax -.4 mg q supper- since sounds like not emptying.  1/23- peeing ~350cc at a time- so less often- con't flomax  1/27- Also on finesteride 5mg  QHS- per Urology- is voiding/emptying better per pt, but still has urgency.   1/28- urgency better- con't regimen  1/31- urgency almost resolved- con't both meds 15. Hematuria  1/25- documented in chart x 2- U/A on 1/20 was (-) for hematuria/blood in urine- will call Urology to see what the next step would be.   1/27- On finesteride- no more hematuria so far- con't to monitor 16. Hyponatremia  1/25- down to 124- will give IVFs NS 75cc/hr - could have been due to HCTZ? Which has stopped or Decadron, which I'm tapering- will monitor.   1/26- will check CBC and BMP in AM- suggest not drinking so much so can improve Na  and as wean steroids, should also help.   1/27- Na up to 131 and BUN odwn to 18- off IVFs- con't regimen 17. Constipation  1/27- will order sorbitol x1 at 1400  1/28- will give another dose of sorbitol  this AM and schedule suppository ~ 1500 after therapy to get him t go- also added Miralax daily for bowels.   2/1- LBM yesterday doing better  2/2- made miralax prn- since having a lot of BMs  2.4- LBM this AM 18. Insomnia  1/27- will schedule trazodone 50 mg QHS.   1/30 slept well last noc  2/3- Trazodone helps, but cannot sleep well on back due to swelling of throat area- makes him feel like cannot breathe- think due to prevertebral swelling 19. Rash- contact dermatitis.   2/3- will switch to hypoallergenic sheets   LOS: 16 days A FACE TO FACE EVALUATION WAS PERFORMED  Keith Downs 08/30/2020, 8:26 AM

## 2020-09-11 ENCOUNTER — Other Ambulatory Visit: Payer: Self-pay | Admitting: Physical Medicine and Rehabilitation

## 2020-09-11 ENCOUNTER — Telehealth: Payer: Self-pay | Admitting: *Deleted

## 2020-09-11 NOTE — Telephone Encounter (Signed)
Keith Downs is not a heavy gentleman at all- and OT inpt ordered the correct size, based on his size/weight-  The insurance will not pay for an extra large commode unless he's over 300 or 350 lbs minimum- he is not in that weight group. Of course, he's more than free to order it himself from [place], etc, but insurance will not pay for it- I'm sorry.

## 2020-09-11 NOTE — Telephone Encounter (Signed)
Keith Downs called to request a new extra wide commode seat.  The one he has is too small.

## 2020-09-12 NOTE — Telephone Encounter (Signed)
Notified Mr Nishi.

## 2020-09-20 ENCOUNTER — Other Ambulatory Visit: Payer: Self-pay | Admitting: Physical Medicine and Rehabilitation

## 2020-09-27 ENCOUNTER — Other Ambulatory Visit: Payer: Self-pay | Admitting: Physical Medicine and Rehabilitation

## 2020-09-27 ENCOUNTER — Encounter: Payer: Self-pay | Admitting: Physical Medicine and Rehabilitation

## 2020-09-27 ENCOUNTER — Encounter
Payer: BC Managed Care – PPO | Attending: Physical Medicine and Rehabilitation | Admitting: Physical Medicine and Rehabilitation

## 2020-09-27 ENCOUNTER — Other Ambulatory Visit: Payer: Self-pay

## 2020-09-27 VITALS — BP 114/66 | HR 87 | Temp 99.0°F | Ht 71.0 in | Wt 205.0 lb

## 2020-09-27 DIAGNOSIS — R252 Cramp and spasm: Secondary | ICD-10-CM | POA: Insufficient documentation

## 2020-09-27 DIAGNOSIS — N319 Neuromuscular dysfunction of bladder, unspecified: Secondary | ICD-10-CM | POA: Insufficient documentation

## 2020-09-27 DIAGNOSIS — M5412 Radiculopathy, cervical region: Secondary | ICD-10-CM | POA: Diagnosis present

## 2020-09-27 DIAGNOSIS — G959 Disease of spinal cord, unspecified: Secondary | ICD-10-CM | POA: Diagnosis not present

## 2020-09-27 DIAGNOSIS — K592 Neurogenic bowel, not elsewhere classified: Secondary | ICD-10-CM | POA: Diagnosis not present

## 2020-09-27 DIAGNOSIS — G8250 Quadriplegia, unspecified: Secondary | ICD-10-CM | POA: Diagnosis not present

## 2020-09-27 MED ORDER — TIZANIDINE HCL 4 MG PO TABS: 8.0000 mg | ORAL_TABLET | Freq: Every day | ORAL | 5 refills | Status: AC

## 2020-09-27 MED ORDER — BACLOFEN 10 MG PO TABS: 15.0000 mg | ORAL_TABLET | Freq: Four times a day (QID) | ORAL | 5 refills | Status: DC

## 2020-09-27 MED ORDER — TAMSULOSIN HCL 0.4 MG PO CAPS: 0.4000 mg | ORAL_CAPSULE | Freq: Every day | ORAL | 5 refills | Status: AC

## 2020-09-27 MED ORDER — DIAZEPAM 5 MG PO TABS: 5.0000 mg | ORAL_TABLET | Freq: Once | ORAL | 0 refills | Status: AC

## 2020-09-27 MED ORDER — ENOXAPARIN SODIUM 40 MG/0.4ML ~~LOC~~ SOLN: 40.0000 mg | SUBCUTANEOUS | 0 refills | Status: DC

## 2020-09-27 NOTE — Patient Instructions (Signed)
Patient is a 60 yr old male with hx of Cervical myelopathy, spasticity, and Urinary retention/prostate issues as well/neurogenic bladder  And bowel here for hospital f/u. LE edema as well   1. Suggest 1/2 dose of Miralax every day to every other day.  Worst case scenario- a full dose of Miralax-   2. Can stop Stool softener.   3. Can stop Lovenox shots- in the tummy on 10/07/20. Will send in Rx for another 8 days (has 2 syringes at home).   4. Will try to increase Tizanidine first for increased spasms at night- if it doesn't work, have pt call me to increase Baclofen.   Can take 6-8 mg nightly for spasms- sent in 6 months.   5. Will try to change Baclofen to 15 mg 4x/day.  So 6 tabs/day is 180 tabs- sent to pharmacy with 6 months supply.   6. Suggest continuing Flonase daily for allergies that are going to start as an issue in the next 12 weeks; can use Symbicort as needed- but use if needs it.   7. Will give 2 doses of Valium 5 mg for MRI since don't want him spasming everywhere.    8.  Check your BP 1x/day now- unless you feel bad- then check again- , but can hold  Irbesartan for now- since BP running low 100s/60s-70s- based on checking 3x/day for last 4 weeks. Showed me graph- and stays around the same- denies orthostasis.  When sees PCP in May, might need to restart  Irbesartan- if shoots up, let me know.   9. Wean gabapentin to 300 mg nightly x 3 days, then stop. If nerve pain comes back or gets worse, call me.   10. Make trazodone AS NEEDED for sleep.  11. Not taking tramadol- doesn't need refill.    12. F/U in 6 weeks for f/u.

## 2020-09-27 NOTE — Progress Notes (Signed)
Subjective:    Patient ID: Keith Downs, male    DOB: 08-02-1960, 60 y.o.   MRN: [id number]  HPI  Patient is a 60 yr old male with hx of Cervical myelopathy, spasticity, and Urinary retention/prostate issues as well/neurogenic bladder here for hospital f/u. LE edema as well   Saw surgeon 2 weeks ago- will see again in 2 weeks.    Doing pretty well  Walking pretty well-   Using bathroom standing with RW.  Voiding well since since got home- using commode or urinal.  Still taking Flomax.   Taking stool softener- LBM yesterday- a little hard/hard to get out.  L hand and L foot will shake a little during the day-  At night, L hand really shaky at night.  Also having tightness and spasms as well during day. Could be a sign of getting tired.   Feet are a little swollen still-  Hard to elevate legs,  because when lays- still feels like choking when lays down/supine.     1 fall 2 weeks ago- is OK. Has to do MRI per Surgeon- 3/14    On Avapro for HTN;    Not sleeping well due to spasms.  On trazodone for sleep.    Pain Inventory Average Pain 3 Pain Right Now 3 My pain is intermittent and aching  LOCATION OF PAIN in the lower neck  BOWEL Number of stools per week: 3-7 Oral laxative use Yes  Type of laxative Senokot Enema or suppository use No  History of colostomy No  Incontinent No   BLADDER Normal In and out cath, frequency N/A Able to self cath No  Bladder incontinence No  Frequent urination No  Leakage with coughing No  Difficulty starting stream No  Incomplete bladder emptying No    Mobility walk without assistance use a walker how many minutes can you walk? 15-20 MINS ability to climb steps?  yes do you drive?  no Do you have any goals in this area?  yes  Function employed # of hrs/week Medical leave STD- Transport planner I need assistance with the following:  dressing, bathing, toileting, meal prep, household duties and shopping Do you  have any goals in this area?  yes  Neuro/Psych weakness numbness trouble walking spasms  Prior Studies Any changes since last visit?  yes Dr. Yetta Barre X-RAY  Physicians involved in your care Any changes since last visit?  no New Patient   Family History  Problem Relation Age of Onset  . Colon cancer Mother    Social History   Socioeconomic History  . Marital status: Married    Spouse name: Not on file  . Number of children: Not on file  . Years of education: Not on file  . Highest education level: Not on file  Occupational History  . Not on file  Tobacco Use  . Smoking status: Never Smoker  . Smokeless tobacco: Never Used  Vaping Use  . Vaping Use: Never used  Substance and Sexual Activity  . Alcohol use: Not Currently    Alcohol/week: 6.0 standard drinks    Types: 6 Cans of beer per week  . Drug use: Never  . Sexual activity: Yes  Other Topics Concern  . Not on file  Social History Narrative  . Not on file   Social Determinants of Health   Financial Resource Strain: Not on file  Food Insecurity: Not on file  Transportation Needs: Not on file  Physical Activity: Not on file  Stress: Not on file  Social Connections: Not on file   Past Surgical History:  Procedure Laterality Date  . ANTERIOR CERVICAL CORPECTOMY N/A 08/09/2020   Procedure: Corpectomy Cervical five with anterior plating Cervical three-Cervical six;  Surgeon: Tia Alert, MD;  Location: [facility] OR;  Service: Neurosurgery;  Laterality: N/A;  . ANTERIOR CERVICAL DECOMP/DISCECTOMY FUSION N/A 08/09/2020   Procedure: Anterior Cervical Decompression Fusion  - Cervical three-Cervical four;  Surgeon: Tia Alert, MD;  Location: [facility] OR;  Service: Neurosurgery;  Laterality: N/A;  . BACK SURGERY     L5-S1  . CARDIAC CATHETERIZATION  2019  . COLONOSCOPY     multiple  . cyst removal     from head  . HAND SURGERY  1982  . HEMORRHOID SURGERY    . HERNIA REPAIR Bilateral   . PROSTATE BIOPSY    . UPPER  GI ENDOSCOPY     multiple  . VEIN SURGERY  2019   Past Medical History:  Diagnosis Date  . Ambulates with cane   . Anemia   . Arthritis    back, knee  . Asthma   . BPH (benign prostatic hyperplasia)   . ED (erectile dysfunction)   . Foot drop, left    some in right foot  . GERD (gastroesophageal reflux disease)   . Headache   . History of hiatal hernia    patient denied this dx  . HLD (hyperlipidemia)   . Hypertension   . Hypothyroidism   . Neuromuscular disorder (HCC)    numbness in right hand  . Peripheral vascular disease (HCC)   . Seasonal allergies   . Valvular heart disease    followed by [facility] cardiology   BP 114/66   Pulse 87   Temp 99 F (37.2 C)   Ht 5\' 11"  (1.803 m)   Wt 205 lb (93 kg)   SpO2 99%   BMI 28.59 kg/m   Opioid Risk Score:   Fall Risk Score:  `1  Depression screen PHQ 2/9  Depression screen PHQ 2/9 09/27/2020  Decreased Interest 0  Down, Depressed, Hopeless 0  PHQ - 2 Score 0  Altered sleeping 1  Tired, decreased energy 0  Change in appetite 0  Feeling bad or failure about yourself  0  Trouble concentrating 0  Moving slowly or fidgety/restless 0  Suicidal thoughts 0  PHQ-9 Score 1   Review of Systems  Musculoskeletal: Positive for gait problem and neck pain.  All other systems reviewed and are negative.      Objective:   Physical Exam  Awake, alert, appropriate, in manual w/c, in cervical collar, accompanied by wife, NAD  MS: deltoid/biceps 5-/5, triceps 5-/5, WE 5-/5 on R; 4/5 on L; grip 4+/5 on R; 4-/5 on L; finger abd 2+/5 BL LEs- RLE_ HF 4/5, KE 4/5, DF 4/5 and PF 4/5 LLE- HF 3+/5, KE 3/5, DF 2-/5 and PF 3/5  Neuro: Hand spasming with testing-  Has R knee brace and L AFO in place  LE edema 0 LLE more swollen than RLE.  Wearing TED hose B/L    Assessment & Plan:    Patient is a 60 yr old male with hx of Cervical myelopathy, spasticity, and Urinary retention/prostate issues as well/neurogenic bladder  And  bowel here for hospital f/u. LE edema as well   1. Suggest 1/2 dose of Miralax every day to every other day.  Worst case scenario- a full dose of Miralax-   2. Can stop Stool  softener.   3. Can stop Lovenox shots- in the tummy on 10/07/20. Will send in Rx for another 8 days (has 2 syringes at home).   4. Will try to increase Tizanidine first for increased spasms at night- if it doesn't work, have pt call me to increase Baclofen.   Can take 6-8 mg nightly for spasms- sent in 6 months.   5. Will try to change Baclofen to 15 mg 4x/day.  So 6 tabs/day is 180 tabs- sent to pharmacy with 6 months supply.   6. Suggest continuing Flonase daily for allergies that are going to start as an issue in the next 12 weeks; can use Symbicort as needed- but use if needs it.   7. Will give 2 doses of Valium 5 mg for MRI since don't want him spasming everywhere.    8.  Check your BP 1x/day now- unless you feel bad- then check again- , but can hold  Irbesartan for now- since BP running low 100s/60s-70s- based on checking 3x/day for last 4 weeks. Showed me graph- and stays around the same- denies orthostasis.  When sees PCP in May, might need to restart  Irbesartan- if shoots up, let me know.   9. Wean gabapentin to 300 mg nightly x 3 days, then stop. If nerve pain comes back or gets worse, call me.   10. Make trazodone AS NEEDED for sleep.  11. Not taking tramadol- doesn't need refill.    12. F/U in 6 weeks for f/u.   I spent a total of 35 minutes on visit- as detailed above.

## 2020-10-04 ENCOUNTER — Telehealth: Payer: Self-pay

## 2020-10-04 NOTE — Telephone Encounter (Signed)
I called Keith Downs back about his Rx Lovenox cost. Patient stated he will call back if there a cost issue. As of now everything is okay.

## 2020-10-07 ENCOUNTER — Telehealth: Payer: Self-pay

## 2020-10-07 MED ORDER — TRAZODONE HCL 50 MG PO TABS: 50.0000 mg | ORAL_TABLET | Freq: Every day | ORAL | 5 refills | Status: AC

## 2020-10-07 NOTE — Addendum Note (Signed)
Addended by: Genice Rouge on: 10/07/2020 01:09 PM   Modules accepted: Orders

## 2020-10-07 NOTE — Telephone Encounter (Signed)
Keith Downs c/o  Getting only 2-3 hours of sleep nightly. Per patient he is up frequently because his hands shakes and back tightens at night.  Patient would like to go back on Trazodone.   Please advise or prescribe.  Call back ph# [phone].

## 2020-10-07 NOTE — Telephone Encounter (Signed)
Sent in trazodone per pt request 50-100 mg nightly- for sleep- thank you and sent 5 refills- please let pt know

## 2020-11-04 ENCOUNTER — Encounter: Payer: Self-pay | Admitting: Physical Medicine and Rehabilitation

## 2020-11-04 ENCOUNTER — Encounter
Payer: BC Managed Care – PPO | Attending: Physical Medicine and Rehabilitation | Admitting: Physical Medicine and Rehabilitation

## 2020-11-04 ENCOUNTER — Other Ambulatory Visit: Payer: Self-pay

## 2020-11-04 VITALS — BP 153/80 | HR 78 | Temp 98.3°F

## 2020-11-04 DIAGNOSIS — G8250 Quadriplegia, unspecified: Secondary | ICD-10-CM

## 2020-11-04 DIAGNOSIS — G959 Disease of spinal cord, unspecified: Secondary | ICD-10-CM

## 2020-11-04 DIAGNOSIS — R252 Cramp and spasm: Secondary | ICD-10-CM | POA: Diagnosis not present

## 2020-11-04 DIAGNOSIS — M5412 Radiculopathy, cervical region: Secondary | ICD-10-CM | POA: Diagnosis present

## 2020-11-04 DIAGNOSIS — K592 Neurogenic bowel, not elsewhere classified: Secondary | ICD-10-CM

## 2020-11-04 MED ORDER — BACLOFEN 20 MG PO TABS: 20.0000 mg | ORAL_TABLET | Freq: Four times a day (QID) | ORAL | 5 refills | Status: AC

## 2020-11-04 NOTE — Patient Instructions (Addendum)
Patient is a 60 yr old male with hx of Cervical myelopathy, spasticity, and Urinary retention/prostate issues as well/neurogenic bladder  And bowel here and LE edema.  Here for f/u on cervical myelopathy and severe/worsening spasticity.    1. Will increase Baclofen to 20 mg QID/4x/day.   2. ADD/ Take Senna - 1-2 tabs daily to help with constipation- can take either Senna (senokot is brand) or Miralax.  Or 1/2 dose Miralax q2 days Will have constipation on this dose of Baclofen.   3. Bump up Tizanidine to 8 mg (2 tabs) nightly for spasms-   4. Will wait for today on Dantrolene; but give me a call or Mychart in 2-3 weeks to let me know how things are going. And at that time, can start it- gradual onset medicine, so it will take ~ 2 weeks to notice improvement.   5. Need to speak with PCP about tremors of LUE- doesn't appear to be spasms to me- weights did help with therapy- might need referral to Neurology?  6. Off Gabapentin- make sure people know cannot take Gabapentin due to LE edema.   7. Pt would/qualifies for disability-  Pt is NOT able to work at this time- and not for the foreseeable future due to his incomplete quadriplegia and severe spasticity.    8. F/U in 6-8 weeks  Discussed spasticity agents, as well as other options- Valium didn't help much when took for MRI. Also discussed pt meets criteria for disability.

## 2020-11-04 NOTE — Progress Notes (Signed)
Subjective:    Patient ID: Keith Downs, male    DOB: 08-26-1960, 60 y.o.   MRN: [id number]  HPI   Patient is a 60 yr old male with hx of Cervical myelopathy, spasticity, and Urinary retention/prostate issues as well/neurogenic bladder  And bowel here and LE edema.  Here for f/u on cervical myelopathy.    Took off Gabapentin at last visit-  Has helped LE edema.   Now shaking more- tremors- esp at night.    Spasms- still jumping-  Still driving her at night. Hard to sleep with both.  Cannot lay down because so stiff.   Just picked up Baclofen yesterday.   Tizanidine- taking 6 mg nightly-   Miralax- took and took 3 dose sin 4 days- got CLEANED out.  Had real good BMs this AM- going every day since took Miralax.    Body feels tight and stiff-   Dr Yetta Barre wants a new MRI of his neck- going to [facility] for MRI since cannot get in regular MRI- they have a standing or sitting MRI- doing 4/29.   Still doing PT and OT- outpatient Working on walking more than anything. Using RW to ambulate.   Pain Inventory Average Pain 3 Pain Right Now 3 My pain is intermittent and aching  In the last 24 hours, has pain interfered with the following? General activity 10 Relation with others 10 Enjoyment of life 10 What TIME of day is your pain at its worst? night Sleep (in general) Poor  Pain is worse with: unsure Pain improves with: therapy/exercise Relief from Meds: 5  Family History  Problem Relation Age of Onset  . Colon cancer Mother    Social History   Socioeconomic History  . Marital status: Married    Spouse name: Not on file  . Number of children: Not on file  . Years of education: Not on file  . Highest education level: Not on file  Occupational History  . Not on file  Tobacco Use  . Smoking status: Never Smoker  . Smokeless tobacco: Never Used  Vaping Use  . Vaping Use: Never used  Substance and Sexual Activity  . Alcohol use: Not Currently     Alcohol/week: 6.0 standard drinks    Types: 6 Cans of beer per week  . Drug use: Never  . Sexual activity: Yes  Other Topics Concern  . Not on file  Social History Narrative  . Not on file   Social Determinants of Health   Financial Resource Strain: Not on file  Food Insecurity: Not on file  Transportation Needs: Not on file  Physical Activity: Not on file  Stress: Not on file  Social Connections: Not on file   Past Surgical History:  Procedure Laterality Date  . ANTERIOR CERVICAL CORPECTOMY N/A 08/09/2020   Procedure: Corpectomy Cervical five with anterior plating Cervical three-Cervical six;  Surgeon: Tia Alert, MD;  Location: [facility] OR;  Service: Neurosurgery;  Laterality: N/A;  . ANTERIOR CERVICAL DECOMP/DISCECTOMY FUSION N/A 08/09/2020   Procedure: Anterior Cervical Decompression Fusion  - Cervical three-Cervical four;  Surgeon: Tia Alert, MD;  Location: [facility] OR;  Service: Neurosurgery;  Laterality: N/A;  . BACK SURGERY     L5-S1  . CARDIAC CATHETERIZATION  2019  . COLONOSCOPY     multiple  . cyst removal     from head  . HAND SURGERY  1982  . HEMORRHOID SURGERY    . HERNIA REPAIR Bilateral   . PROSTATE  BIOPSY    . UPPER GI ENDOSCOPY     multiple  . VEIN SURGERY  2019   Past Surgical History:  Procedure Laterality Date  . ANTERIOR CERVICAL CORPECTOMY N/A 08/09/2020   Procedure: Corpectomy Cervical five with anterior plating Cervical three-Cervical six;  Surgeon: Tia Alert, MD;  Location: [facility] OR;  Service: Neurosurgery;  Laterality: N/A;  . ANTERIOR CERVICAL DECOMP/DISCECTOMY FUSION N/A 08/09/2020   Procedure: Anterior Cervical Decompression Fusion  - Cervical three-Cervical four;  Surgeon: Tia Alert, MD;  Location: [facility] OR;  Service: Neurosurgery;  Laterality: N/A;  . BACK SURGERY     L5-S1  . CARDIAC CATHETERIZATION  2019  . COLONOSCOPY     multiple  . cyst removal     from head  . HAND SURGERY  1982  . HEMORRHOID SURGERY    . HERNIA REPAIR Bilateral    . PROSTATE BIOPSY    . UPPER GI ENDOSCOPY     multiple  . VEIN SURGERY  2019   Past Medical History:  Diagnosis Date  . Ambulates with cane   . Anemia   . Arthritis    back, knee  . Asthma   . BPH (benign prostatic hyperplasia)   . ED (erectile dysfunction)   . Foot drop, left    some in right foot  . GERD (gastroesophageal reflux disease)   . Headache   . History of hiatal hernia    patient denied this dx  . HLD (hyperlipidemia)   . Hypertension   . Hypothyroidism   . Neuromuscular disorder (HCC)    numbness in right hand  . Peripheral vascular disease (HCC)   . Seasonal allergies   . Valvular heart disease    followed by [facility] cardiology   BP (!) 153/80   Pulse 78   Temp 98.3 F (36.8 C)   SpO2 96%   Opioid Risk Score:   Fall Risk Score:  `1  Depression screen PHQ 2/9  Depression screen PHQ 2/9 09/27/2020  Decreased Interest 0  Down, Depressed, Hopeless 0  PHQ - 2 Score 0  Altered sleeping 1  Tired, decreased energy 0  Change in appetite 0  Feeling bad or failure about yourself  0  Trouble concentrating 0  Moving slowly or fidgety/restless 0  Suicidal thoughts 0  PHQ-9 Score 1     Review of Systems  Musculoskeletal: Positive for back pain.       Back of left leg and arm pain  All other systems reviewed and are negative.      Objective:   Physical Exam  Awake, alert, appropriate , in manual w/c, accompanied by wife, NAD; appears stiff overall 2-3+ LE edema, but better than before off gabapentin MAS of 2 in UEs in wrists, elbows and shoulder B/L brisk Hoffman's B/L Few beats clonus in LEs and 2 beats at wrists B/L MAS of 1+ to 2 in hips, and knees- les in ankles.       Assessment & Plan:   Patient is a 60 yr old male with hx of Cervical myelopathy, spasticity, and Urinary retention/prostate issues as well/neurogenic bladder  And bowel here and LE edema.  Here for f/u on cervical myelopathy and severe/worsening spasticity.    1.  Will increase Baclofen to 20 mg QID/4x/day.   2. ADD/ Take Senna - 1-2 tabs daily to help with constipation- can take either Senna (senokot is brand) or Miralax.  Or 1/2 dose Miralax q2 days Will have constipation on this  dose of Baclofen.   3. Bump up Tizanidine to 8 mg (2 tabs) nightly for spasms-   4. Will wait for today on Dantrolene; but give me a call or Mychart in 2-3 weeks to let me know how things are going. And at that time, can start it- gradual onset medicine, so it will take ~ 2 weeks to notice improvement.   5. Need to speak with PCP about tremors of LUE- doesn't appear to be spasms to me- weights did help with therapy- might need referral to Neurology?  6. Off Gabapentin- make sure people know cannot take Gabapentin due to LE edema.   7. Pt would/qualifies for disability-  Pt is NOT able to work at this time- and not for the foreseeable future due to his incomplete quadriplegia and severe spasticity.    8. F/U in 6-8 weeks  Discussed spasticity agents, as well as other options- Valium didn't help much when took for MRI. Also discussed pt meets criteria for disability.

## 2020-12-13 ENCOUNTER — Ambulatory Visit: Payer: BC Managed Care – PPO | Admitting: Physical Medicine & Rehabilitation

## 2020-12-17 ENCOUNTER — Encounter: Payer: Self-pay | Admitting: Physical Medicine & Rehabilitation

## 2020-12-17 ENCOUNTER — Encounter
Payer: BC Managed Care – PPO | Attending: Physical Medicine and Rehabilitation | Admitting: Physical Medicine & Rehabilitation

## 2020-12-17 ENCOUNTER — Other Ambulatory Visit: Payer: Self-pay

## 2020-12-17 VITALS — BP 130/81 | HR 70 | Temp 99.1°F | Ht 71.0 in | Wt 196.0 lb

## 2020-12-17 DIAGNOSIS — G8250 Quadriplegia, unspecified: Secondary | ICD-10-CM | POA: Insufficient documentation

## 2020-12-17 NOTE — Progress Notes (Signed)
Subjective:    Patient ID: Keith Downs, male    DOB: July 06, 1961, 60 y.o.   MRN: [id number]  HPI  60 year old male with history of cervical myelopathy kindly referred by neurosurgery for the evaluation of spasticity primarily affecting the left upper and left lower limb.  Patient has a history of severe spinal stenosis and prior to his C3-4, 4-5 C5-6 anterior decompression and fusion on 08/09/2020, had a 2 mm cord.  Was admitted to inpatient rehabilitation spinal cord injury program, Dr. Berline Chough 08/14/2020 and was discharged 08/30/2020 he has chronic myelomalacia at C4-5 level with worsening posterior compression and after consultation with Dr. Yetta Barre, additional posterior decompression and fusion C3-C7 has been recommended. Has seen Dr. Luz Lex as an outpatient, severe kicking type spasms that woke him up at night are improved after increasing doses of tizanidine, baclofen as well as dantrolene. The patient has been going through outpatient therapy in [facility] where he lives. The patient notes some tightness in his shoulder as well as hand.  He also notices problems clearing his foot.  He has an AFO that he does not wear.  He states that it feels heavy Other past medical history significant for lumbar fusion L5-S1 by Dr. Windle Guard in [place] the patient has noted some foot and ankle weakness since that time Pain Inventory Average Pain 2 Pain Right Now 0 My pain is constant, tingling and Tightness, stiffness.  LOCATION OF PAIN  Mid abdomen, left arm, left leg, neck  BOWEL Number of stools per week: 3-4 Oral laxative use Yes  Type of laxative Miralax Enema or suppository use No  History of colostomy No  Incontinent No   BLADDER Normal In and out cath, frequency N/A Able to self cath No  Bladder incontinence No  Frequent urination No  Leakage with coughing No  Difficulty starting stream No  Incomplete bladder emptying Yes    Mobility use a walker how many  minutes can you walk? 30 MINS ability to climb steps?  yes do you drive?  no use a wheelchair transfers alone Do you have any goals in this area?  yes  Function employed # of hrs/week Medical leave 05/27/2020 from [facility] I need assistance with the following:  feeding, dressing, bathing, toileting, meal prep, household duties and shopping Do you have any goals in this area?  yes  Neuro/Psych weakness tingling trouble walking spasms depression  Prior Studies Any changes since last visit?  yes x-rays CT/MRI Dr. Marikay Alar  Physicians involved in your care Any changes since last visit?  no   Family History  Problem Relation Age of Onset  . Colon cancer Mother    Social History   Socioeconomic History  . Marital status: Married    Spouse name: Not on file  . Number of children: Not on file  . Years of education: Not on file  . Highest education level: Not on file  Occupational History  . Not on file  Tobacco Use  . Smoking status: Never Smoker  . Smokeless tobacco: Never Used  Vaping Use  . Vaping Use: Never used  Substance and Sexual Activity  . Alcohol use: Not Currently    Alcohol/week: 6.0 standard drinks    Types: 6 Cans of beer per week  . Drug use: Never  . Sexual activity: Yes  Other Topics Concern  . Not on file  Social History Narrative  . Not on file   Social Determinants of Health   Financial Resource  Strain: Not on file  Food Insecurity: Not on file  Transportation Needs: Not on file  Physical Activity: Not on file  Stress: Not on file  Social Connections: Not on file   Past Surgical History:  Procedure Laterality Date  . ANTERIOR CERVICAL CORPECTOMY N/A 08/09/2020   Procedure: Corpectomy Cervical five with anterior plating Cervical three-Cervical six;  Surgeon: Tia Alert, MD;  Location: [facility] OR;  Service: Neurosurgery;  Laterality: N/A;  . ANTERIOR CERVICAL DECOMP/DISCECTOMY FUSION N/A 08/09/2020   Procedure: Anterior Cervical  Decompression Fusion  - Cervical three-Cervical four;  Surgeon: Tia Alert, MD;  Location: [facility] OR;  Service: Neurosurgery;  Laterality: N/A;  . BACK SURGERY     L5-S1  . CARDIAC CATHETERIZATION  2019  . COLONOSCOPY     multiple  . cyst removal     from head  . HAND SURGERY  1982  . HEMORRHOID SURGERY    . HERNIA REPAIR Bilateral   . PROSTATE BIOPSY    . UPPER GI ENDOSCOPY     multiple  . VEIN SURGERY  2019   Past Medical History:  Diagnosis Date  . Ambulates with cane   . Anemia   . Arthritis    back, knee  . Asthma   . BPH (benign prostatic hyperplasia)   . ED (erectile dysfunction)   . Foot drop, left    some in right foot  . GERD (gastroesophageal reflux disease)   . Headache   . History of hiatal hernia    patient denied this dx  . HLD (hyperlipidemia)   . Hypertension   . Hypothyroidism   . Neuromuscular disorder (HCC)    numbness in right hand  . Peripheral vascular disease (HCC)   . Seasonal allergies   . Valvular heart disease    followed by [facility] cardiology   BP 130/81   Pulse 70   Temp 99.1 F (37.3 C)   Ht 5\' 11"  (1.803 m)   Wt 196 lb (88.9 kg)   SpO2 98%   BMI 27.34 kg/m   Opioid Risk Score:   Fall Risk Score:  `1  Depression screen PHQ 2/9  Depression screen PHQ 2/9 09/27/2020  Decreased Interest 0  Down, Depressed, Hopeless 0  PHQ - 2 Score 0  Altered sleeping 1  Tired, decreased energy 0  Change in appetite 0  Feeling bad or failure about yourself  0  Trouble concentrating 0  Moving slowly or fidgety/restless 0  Suicidal thoughts 0  PHQ-9 Score 1   Review of Systems  Gastrointestinal: Positive for abdominal pain.  Musculoskeletal: Positive for gait problem and neck pain.       Spasms in left leg & left arm  Psychiatric/Behavioral:       Depression  All other systems reviewed and are negative.      Objective:   Physical Exam Vitals and nursing note reviewed.  Constitutional:      Appearance: Normal appearance.   HENT:     Head: Normocephalic and atraumatic.  Eyes:     Extraocular Movements: Extraocular movements intact.     Conjunctiva/sclera: Conjunctivae normal.     Pupils: Pupils are equal, round, and reactive to light.  Neck:     Comments: Reduced cervical spine range of motion with flexion extension lateral bending and rotation. Musculoskeletal:        General: Deformity present. No swelling.     Right lower leg: Edema present.     Left lower leg: Edema present.  Neurological:     Mental Status: He is alert and oriented to person, place, and time.     Comments: Motor strength is 3 - left deltoid: Biceps trace triceps trace finger flexors and 0/5 extensors Left lower extremity 3+ in the hip flexors and knee extensors 3 at the hip adductor's and 2 at the ankle dorsiflexors Right upper extremity has 4-/5 strength right lower extremity 4/5 strength  Tone increased at the left pectoralis elbow flexor MAS 1 finger flexor MAS 2 wrist flexor MAS 1 Knee flexor MAS 2 knee extensor MAS 2 ankle plantar flexor MAS 3 With ambulation the patient exhibits a scissoring gait he initially is able to perform heel strike for the first 10 feet of ambulation but then this quickly fatigues and he has problems with toe drag.  Psychiatric:        Mood and Affect: Mood normal.        Behavior: Behavior normal.           Assessment & Plan:  1.  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  While his severe spasms are currently controlled with combination of tizanidine baclofen and dantrolene, the patient still has elevated hip adductor tone as well as ankle plantar flexor tone which causes his difficulty with foot clearance on the left side as well as causing a scissoring type gait. He has some limitation of left shoulder range of motion related to his pectoralis tightness Recommend the following regimen  Left pec 50U  Left hip add 100  Gastro soleus  150  Discussed at length with husband and wife that Botox can take about a week to 2 weeks to start working and has a duration of effect of 3 months Patient wishes to schedule.

## 2020-12-17 NOTE — Patient Instructions (Signed)
Plan to inject pectoralis and gastroc and adductor on left

## 2020-12-27 ENCOUNTER — Encounter: Payer: Self-pay | Admitting: Physical Medicine and Rehabilitation

## 2020-12-27 ENCOUNTER — Encounter
Payer: BC Managed Care – PPO | Attending: Physical Medicine and Rehabilitation | Admitting: Physical Medicine and Rehabilitation

## 2020-12-27 ENCOUNTER — Other Ambulatory Visit: Payer: Self-pay

## 2020-12-27 VITALS — BP 129/74 | HR 62 | Temp 98.6°F | Ht 71.0 in | Wt 193.6 lb

## 2020-12-27 DIAGNOSIS — G959 Disease of spinal cord, unspecified: Secondary | ICD-10-CM | POA: Insufficient documentation

## 2020-12-27 DIAGNOSIS — R252 Cramp and spasm: Secondary | ICD-10-CM | POA: Insufficient documentation

## 2020-12-27 DIAGNOSIS — M5412 Radiculopathy, cervical region: Secondary | ICD-10-CM | POA: Insufficient documentation

## 2020-12-27 DIAGNOSIS — G8250 Quadriplegia, unspecified: Secondary | ICD-10-CM | POA: Diagnosis not present

## 2020-12-27 NOTE — Patient Instructions (Signed)
Patient is a 60 yr old male with hx of Cervical myelopathy, spasticity, and Urinary retention/prostate issues as well/neurogenic bladderAnd bowelhere and LE edema. Here for f/u on SCI.    1. Will need Coude' if gets foley/catheter in the hospital.   2. Plan for Botox needs to be put on hold- saw Dr Wynn Banker- he wants pt to get L pec 50U, L hip adductor 100U; and gastrosoleus 150U- of Botox- But put on hold for new cervical surgery.   3. Went over will need Lovenox if in hospital >7 days- will start it.   4. Maintain baclofen and Tizanidine doses for now.  Wait on Dantrolene.   5. My chart me to let me know when your surgery is.   6. F/U in 6- 8 week- after new surgery. Cancel Botox appt.

## 2020-12-27 NOTE — Progress Notes (Signed)
Subjective:    Patient ID: Keith Downs, male    DOB: 1960-09-26, 60 y.o.   MRN: [id number]  HPI   Patient is a 60 yr old male with hx of Cervical myelopathy, spasticity, and Urinary retention/prostate issues as well/neurogenic bladderAnd bowelhere and LE edema. Here for f/u on SCI.   Went to [place], [place]- to get new MRI- and was told things were compressing Spinal cord.   Legs aren't jumping as much  Since increased spasticity meds at last visit- things going better. With increase Tizanidine to 8 mg QHS and increase in baclofen to 20 mg QID  Miralax- is working- no reason to change, since working- Had 4 BMs yesterday.  Not hard rocks- firm.    Called and left message- that medicine was working.   Surgeon- Dr Marikay Alar- wants to scheduled posterior cervical surgery- asap.   The "tremors" on LUE are also better with increase in baclofen.   No issues peeing/voiding- no cathing lately.     Pain Inventory Average Pain 3 Pain Right Now 3 My pain is tingling  LOCATION OF PAIN  Neck, back, leg BOWEL Number of stools per week: 3-4 Oral laxative use Yes  Type of laxative miralax Enema or suppository use No  History of colostomy No  Incontinent No   BLADDER Normal In and out cath, frequency na Able to self cath No  Bladder incontinence No  Frequent urination No  Leakage with coughing No  Difficulty starting stream No  Incomplete bladder emptying No    Mobility use a walker how many minutes can you walk? 3-5 ability to climb steps?  no do you drive?  no use a wheelchair transfers alone  Function disabled: date disabled 05-27-2000 I need assistance with the following:  dressing, bathing, meal prep, household duties and shopping  Neuro/Psych tingling trouble walking spasms depression  Prior Studies x-rays CT/MRI  Physicians involved in your care Neurosurgeon Dr. Marikay Alar   Family History  Problem Relation Age of Onset  . Colon  cancer Mother    Social History   Socioeconomic History  . Marital status: Married    Spouse name: Not on file  . Number of children: Not on file  . Years of education: Not on file  . Highest education level: Not on file  Occupational History  . Not on file  Tobacco Use  . Smoking status: Never Smoker  . Smokeless tobacco: Never Used  Vaping Use  . Vaping Use: Never used  Substance and Sexual Activity  . Alcohol use: Not Currently    Alcohol/week: 6.0 standard drinks    Types: 6 Cans of beer per week  . Drug use: Never  . Sexual activity: Yes  Other Topics Concern  . Not on file  Social History Narrative  . Not on file   Social Determinants of Health   Financial Resource Strain: Not on file  Food Insecurity: Not on file  Transportation Needs: Not on file  Physical Activity: Not on file  Stress: Not on file  Social Connections: Not on file   Past Surgical History:  Procedure Laterality Date  . ANTERIOR CERVICAL CORPECTOMY N/A 08/09/2020   Procedure: Corpectomy Cervical five with anterior plating Cervical three-Cervical six;  Surgeon: Tia Alert, MD;  Location: [facility] OR;  Service: Neurosurgery;  Laterality: N/A;  . ANTERIOR CERVICAL DECOMP/DISCECTOMY FUSION N/A 08/09/2020   Procedure: Anterior Cervical Decompression Fusion  - Cervical three-Cervical four;  Surgeon: Tia Alert, MD;  Location:  [facility] OR;  Service: Neurosurgery;  Laterality: N/A;  . BACK SURGERY     L5-S1  . CARDIAC CATHETERIZATION  2019  . COLONOSCOPY     multiple  . cyst removal     from head  . HAND SURGERY  1982  . HEMORRHOID SURGERY    . HERNIA REPAIR Bilateral   . PROSTATE BIOPSY    . UPPER GI ENDOSCOPY     multiple  . VEIN SURGERY  2019   Past Medical History:  Diagnosis Date  . Ambulates with cane   . Anemia   . Arthritis    back, knee  . Asthma   . BPH (benign prostatic hyperplasia)   . ED (erectile dysfunction)   . Foot drop, left    some in right foot  . GERD (gastroesophageal  reflux disease)   . Headache   . History of hiatal hernia    patient denied this dx  . HLD (hyperlipidemia)   . Hypertension   . Hypothyroidism   . Neuromuscular disorder (HCC)    numbness in right hand  . Peripheral vascular disease (HCC)   . Seasonal allergies   . Valvular heart disease    followed by [facility] cardiology   BP 129/74   Pulse 62   Temp 98.6 F (37 C)   Ht 5\' 11"  (1.803 m)   Wt 193 lb 9.6 oz (87.8 kg)   SpO2 96%   BMI 27.00 kg/m   Opioid Risk Score:   Fall Risk Score:  `1  Depression screen PHQ 2/9  Depression screen [facility] 2/9 12/17/2020 09/27/2020  Decreased Interest 1 0  Down, Depressed, Hopeless 1 0  PHQ - 2 Score 2 0  Altered sleeping - 1  Tired, decreased energy - 0  Change in appetite - 0  Feeling bad or failure about yourself  - 0  Trouble concentrating - 0  Moving slowly or fidgety/restless - 0  Suicidal thoughts - 0  PHQ-9 Score - 1    Review of Systems  Musculoskeletal: Positive for back pain and neck pain.       Leg pain  All other systems reviewed and are negative.      Objective:   Physical Exam Awake, alert, appropriate, using RW to walk; accompanied by wife, NAD Dynamic spasticity in LLE-scissoring gait esp on L- (needs R knee surgery for TKR).  Also limping on R.  (+) B/L hoffman's in UEs MAS of 2 in L wrist and 1+ in R wrist MAS of 3 in L knee and L hip >10 beats clonus in LLE.  2-3 beats on RLE.      Assessment & Plan:    Patient is a 60 yr old male with hx of Cervical myelopathy, spasticity, and Urinary retention/prostate issues as well/neurogenic bladderAnd bowelhere and LE edema. Here for f/u on SCI.    1. Will need Coude' if gets foley/catheter in the hospital.   2. Plan for Botox needs to be put on hold- saw Dr 67- he wants pt to get L pec 50U, L hip adductor 100U; and gastrosoleus 150U- of Botox- But put on hold for new cervical surgery.   3. Went over will need Lovenox if in hospital >7 days-  will start it.   4. Maintain baclofen and Tizanidine doses for now.  Wait on Dantrolene.   5. My chart me to let me know when your surgery is.   6. F/U in 2 months- after new surgery.  I spent a  total of 20 minutes on visit- going over surgery- and plans for Botox and to continue Spasticity meds.

## 2021-01-06 ENCOUNTER — Other Ambulatory Visit: Payer: Self-pay | Admitting: Neurological Surgery

## 2021-01-06 DIAGNOSIS — M4802 Spinal stenosis, cervical region: Secondary | ICD-10-CM

## 2021-01-13 ENCOUNTER — Telehealth: Payer: Self-pay | Admitting: *Deleted

## 2021-01-13 NOTE — Telephone Encounter (Signed)
Mr Schoon called x 2 for increased spasticity and is asking to increase his meds. He is askign for a call back from you. # [phone].

## 2021-01-13 NOTE — Telephone Encounter (Signed)
I increased his baclofen to 30 mg 4x/day until surgery- ML

## 2021-01-16 ENCOUNTER — Encounter (HOSPITAL_COMMUNITY): Payer: Self-pay | Admitting: Neurological Surgery

## 2021-01-16 ENCOUNTER — Other Ambulatory Visit: Payer: Self-pay

## 2021-01-16 NOTE — Progress Notes (Signed)
Keith Downs denies chest pain or shortness of breath. Patient denies any s/s of Covid in his home and is unaware of any exposures. Keith Downs will be tested for Covid on arrival.

## 2021-01-17 ENCOUNTER — Encounter (HOSPITAL_COMMUNITY): Payer: Self-pay | Admitting: Neurological Surgery

## 2021-01-17 ENCOUNTER — Inpatient Hospital Stay (HOSPITAL_COMMUNITY): Payer: BC Managed Care – PPO

## 2021-01-17 ENCOUNTER — Encounter (HOSPITAL_COMMUNITY)
Admission: RE | Disposition: A | Discharge status: Home / Self Care | Payer: Self-pay | Source: Home / Self Care | Attending: Neurological Surgery

## 2021-01-17 ENCOUNTER — Inpatient Hospital Stay (HOSPITAL_COMMUNITY): Payer: BC Managed Care – PPO | Admitting: Certified Registered Nurse Anesthetist

## 2021-01-17 ENCOUNTER — Other Ambulatory Visit: Payer: Self-pay

## 2021-01-17 ENCOUNTER — Inpatient Hospital Stay (HOSPITAL_COMMUNITY)
Admission: RE | Admit: 2021-01-17 | Discharge: 2021-01-18 | DRG: 472 | Disposition: A | Discharge status: Home / Self Care | Payer: BC Managed Care – PPO | Attending: Neurosurgery | Admitting: Neurosurgery

## 2021-01-17 DIAGNOSIS — M542 Cervicalgia: Secondary | ICD-10-CM | POA: Diagnosis present

## 2021-01-17 DIAGNOSIS — K219 Gastro-esophageal reflux disease without esophagitis: Secondary | ICD-10-CM | POA: Diagnosis present

## 2021-01-17 DIAGNOSIS — Z7951 Long term (current) use of inhaled steroids: Secondary | ICD-10-CM | POA: Diagnosis not present

## 2021-01-17 DIAGNOSIS — Z20822 Contact with and (suspected) exposure to covid-19: Secondary | ICD-10-CM | POA: Diagnosis present

## 2021-01-17 DIAGNOSIS — J45909 Unspecified asthma, uncomplicated: Secondary | ICD-10-CM | POA: Diagnosis present

## 2021-01-17 DIAGNOSIS — E785 Hyperlipidemia, unspecified: Secondary | ICD-10-CM | POA: Diagnosis present

## 2021-01-17 DIAGNOSIS — M4802 Spinal stenosis, cervical region: Secondary | ICD-10-CM | POA: Diagnosis present

## 2021-01-17 DIAGNOSIS — M4712 Other spondylosis with myelopathy, cervical region: Secondary | ICD-10-CM | POA: Diagnosis present

## 2021-01-17 DIAGNOSIS — Z79899 Other long term (current) drug therapy: Secondary | ICD-10-CM | POA: Diagnosis not present

## 2021-01-17 DIAGNOSIS — N400 Enlarged prostate without lower urinary tract symptoms: Secondary | ICD-10-CM | POA: Diagnosis present

## 2021-01-17 DIAGNOSIS — Z888 Allergy status to other drugs, medicaments and biological substances status: Secondary | ICD-10-CM

## 2021-01-17 DIAGNOSIS — G709 Myoneural disorder, unspecified: Secondary | ICD-10-CM | POA: Diagnosis present

## 2021-01-17 DIAGNOSIS — I10 Essential (primary) hypertension: Secondary | ICD-10-CM | POA: Diagnosis present

## 2021-01-17 DIAGNOSIS — Z79890 Hormone replacement therapy (postmenopausal): Secondary | ICD-10-CM | POA: Diagnosis not present

## 2021-01-17 DIAGNOSIS — E039 Hypothyroidism, unspecified: Secondary | ICD-10-CM | POA: Diagnosis present

## 2021-01-17 DIAGNOSIS — Z419 Encounter for procedure for purposes other than remedying health state, unspecified: Secondary | ICD-10-CM

## 2021-01-17 DIAGNOSIS — M5001 Cervical disc disorder with myelopathy,  high cervical region: Secondary | ICD-10-CM | POA: Diagnosis present

## 2021-01-17 DIAGNOSIS — I739 Peripheral vascular disease, unspecified: Secondary | ICD-10-CM | POA: Diagnosis present

## 2021-01-17 HISTORY — PX: POSTERIOR CERVICAL FUSION/FORAMINOTOMY: SHX5038

## 2021-01-17 HISTORY — DX: Pneumonia, unspecified organism: J18.9

## 2021-01-17 LAB — CBC WITH DIFFERENTIAL/PLATELET
Abs Immature Granulocytes: 0.01 10*3/uL (ref 0.00–0.07)
Basophils Absolute: 0 10*3/uL (ref 0.0–0.1)
Basophils Relative: 0 %
Eosinophils Absolute: 0.1 10*3/uL (ref 0.0–0.5)
Eosinophils Relative: 2 %
HCT: 42.9 % (ref 39.0–52.0)
Hemoglobin: 14.8 g/dL (ref 13.0–17.0)
Immature Granulocytes: 0 %
Lymphocytes Relative: 19 %
Lymphs Abs: 1.4 10*3/uL (ref 0.7–4.0)
MCH: 29 pg (ref 26.0–34.0)
MCHC: 34.5 g/dL (ref 30.0–36.0)
MCV: 84 fL (ref 80.0–100.0)
Monocytes Absolute: 0.4 10*3/uL (ref 0.1–1.0)
Monocytes Relative: 5 %
Neutro Abs: 5.4 10*3/uL (ref 1.7–7.7)
Neutrophils Relative %: 74 %
Platelets: 164 10*3/uL (ref 150–400)
RBC: 5.11 MIL/uL (ref 4.22–5.81)
RDW: 12.4 % (ref 11.5–15.5)
WBC: 7.3 10*3/uL (ref 4.0–10.5)
nRBC: 0 % (ref 0.0–0.2)

## 2021-01-17 LAB — PROTIME-INR
INR: 1 (ref 0.8–1.2)
Prothrombin Time: 12.7 seconds (ref 11.4–15.2)

## 2021-01-17 LAB — BASIC METABOLIC PANEL
Anion gap: 6 (ref 5–15)
BUN: 15 mg/dL (ref 8–23)
CO2: 26 mmol/L (ref 22–32)
Calcium: 9.2 mg/dL (ref 8.9–10.3)
Chloride: 107 mmol/L (ref 98–111)
Creatinine, Ser: 0.92 mg/dL (ref 0.61–1.24)
GFR, Estimated: >60 mL/min (ref >60)
Glucose, Bld: 94 mg/dL (ref 70–99)
Potassium: 3.9 mmol/L (ref 3.5–5.1)
Sodium: 139 mmol/L (ref 135–145)

## 2021-01-17 LAB — SARS CORONAVIRUS 2 BY RT PCR (HOSPITAL ORDER, PERFORMED IN ~~LOC~~ HOSPITAL LAB): SARS Coronavirus 2: NEGATIVE

## 2021-01-17 LAB — SURGICAL PCR SCREEN
MRSA, PCR: NEGATIVE
Staphylococcus aureus: NEGATIVE

## 2021-01-17 LAB — TYPE AND SCREEN
ABO/RH(D): O POS
Antibody Screen: NEGATIVE

## 2021-01-17 SURGERY — POSTERIOR CERVICAL FUSION/FORAMINOTOMY LEVEL 4
Anesthesia: General | Site: Spine Cervical

## 2021-01-17 MED ORDER — SODIUM CHLORIDE 0.9% FLUSH
3.0000 mL | Freq: Two times a day (BID) | INTRAVENOUS | Status: DC
  Administered 2021-01-17: 3 mL via INTRAVENOUS

## 2021-01-17 MED ORDER — THROMBIN 20000 UNITS EX SOLR
CUTANEOUS | Status: AC
  Filled 2021-01-17: qty 20000

## 2021-01-17 MED ORDER — BUPIVACAINE HCL (PF) 0.25 % IJ SOLN
INTRAMUSCULAR | Status: AC | PRN
  Administered 2021-01-17: 7 mL/h

## 2021-01-17 MED ORDER — DEXAMETHASONE SODIUM PHOSPHATE 4 MG/ML IJ SOLN
4.0000 mg | Freq: Four times a day (QID) | INTRAMUSCULAR | Status: DC
  Administered 2021-01-18 (×2): 4 mg via INTRAVENOUS
  Filled 2021-01-17 (×2): qty 1

## 2021-01-17 MED ORDER — ACETAMINOPHEN 650 MG RE SUPP: 650.0000 mg | RECTAL | Status: DC | PRN

## 2021-01-17 MED ORDER — CEFAZOLIN SODIUM-DEXTROSE 2-4 GM/100ML-% IV SOLN
2.0000 g | INTRAVENOUS | Status: AC
  Administered 2021-01-17: 2 g via INTRAVENOUS
  Filled 2021-01-17: qty 100

## 2021-01-17 MED ORDER — THROMBIN 20000 UNITS EX SOLR: CUTANEOUS | Status: DC | PRN

## 2021-01-17 MED ORDER — CHLORHEXIDINE GLUCONATE 0.12 % MT SOLN
15.0000 mL | Freq: Once | OROMUCOSAL | Status: AC
Start: 2021-01-17 — End: 2021-01-17
  Administered 2021-01-17: 15 mL via OROMUCOSAL
  Filled 2021-01-17: qty 15

## 2021-01-17 MED ORDER — THROMBIN 5000 UNITS EX SOLR
CUTANEOUS | Status: AC
  Filled 2021-01-17: qty 5000

## 2021-01-17 MED ORDER — OXYCODONE HCL 5 MG/5ML PO SOLN: 5.0000 mg | Freq: Once | ORAL | Status: DC | PRN

## 2021-01-17 MED ORDER — FENTANYL CITRATE (PF) 100 MCG/2ML IJ SOLN
INTRAMUSCULAR | Status: AC
  Filled 2021-01-17: qty 2

## 2021-01-17 MED ORDER — PHENYLEPHRINE 40 MCG/ML (10ML) SYRINGE FOR IV PUSH (FOR BLOOD PRESSURE SUPPORT)
PREFILLED_SYRINGE | INTRAVENOUS | Status: DC | PRN
  Administered 2021-01-17 (×2): 80 ug via INTRAVENOUS

## 2021-01-17 MED ORDER — EPHEDRINE 5 MG/ML INJ
INTRAVENOUS | Status: AC
  Filled 2021-01-17: qty 10

## 2021-01-17 MED ORDER — MIDAZOLAM HCL 2 MG/2ML IJ SOLN
INTRAMUSCULAR | Status: DC | PRN
  Administered 2021-01-17: 2 mg via INTRAVENOUS

## 2021-01-17 MED ORDER — BACLOFEN 10 MG PO TABS
20.0000 mg | ORAL_TABLET | Freq: Four times a day (QID) | ORAL | Status: DC
  Administered 2021-01-17 – 2021-01-18 (×2): 20 mg via ORAL
  Filled 2021-01-17 (×3): qty 2

## 2021-01-17 MED ORDER — DEXAMETHASONE SODIUM PHOSPHATE 10 MG/ML IJ SOLN
10.0000 mg | Freq: Once | INTRAMUSCULAR | Status: AC
  Administered 2021-01-17: 10 mg via INTRAVENOUS
  Filled 2021-01-17: qty 1

## 2021-01-17 MED ORDER — MIDAZOLAM HCL 2 MG/2ML IJ SOLN
INTRAMUSCULAR | Status: AC
  Filled 2021-01-17: qty 2

## 2021-01-17 MED ORDER — SUGAMMADEX SODIUM 200 MG/2ML IV SOLN
INTRAVENOUS | Status: DC | PRN
  Administered 2021-01-17: 200 mg via INTRAVENOUS

## 2021-01-17 MED ORDER — ACETAMINOPHEN 500 MG PO TABS
1000.0000 mg | ORAL_TABLET | ORAL | Status: AC
  Administered 2021-01-17: 1000 mg via ORAL
  Filled 2021-01-17: qty 2

## 2021-01-17 MED ORDER — MOMETASONE FURO-FORMOTEROL FUM 100-5 MCG/ACT IN AERO
2.0000 | INHALATION_SPRAY | Freq: Two times a day (BID) | RESPIRATORY_TRACT | Status: DC
  Administered 2021-01-17 – 2021-01-18 (×2): 2 via RESPIRATORY_TRACT
  Filled 2021-01-17: qty 8.8

## 2021-01-17 MED ORDER — TAMSULOSIN HCL 0.4 MG PO CAPS
0.4000 mg | ORAL_CAPSULE | Freq: Every day | ORAL | Status: DC
  Administered 2021-01-17: 0.4 mg via ORAL
  Filled 2021-01-17: qty 1

## 2021-01-17 MED ORDER — CHLORHEXIDINE GLUCONATE CLOTH 2 % EX PADS: 6.0000 | MEDICATED_PAD | Freq: Once | CUTANEOUS | Status: DC

## 2021-01-17 MED ORDER — PROMETHAZINE HCL 25 MG/ML IJ SOLN: 6.2500 mg | INTRAMUSCULAR | Status: DC | PRN

## 2021-01-17 MED ORDER — BUPIVACAINE HCL (PF) 0.25 % IJ SOLN
INTRAMUSCULAR | Status: AC
  Filled 2021-01-17: qty 30

## 2021-01-17 MED ORDER — LACTATED RINGERS IV SOLN: INTRAVENOUS | Status: DC | PRN

## 2021-01-17 MED ORDER — SODIUM CHLORIDE 0.9% FLUSH: 3.0000 mL | INTRAVENOUS | Status: DC | PRN

## 2021-01-17 MED ORDER — ONDANSETRON HCL 4 MG/2ML IJ SOLN: 4.0000 mg | Freq: Four times a day (QID) | INTRAMUSCULAR | Status: DC | PRN

## 2021-01-17 MED ORDER — MORPHINE SULFATE (PF) 2 MG/ML IV SOLN
2.0000 mg | INTRAVENOUS | Status: DC | PRN
Start: 2021-01-17 — End: 2021-01-18
  Administered 2021-01-17: 2 mg via INTRAVENOUS
  Filled 2021-01-17: qty 1

## 2021-01-17 MED ORDER — FENTANYL CITRATE (PF) 250 MCG/5ML IJ SOLN
INTRAMUSCULAR | Status: DC | PRN
  Administered 2021-01-17: 100 ug via INTRAVENOUS
  Administered 2021-01-17: 50 ug via INTRAVENOUS

## 2021-01-17 MED ORDER — PHENYLEPHRINE 40 MCG/ML (10ML) SYRINGE FOR IV PUSH (FOR BLOOD PRESSURE SUPPORT)
PREFILLED_SYRINGE | INTRAVENOUS | Status: AC
  Filled 2021-01-17: qty 10

## 2021-01-17 MED ORDER — PHENYLEPHRINE HCL-NACL 10-0.9 MG/250ML-% IV SOLN
INTRAVENOUS | Status: DC | PRN
  Administered 2021-01-17: 20 ug/min via INTRAVENOUS

## 2021-01-17 MED ORDER — LIDOCAINE 2% (20 MG/ML) 5 ML SYRINGE
INTRAMUSCULAR | Status: DC | PRN
  Administered 2021-01-17: 80 mg via INTRAVENOUS

## 2021-01-17 MED ORDER — ONDANSETRON HCL 4 MG/2ML IJ SOLN
INTRAMUSCULAR | Status: DC | PRN
  Administered 2021-01-17: 4 mg via INTRAVENOUS

## 2021-01-17 MED ORDER — THROMBIN 5000 UNITS EX SOLR: OROMUCOSAL | Status: DC | PRN

## 2021-01-17 MED ORDER — PROPOFOL 10 MG/ML IV BOLUS
INTRAVENOUS | Status: DC | PRN
  Administered 2021-01-17: 50 mg via INTRAVENOUS
  Administered 2021-01-17: 150 mg via INTRAVENOUS

## 2021-01-17 MED ORDER — FENTANYL CITRATE (PF) 250 MCG/5ML IJ SOLN
INTRAMUSCULAR | Status: AC
  Filled 2021-01-17: qty 5

## 2021-01-17 MED ORDER — EPHEDRINE SULFATE-NACL 50-0.9 MG/10ML-% IV SOSY
PREFILLED_SYRINGE | INTRAVENOUS | Status: DC | PRN
  Administered 2021-01-17: 10 mg via INTRAVENOUS

## 2021-01-17 MED ORDER — PHENOL 1.4 % MT LIQD: 1.0000 | OROMUCOSAL | Status: DC | PRN

## 2021-01-17 MED ORDER — ORAL CARE MOUTH RINSE: 15.0000 mL | Freq: Once | OROMUCOSAL | Status: AC

## 2021-01-17 MED ORDER — MENTHOL 3 MG MT LOZG: 1.0000 | LOZENGE | OROMUCOSAL | Status: DC | PRN

## 2021-01-17 MED ORDER — PROPOFOL 10 MG/ML IV BOLUS
INTRAVENOUS | Status: AC
  Filled 2021-01-17: qty 20

## 2021-01-17 MED ORDER — GABAPENTIN 300 MG PO CAPS
300.0000 mg | ORAL_CAPSULE | ORAL | Status: AC
  Administered 2021-01-17: 300 mg via ORAL
  Filled 2021-01-17: qty 1

## 2021-01-17 MED ORDER — SENNA 8.6 MG PO TABS
1.0000 | ORAL_TABLET | Freq: Two times a day (BID) | ORAL | Status: DC
  Administered 2021-01-17 – 2021-01-18 (×2): 8.6 mg via ORAL
  Filled 2021-01-17 (×2): qty 1

## 2021-01-17 MED ORDER — POTASSIUM CHLORIDE IN NACL 20-0.9 MEQ/L-% IV SOLN
INTRAVENOUS | Status: DC
  Filled 2021-01-17: qty 1000

## 2021-01-17 MED ORDER — ROCURONIUM BROMIDE 10 MG/ML (PF) SYRINGE
PREFILLED_SYRINGE | INTRAVENOUS | Status: DC | PRN
  Administered 2021-01-17: 20 mg via INTRAVENOUS
  Administered 2021-01-17: 80 mg via INTRAVENOUS

## 2021-01-17 MED ORDER — CEFAZOLIN SODIUM-DEXTROSE 2-4 GM/100ML-% IV SOLN
2.0000 g | Freq: Three times a day (TID) | INTRAVENOUS | Status: DC
  Administered 2021-01-17: 2 g via INTRAVENOUS
  Filled 2021-01-17 (×2): qty 100

## 2021-01-17 MED ORDER — LEVOTHYROXINE SODIUM 25 MCG PO TABS
125.0000 ug | ORAL_TABLET | Freq: Every day | ORAL | Status: DC
  Administered 2021-01-18: 125 ug via ORAL
  Filled 2021-01-17: qty 1

## 2021-01-17 MED ORDER — LACTATED RINGERS IV SOLN: INTRAVENOUS | Status: DC

## 2021-01-17 MED ORDER — ONDANSETRON HCL 4 MG PO TABS: 4.0000 mg | ORAL_TABLET | Freq: Four times a day (QID) | ORAL | Status: DC | PRN

## 2021-01-17 MED ORDER — OXYCODONE HCL 5 MG PO TABS
5.0000 mg | ORAL_TABLET | ORAL | Status: DC | PRN
  Administered 2021-01-18 (×2): 5 mg via ORAL
  Filled 2021-01-17 (×3): qty 1

## 2021-01-17 MED ORDER — ACETAMINOPHEN 325 MG PO TABS: 650.0000 mg | ORAL_TABLET | ORAL | Status: DC | PRN

## 2021-01-17 MED ORDER — 0.9 % SODIUM CHLORIDE (POUR BTL) OPTIME
TOPICAL | Status: DC | PRN
  Administered 2021-01-17: 1000 mL

## 2021-01-17 MED ORDER — FENTANYL CITRATE (PF) 100 MCG/2ML IJ SOLN
25.0000 ug | INTRAMUSCULAR | Status: DC | PRN
  Administered 2021-01-17: 50 ug via INTRAVENOUS

## 2021-01-17 MED ORDER — OXYCODONE HCL 5 MG PO TABS: 5.0000 mg | ORAL_TABLET | Freq: Once | ORAL | Status: DC | PRN

## 2021-01-17 MED ORDER — SODIUM CHLORIDE 0.9 % IV SOLN: 250.0000 mL | INTRAVENOUS | Status: DC

## 2021-01-17 MED ORDER — DEXAMETHASONE 4 MG PO TABS
4.0000 mg | ORAL_TABLET | Freq: Four times a day (QID) | ORAL | Status: DC
  Administered 2021-01-17 – 2021-01-18 (×2): 4 mg via ORAL
  Filled 2021-01-17 (×2): qty 1

## 2021-01-17 MED ORDER — TIZANIDINE HCL 4 MG PO TABS
8.0000 mg | ORAL_TABLET | Freq: Every day | ORAL | Status: DC
  Administered 2021-01-17: 8 mg via ORAL
  Filled 2021-01-17: qty 2

## 2021-01-17 SURGICAL SUPPLY — 56 items
BASKET BONE COLLECTION (BASKET) ×3 IMPLANT
BENZOIN TINCTURE PRP APPL 2/3 (GAUZE/BANDAGES/DRESSINGS) ×3 IMPLANT
BLADE CLIPPER SURG (BLADE) IMPLANT
BUR CARBIDE MATCH 3.0 (BURR) ×3 IMPLANT
CANISTER SUCT 3000ML PPV (MISCELLANEOUS) ×3 IMPLANT
CLOSURE STERI-STRIP 1/2X4 (GAUZE/BANDAGES/DRESSINGS) ×1
CLOSURE WOUND 1/2 X4 (GAUZE/BANDAGES/DRESSINGS) ×1
CLSR STERI-STRIP ANTIMIC 1/2X4 (GAUZE/BANDAGES/DRESSINGS) ×2 IMPLANT
CNTNR URN SCR LID CUP LEK RST (MISCELLANEOUS) ×1 IMPLANT
CONT SPEC 4OZ STRL OR WHT (MISCELLANEOUS) ×2
COVER WAND RF STERILE (DRAPES) IMPLANT
DERMABOND ADVANCED (GAUZE/BANDAGES/DRESSINGS) ×2
DERMABOND ADVANCED .7 DNX12 (GAUZE/BANDAGES/DRESSINGS) ×1 IMPLANT
DRAPE C-ARM 42X72 X-RAY (DRAPES) ×6 IMPLANT
DRAPE LAPAROTOMY 100X72 PEDS (DRAPES) ×3 IMPLANT
DRSG OPSITE POSTOP 4X8 (GAUZE/BANDAGES/DRESSINGS) ×3 IMPLANT
DURAPREP 6ML APPLICATOR 50/CS (WOUND CARE) ×3 IMPLANT
ELECT REM PT RETURN 9FT ADLT (ELECTROSURGICAL) ×3
ELECTRODE REM PT RTRN 9FT ADLT (ELECTROSURGICAL) ×1 IMPLANT
EVACUATOR 1/8 PVC DRAIN (DRAIN) ×3 IMPLANT
GAUZE 4X4 16PLY RFD (DISPOSABLE) IMPLANT
GAUZE SPONGE 4X4 12PLY STRL (GAUZE/BANDAGES/DRESSINGS) ×3 IMPLANT
GLOVE EXAM NITRILE XL STR (GLOVE) IMPLANT
GLOVE SURG ENC MOIS LTX SZ7 (GLOVE) IMPLANT
GLOVE SURG ENC MOIS LTX SZ8 (GLOVE) ×3 IMPLANT
GLOVE SURG UNDER POLY LF SZ7 (GLOVE) IMPLANT
GOWN STRL REUS W/ TWL LRG LVL3 (GOWN DISPOSABLE) ×1 IMPLANT
GOWN STRL REUS W/ TWL XL LVL3 (GOWN DISPOSABLE) ×1 IMPLANT
GOWN STRL REUS W/TWL 2XL LVL3 (GOWN DISPOSABLE) ×3 IMPLANT
GOWN STRL REUS W/TWL LRG LVL3 (GOWN DISPOSABLE) ×2
GOWN STRL REUS W/TWL XL LVL3 (GOWN DISPOSABLE) ×2
HEMOSTAT POWDER KIT SURGIFOAM (HEMOSTASIS) ×3 IMPLANT
KIT BASIN OR (CUSTOM PROCEDURE TRAY) ×3 IMPLANT
KIT TURNOVER KIT B (KITS) ×3 IMPLANT
MARKER SKIN DUAL TIP RULER LAB (MISCELLANEOUS) ×3 IMPLANT
NEEDLE HYPO 18GX1.5 BLUNT FILL (NEEDLE) IMPLANT
NEEDLE HYPO 25X1 1.5 SAFETY (NEEDLE) ×3 IMPLANT
NEEDLE SPNL 20GX3.5 QUINCKE YW (NEEDLE) ×3 IMPLANT
NS IRRIG 1000ML POUR BTL (IV SOLUTION) ×3 IMPLANT
PACK LAMINECTOMY NEURO (CUSTOM PROCEDURE TRAY) ×3 IMPLANT
PIN MAYFIELD SKULL DISP (PIN) ×3 IMPLANT
PUTTY BONE 2.5CC (Putty) ×6 IMPLANT
ROD LORD TI 3.5X75 (Rod) ×6 IMPLANT
SCREW POLYAXIAL 3.5 X 14 (Screw) ×30 IMPLANT
SCREW SET ATEC (Screw) ×30 IMPLANT
SPONGE SURGIFOAM ABS GEL 100 (HEMOSTASIS) ×3 IMPLANT
STRIP CLOSURE SKIN 1/2X4 (GAUZE/BANDAGES/DRESSINGS) ×2 IMPLANT
SUT VIC AB 0 CT1 18XCR BRD8 (SUTURE) ×2 IMPLANT
SUT VIC AB 0 CT1 8-18 (SUTURE) ×4
SUT VIC AB 2-0 CP2 18 (SUTURE) ×3 IMPLANT
SUT VIC AB 3-0 SH 8-18 (SUTURE) ×6 IMPLANT
TOWEL GREEN STERILE (TOWEL DISPOSABLE) ×3 IMPLANT
TOWEL GREEN STERILE FF (TOWEL DISPOSABLE) ×3 IMPLANT
TRAY FOLEY MTR SLVR 16FR STAT (SET/KITS/TRAYS/PACK) IMPLANT
UNDERPAD 30X36 HEAVY ABSORB (UNDERPADS AND DIAPERS) ×3 IMPLANT
WATER STERILE IRR 1000ML POUR (IV SOLUTION) ×3 IMPLANT

## 2021-01-17 NOTE — Transfer of Care (Signed)
Immediate Anesthesia Transfer of Care Note  Patient: Keith Downs  Procedure(s) Performed: Posterior cervical fusion with lateral mass fixation, cervical laminectomy Cervical three-seven (Spine Cervical)  Patient Location: PACU  Anesthesia Type:General  Level of Consciousness: awake and drowsy  Airway & Oxygen Therapy: Patient Spontanous Breathing and Patient connected to nasal cannula oxygen  Post-op Assessment: Report given to RN and Post -op Vital signs reviewed and stable  Post vital signs: Reviewed and stable  Last Vitals:  Vitals Value Taken Time  BP 173/104 01/17/21 1708  Temp    Pulse 117 01/17/21 1710  Resp 22 01/17/21 1710  SpO2 96 % 01/17/21 1710  Vitals shown include unvalidated device data.  Last Pain:  Vitals:   01/17/21 1116  TempSrc:   PainSc: 3       Patients Stated Pain Goal: 3 (01/17/21 1116)  Complications: No notable events documented.

## 2021-01-17 NOTE — H&P (Signed)
Subjective:   Patient is a 60 y.o. Downs admitted for cervical spinal stenosis with myelopathy.  He has undergone a previous anterior cervical corpectomy and ACDF but has residual spinal stenosis we felt a posterior decompression and instrumented fusion was indicated. the patient first presented to me with complaints of occultly walking. Onset of symptoms was 2 years ago. The pain is described as aching and occurs intermittently. The pain is rated moderate, and is located in the neck and radiates to the shoulders. The symptoms have not been progressive. Symptoms are exacerbated by nothing in particular, and are relieved by nothing in particular.  Previous work up includes MRI of cervical spine, results: spinal stenosis.  Past Medical History:  Diagnosis Date   Ambulates with cane    Anemia    Arthritis    back, knee   Asthma    BPH (benign prostatic hyperplasia)    ED (erectile dysfunction)    Foot drop, left    some in right foot   GERD (gastroesophageal reflux disease)    Headache    occular migraine   History of hiatal hernia    patient denied this dx   HLD (hyperlipidemia)    Hypertension    Hypothyroidism    Neuromuscular disorder (HCC)    numbness in right hand   Peripheral vascular disease (HCC)    Pneumonia    Seasonal allergies    Valvular heart disease    followed by [facility] cardiology    Past Surgical History:  Procedure Laterality Date   ANTERIOR CERVICAL CORPECTOMY N/A 08/09/2020   Procedure: Corpectomy Cervical five with anterior plating Cervical three-Cervical six;  Surgeon: Tia Alert, MD;  [facility] OR;  Service: Neurosurgery;  Laterality: N/A;   ANTERIOR CERVICAL DECOMP/DISCECTOMY FUSION N/A 08/09/2020   Procedure: Anterior Cervical Decompression Fusion  - Cervical three-Cervical four;  Surgeon: Tia Alert, MD;  Location: [facility] OR;  Service: Neurosurgery;  Laterality: N/A;   BACK SURGERY     L5-S1   CARDIAC CATHETERIZATION  2019   COLONOSCOPY      multiple   cyst removal     from head   HAND SURGERY  1982   HEMORRHOID SURGERY     HERNIA REPAIR Bilateral    PROSTATE BIOPSY     UPPER GI ENDOSCOPY     multiple   VEIN SURGERY  2019    Allergies  Allergen Reactions   Niacin And Related     Face flushed   Niacin Rash     Flushing     Social History   Tobacco Use   Smoking status: Never   Smokeless tobacco: Never  Substance Use Topics   Alcohol use: Not Currently    Alcohol/week: 6.0 standard drinks    Types: 6 Cans of beer per week    Family History  Problem Relation Age of Onset   Colon cancer Mother    Prior to Admission medications   Medication Sig Start Date End Date Taking? Authorizing Provider  baclofen (LIORESAL) 20 MG tablet Take 1 tablet (20 mg total) by mouth 4 (four) times daily. 11/04/20  Yes Lovorn, Aundra Millet, MD  budesonide-formoterol (SYMBICORT) 80-4.5 MCG/ACT inhaler Inhale 2 puffs into the lungs 2 (two) times daily. 09/10/17  Yes [provider]  fluticasone (FLONASE) 50 MCG/ACT nasal spray Place 2 sprays into both nostrils daily as needed for allergies or rhinitis.   Yes [provider]  levothyroxine (SYNTHROID) 125 MCG tablet Take 125 mcg by mouth daily before  breakfast.   Yes [provider]  Multiple Vitamin (MULTIVITAMIN WITH MINERALS) TABS tablet Take 1 tablet by mouth daily.   Yes [provider]  omeprazole (PRILOSEC) 40 MG capsule Take 40 mg by mouth daily.   Yes [provider]  polyethylene glycol (MIRALAX / GLYCOLAX) 17 g packet Take 17 g by mouth daily as needed for mild constipation. Patient taking differently: Take 17 g by mouth every other day. 08/29/20  Yes Love, Evlyn Kanner, PA-C  rosuvastatin (CRESTOR) 10 MG tablet Take 10 mg by mouth daily.   Yes [provider]  tamsulosin (FLOMAX) 0.4 MG CAPS capsule Take 1 capsule (0.4 mg total) by mouth daily after supper. 09/27/20  Yes Lovorn, Aundra Millet, MD  tiZANidine (ZANAFLEX) 4 MG tablet Take 2 tablets (8  mg total) by mouth at bedtime. For spasticity- can take  1.5 to 2 tabs nightly for spasms 09/27/20  Yes Lovorn, Aundra Millet, MD  traMADol (ULTRAM) 50 MG tablet Take 1 tablet (50 mg total) by mouth every 12 (twelve) hours as needed for severe pain. 08/29/20  Yes Love, Evlyn Kanner, PA-C  traZODone (DESYREL) 50 MG tablet Take 1-2 tablets (50-100 mg total) by mouth at bedtime. Patient taking differently: Take 50 mg by mouth at bedtime. 10/07/20  Yes Lovorn, Aundra Millet, MD  finasteride (PROSCAR) 5 MG tablet Take 1 tablet (5 mg total) by mouth at bedtime. Patient not taking: No sig reported 08/29/20   Jacquelynn Cree, PA-C     Review of Systems  Positive ROS: neg  All other systems have been reviewed and were otherwise negative with the exception of those mentioned in the HPI and as above.  Objective: Vital signs in last 24 hours: Temp:  [98.9 F (Keith.2 C)] 98.9 F (Keith.2 C) (06/24 1025) Pulse Rate:  [87] 87 (06/24 1025) Resp:  [18] 18 (06/24 1025) BP: (160)/(99) 160/99 (06/24 1025) SpO2:  [97 %] 97 % (06/24 1025) Weight:  [88.5 kg] 88.5 kg (06/24 1025)  General Appearance: Alert, cooperative, no distress, appears stated age Head: Normocephalic, without obvious abnormality, atraumatic Eyes: PERRL, conjunctiva/corneas clear, EOM's intact      Neck: Supple, symmetrical, trachea midline, Back: Symmetric, no curvature, ROM normal, no CVA tenderness Lungs:  respirations unlabored Heart: Regular rate and rhythm Abdomen: Soft, non-tender Extremities: Extremities normal, atraumatic, no cyanosis or edema Pulses: 2+ and symmetric all extremities Skin: Skin color, texture, turgor normal, no rashes or lesions  NEUROLOGIC:  Mental status: Alert and oriented x4, no aphasia, good attention span, fund of knowledge and memory  Motor Exam - grossly normal with motor spasticity of the upper and lower extremities left greater than right Sensory Exam - grossly normal Reflexes: 3+ Coordination - grossly normal Gait  -spastic Balance -decreased Cranial Nerves: I: smell Not tested  II: visual acuity  OS: nl    OD: nl  II: visual fields Full to confrontation  II: pupils Equal, round, reactive to light  III,VII: ptosis None  III,IV,VI: extraocular muscles  Full ROM  V: mastication Normal  V: facial light touch sensation  Normal  V,VII: corneal reflex  Present  VII: facial muscle function - upper  Normal  VII: facial muscle function - lower Normal  VIII: hearing Not tested  IX: soft palate elevation  Normal  IX,X: gag reflex Present  XI: trapezius strength  5/5  XI: sternocleidomastoid strength 5/5  XI: neck flexion strength  5/5  XII: tongue strength  Normal    Data Review Lab Results  Component Value  Date   WBC 7.3 01/17/2021   HGB 14.8 01/17/2021   HCT 42.9 01/17/2021   MCV 84.0 01/17/2021   PLT 164 01/17/2021   Lab Results  Component Value Date   NA 139 01/17/2021   K 3.9 01/17/2021   CL 107 01/17/2021   CO2 26 01/17/2021   BUN 15 01/17/2021   CREATININE 0.92 01/17/2021   GLUCOSE 94 01/17/2021   Lab Results  Component Value Date   INR 1.0 01/17/2021    Assessment:   Cervical neck pain with herniated nucleus pulposus/ spondylosis/ stenosis at C3-C7 with cervical spondylitic myelopathy  . Estimated body mass index is 27.2 kg/m as calculated from the following:   Height as of this encounter: 5\' 11"  (1.803 m).   Weight as of this encounter: 88.5 kg.  Patient has failed conservative therapy. Planned surgery : Posterior cervical decompression and lateral mass fusion and fixation C3-C7  Plan:   I explained the condition and procedure to the patient and answered any questions.  Patient wishes to proceed with procedure as planned. Understands risks/ benefits/ and expected or typical outcomes.  01/17/2021 1:54 PM

## 2021-01-17 NOTE — Op Note (Signed)
01/17/2021  4:44 PM  PATIENT:  Keith Downs  60 y.o. 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  Decompressive cervical laminectomy and medial facetectomy C4-C5-C6, 2.  Posterior cervical arthrodesis C3-C7 utilizing locally harvested morselized autologous bone graft and DBM putty, 3.  Segmental lateral mass fixation utilizing Alphatec lateral mass screws C3-C7 inclusive  SURGEON:  Marikay Alar, MD  ASSISTANTS: Dr. Maisie Fus  ANESTHESIA:   General  EBL: 150 ml  Total I/O In: 1200 [I.V.:1200] Out: 150 [Blood:150]  BLOOD ADMINISTERED: none  DRAINS: Medium Hemovac  SPECIMEN:  none  INDICATION FOR PROCEDURE: This patient presented with severe cervical spondylitic myelopathy.  He went a anterior cervical decompression and fusion about 5 months ago.  Follow-up imaging showed severe spinal stenosis with signal change in the cord despite anterior decompression.  Recommended posterior cervical laminectomy and instrumented fusion. Patient understood the risks, benefits, and alternatives and potential outcomes and wished to proceed.  PROCEDURE DETAILS: The patient was brought to the operating room. Generalized endotracheal anesthesia was induced. The patient was affixed a 3 point [place] headrest and rolled into the prone position on chest rolls. All pressure points were padded. The posterior cervical region was cleaned with Betadine scrub and prepped with DuraPrep and then draped in the usual sterile fashion. 7 cc of local anesthesia was injected and a dorsal midline incision made in the posterior cervical region and carried down to the cervical fascia. The fascia was opened and the paraspinous musculature was taken down to expose C3-C7. Intraoperative fluoroscopy confirmed my level and then the dissection was carried out over the lateral facets. I localized the midpoint of each lateral mass  and marked a region 1 mm medial to the midpoint of the lateral mass, and then drilled in an upward and outward direction into the safe zone of each lateral mass. I drilled to a depth of 12 mm and then checked my drill hole with a ball probe. I then placed a 14 mm lateral mass screws into the safe zone of each lateral mass from C3-C7 inclusive until they were 2 fingers tight. I then removed the spinous processes of C4-C5 and C6 and drilled the lamina down to an eggshell at each level.  I then gently decompressed the central canal with the 1 and 2 mm Kerrison punch from C4 to C6. Medial facetectomies were performed, and foraminotomies were performed at C4-5. Once the decompression was complete the dura was full and capacious and I could see the spinal cord pulsatile through the dura. I then decorticated the lateral masses and the facet joints and packed them with local autograft and morcellized allograft to perform arthrodesis from C3-C7 inclusive. I then placed rods into the multiaxial screw heads of the screws and locked these into position with the locking caps and anti-torque device. I then checked the final construct with AP/Lat fluoroscopy. I irrigated with saline solution containing bacitracin. I placed a medium Hemovac drain through separate stab incision, and lined the dura with Gelfoam. After hemostasis was achieved I closed the muscle and the fascia with 0 Vicryl, subcutaneous tissue with 2-0 Vicryl, and the subcuticular tissue with 3-0 Vicryl. The skin was closed with benzoin and Steri-Strips. A sterile dressing was applied, the patient was turned to the supine position and taken out of the headrest, awakened from general anesthesia and transferred to the recovery room in stable condition. At the end of the procedure all  sponge, needle and instrument counts were correct.    PLAN OF CARE: Admit to inpatient   PATIENT DISPOSITION:  PACU - hemodynamically stable.   Delay start of Pharmacological VTE  agent (>24hrs) due to surgical blood loss or risk of bleeding:  yes

## 2021-01-17 NOTE — Anesthesia Preprocedure Evaluation (Addendum)
Anesthesia Evaluation  Patient identified by MRN, date of birth, ID band Patient awake    Reviewed: Allergy & Precautions, NPO status , Patient's Chart, lab work & pertinent test results  Airway Mallampati: III  TM Distance: >3 FB Neck ROM: Limited    Dental no notable dental hx.    Pulmonary asthma ,    Pulmonary exam normal breath sounds clear to auscultation       Cardiovascular hypertension, + Peripheral Vascular Disease  Normal cardiovascular exam Rhythm:Regular Rate:Normal  Normal sinus rhythm Left axis deviation Incomplete right bundle branch block Abnormal ECG   Neuro/Psych  Headaches,  Neuromuscular disease (cervical myelopathy) negative psych ROS   GI/Hepatic hiatal hernia, GERD  ,  Endo/Other  Hypothyroidism   Renal/GU   negative genitourinary   Musculoskeletal  (+) Arthritis ,   Abdominal   Peds  Hematology  (+) anemia ,   Anesthesia Other Findings   Reproductive/Obstetrics                           Anesthesia Physical Anesthesia Plan  ASA: 3  Anesthesia Plan: General   Post-op Pain Management:    Induction: Intravenous  PONV Risk Score and Plan: 2 and Treatment may vary due to age or medical condition  Airway Management Planned: Oral ETT and Video Laryngoscope Planned  Additional Equipment: None  Intra-op Plan:   Post-operative Plan: Extubation in OR  Informed Consent: I have reviewed the patients History and Physical, chart, labs and discussed the procedure including the risks, benefits and alternatives for the proposed anesthesia with the patient or authorized representative who has indicated his/her understanding and acceptance.       Plan Discussed with: CRNA, Anesthesiologist and Surgeon  Anesthesia Plan Comments:        Anesthesia Quick Evaluation

## 2021-01-17 NOTE — Anesthesia Procedure Notes (Signed)
Procedure Name: Intubation Date/Time: 01/17/2021 2:41 PM Performed by: Lelon Perla, CRNA Pre-anesthesia Checklist: Patient identified, Emergency Drugs available, Suction available and Patient being monitored Patient Re-evaluated:Patient Re-evaluated prior to induction Oxygen Delivery Method: Circle system utilized Preoxygenation: Pre-oxygenation with 100% oxygen Induction Type: IV induction Ventilation: Mask ventilation without difficulty Laryngoscope Size: Glidescope and 4 Grade View: Grade I Tube type: Oral Tube size: 7.5 mm Number of attempts: 1 Airway Equipment and Method: Stylet and Oral airway Placement Confirmation: ETT inserted through vocal cords under direct vision, positive ETCO2 and breath sounds checked- equal and bilateral Secured at: 23 cm Tube secured with: Tape Dental Injury: Teeth and Oropharynx as per pre-operative assessment  Comments: Limited neck mobility. Neck maintained in neutral position throughout intubation.

## 2021-01-18 MED ORDER — OXYCODONE HCL 5 MG PO TABS: 5.0000 mg | ORAL_TABLET | ORAL | 0 refills | Status: AC | PRN

## 2021-01-18 NOTE — Discharge Summary (Signed)
Physician Discharge Summary  Patient ID: Keith Downs MRN: [id number] DOB/AGE: Apr 27, 1961 60 y.o.  Admit date: 01/17/2021 Discharge date: 01/18/2021  Admission Diagnoses:  Cervical myelopathy  Discharge Diagnoses:  Same Active Problems:   * No active hospital problems. *   Discharged Condition: Stable  Hospital Course:  Keith Downs is a 60 y.o. male who underwent elective posterior cervical decompression and fusion C3-7 on 6/25.  Postoperatively, he was admitted to the PCU.  His neurologic exam was unchanged postoperatively, with cervical myelopathy.  He was mobilized, his drain removed. He was tolerating a regular diet and his pain was well controlled.  He was deemed ready for DC.  Treatments: Surgery - posterior cervical decompression and fusion C3-7  Discharge Exam: Blood pressure (!) 120/93, pulse 97, temperature 98.5 F (36.9 C), temperature source Oral, resp. rate 18, height 5\' 11"  (1.803 m), weight 88.5 kg, SpO2 96 %. Awake, alert, oriented Speech fluent, appropriate CN grossly intact Spasticity in LUE > RUE, 4/5 L hand grip. Wound c/d/i  Disposition: Discharge disposition: 01-Home or Self Care        Allergies as of 01/18/2021       Reactions   Niacin And Related    Face flushed   Niacin Rash    Flushing        Medication List     TAKE these medications    baclofen 20 MG tablet Commonly known as: LIORESAL Take 1 tablet (20 mg total) by mouth 4 (four) times daily.   budesonide-formoterol 80-4.5 MCG/ACT inhaler Commonly known as: SYMBICORT Inhale 2 puffs into the lungs 2 (two) times daily.   fluticasone 50 MCG/ACT nasal spray Commonly known as: FLONASE Place 2 sprays into both nostrils daily as needed for allergies or rhinitis.   levothyroxine 125 MCG tablet Commonly known as: SYNTHROID Take 125 mcg by mouth daily before breakfast.   multivitamin with minerals Tabs tablet Take 1 tablet by mouth daily.   omeprazole 40 MG  capsule Commonly known as: PRILOSEC Take 40 mg by mouth daily.   oxyCODONE 5 MG immediate release tablet Commonly known as: Oxy IR/ROXICODONE Take 1 tablet (5 mg total) by mouth every 3 (three) hours as needed for moderate pain ((score 4 to 6)).   rosuvastatin 10 MG tablet Commonly known as: CRESTOR Take 10 mg by mouth daily.   tamsulosin 0.4 MG Caps capsule Commonly known as: FLOMAX Take 1 capsule (0.4 mg total) by mouth daily after supper.   tiZANidine 4 MG tablet Commonly known as: ZANAFLEX Take 2 tablets (8 mg total) by mouth at bedtime. For spasticity- can take  1.5 to 2 tabs nightly for spasms   traMADol 50 MG tablet Commonly known as: ULTRAM Take 1 tablet (50 mg total) by mouth every 12 (twelve) hours as needed for severe pain.       ASK your doctor about these medications    finasteride 5 MG tablet Commonly known as: PROSCAR Take 1 tablet (5 mg total) by mouth at bedtime.   polyethylene glycol 17 g packet Commonly known as: MIRALAX / GLYCOLAX Take 17 g by mouth daily as needed for mild constipation.   traZODone 50 MG tablet Commonly known as: DESYREL Take 1-2 tablets (50-100 mg total) by mouth at bedtime.               Durable Medical Equipment  (From admission, onward)           Start     Ordered   01/17/21 1811  DME Walker rolling  Once       Question Answer Comment  Walker: With 5 Inch Wheels   Patient needs a walker to treat with the following condition Gait instability      01/17/21 1810            Follow-up Information     Tia Alert, MD Follow up.   Specialty: Neurosurgery Contact information: [address] [place] [address] [phone]                 Signed: Bedelia Person 01/18/2021, 10:33 AM

## 2021-01-18 NOTE — Evaluation (Signed)
Physical Therapy Evaluation Patient Details Name: Keith Downs MRN: [id number] DOB: 1961-04-17 Today's Date: 01/18/2021   History of Present Illness  This 60 y.o. 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  PMH includes: GERD, HTN, PVD, PNA   Clinical Impression  Pt presents with condition above and deficits mentioned below, see PT Problem List. PTA, he was needing some assistance from his wife for dressing and bathing but did not need assistance for mobility while utilizing a RW. Pt lives with his wife in a 1-level house with 3 STE and bil handrails. Currently, pt is displaying spasticity and clonus in his L UE and L lower extremity, L lower extremity weakness, decreased sensation to light touch in his L lower extremity, balance deficits, and poor compliance with his cervical precautions. Pt needing repeated cues to maintain his cervical precautions throughout the session and educated the pt and his wife that they can use his old cervical collar as needed to prevent breaking the precautions. Pt needing minA for stairs, gait on level surfaces, and transfers with a RW this date. Pt would benefit from HHPT at d/c to maximize his safety and independence with all functional mobility. Will continue to follow acutely.    Follow Up Recommendations Home health PT;Supervision for mobility/OOB    Equipment Recommendations  None recommended by PT    Recommendations for Other Services       Precautions / Restrictions Precautions Precautions: Cervical Precaution Booklet Issued: Yes (comment) Precaution Comments: Pt with poor compliance with precautions, needing cues to remind him throughout session Restrictions Weight Bearing Restrictions: No      Mobility  Bed Mobility               General bed mobility comments: Pt in recliner upon arrival.    Transfers Overall transfer level: Needs  assistance Equipment used: Rolling walker (2 wheeled) Transfers: Sit to/from Stand Sit to Stand: Min assist         General transfer comment: Sit to stand 3x during session with minA to steady, good initiation by pt.  Ambulation/Gait Ambulation/Gait assistance: Min assist Gait Distance (Feet): 90 Feet Assistive device: Rolling walker (2 wheeled) Gait Pattern/deviations: Step-through pattern;Decreased stride length;Decreased step length - left;Decreased dorsiflexion - left;Trunk flexed Gait velocity: reduced Gait velocity interpretation: <1.8 ft/sec, indicate of risk for recurrent falls General Gait Details: Pt with flexed posture and decreased L step length and foot clearance. Reports it is improved today vs prior to surgery though as he was able to avoid completely dragging his foot for longer periods of time today. As pt fatigued his L foot drag increased. MinA for safety and steadying, no overt LOB.  Stairs Stairs: Yes Stairs assistance: Min assist;Mod assist Stair Management: One rail Right;One rail Left;Step to pattern;Sideways Number of Stairs: 6 General stair comments: Ascending and descending sideways x1 set of 3 stairs with bil hands on L rail then x1 set of 3 stairs with bil hands on R rail. Improved stability noted when leading up with L leg (minA) with sideways approach than with R leg (modA). Needed minA for descending. No LOB.  Wheelchair Mobility    Modified Rankin (Stroke Patients Only)       Balance Overall balance assessment: Needs assistance Sitting-balance support: Feet supported Sitting balance-Leahy Scale: Fair     Standing balance support: Bilateral upper extremity supported Standing balance-Leahy Scale: Poor Standing balance comment: Reliant on bil UE support.  Pertinent Vitals/Pain Pain Assessment: Faces Faces Pain Scale: Hurts little more Pain Location: buttocks/tail bone with sitting Pain Descriptors /  Indicators: Aching;Dull Pain Intervention(s): Limited activity within patient's tolerance;Monitored during session;Repositioned (educated pt to perform regular pressure relief and use doughnut pillow)    Home Living Family/patient expects to be discharged to:: Private residence Living Arrangements: Spouse/significant other Available Help at Discharge: Family;Available 24 hours/day Type of Home: House Home Access: Stairs to enter Entrance Stairs-Rails: Can reach both;Left;Right Entrance Stairs-Number of Steps: 3 Home Layout: One level Home Equipment: Walker - 2 wheels;Cane - single point;Wheelchair - manual;Tub bench;Grab bars - toilet Additional Comments: wife is retired and able to assist as needed    Prior Function Level of Independence: Needs assistance   Gait / Transfers Assistance Needed: Pt has been ambulating with RW since leaving CIR.  He uses AFO on Lt LE when out in the community, but not in the house.  ADL's / Homemaking Assistance Needed: He reports wife assists with bathing his back and posterior peri area as well as with pulling pants over his hips        Hand Dominance   Dominant Hand: Right    Extremity/Trunk Assessment   Upper Extremity Assessment Upper Extremity Assessment: Defer to OT evaluation    Lower Extremity Assessment Lower Extremity Assessment: LLE deficits/detail LLE Deficits / Details: pt has residual weakness and spasticity from previous surgery in Feb. 2022.  He reports he feels his movement and spasticity are better today; noted clonus; decreased strength noted primarily in his hip flexors, hamstrings, and anterior tibialis; slight numbness at thigh and knee LLE Sensation: decreased light touch (slight numbness at thigh and knee) LLE Coordination: decreased fine motor;decreased gross motor    Cervical / Trunk Assessment Cervical / Trunk Assessment: Kyphotic;Other exceptions Cervical / Trunk Exceptions: c/p ACDF and new posterior cervical  fusion  Communication   Communication: No difficulties  Cognition Arousal/Alertness: Awake/alert Behavior During Therapy: WFL for tasks assessed/performed Overall Cognitive Status: Within Functional Limits for tasks assessed                                        General Comments General comments (skin integrity, edema, etc.): Pt instructed to have wife with him at all times when up on his feet and to use gait belt at all times when standing or ambulating or negotiating stairs    Exercises     Assessment/Plan    PT Assessment Patient needs continued PT services  PT Problem List Decreased strength;Decreased range of motion;Decreased activity tolerance;Decreased balance;Decreased mobility;Decreased coordination;Decreased knowledge of precautions;Impaired sensation;Impaired tone       PT Treatment Interventions DME instruction;Gait training;Stair training;Functional mobility training;Therapeutic activities;Therapeutic exercise;Balance training;Neuromuscular re-education;Patient/family education    PT Goals (Current goals can be found in the Care Plan section)  Acute Rehab PT Goals Patient Stated Goal: to go home today PT Goal Formulation: With patient/family Time For Goal Achievement: 01/25/21 Potential to Achieve Goals: Good    Frequency Min 5X/week   Barriers to discharge        Co-evaluation               AM-PAC PT "6 Clicks" Mobility  Outcome Measure Help needed turning from your back to your side while in a flat bed without using bedrails?: A Little Help needed moving from lying on your back to sitting on the side of a flat bed  without using bedrails?: A Little Help needed moving to and from a bed to a chair (including a wheelchair)?: A Little Help needed standing up from a chair using your arms (e.g., wheelchair or bedside chair)?: A Little Help needed to walk in hospital room?: A Little Help needed climbing 3-5 steps with a railing? : A  Little 6 Click Score: 18    End of Session Equipment Utilized During Treatment: Gait belt Activity Tolerance: Patient tolerated treatment well Patient left: in chair;with call bell/phone within reach;with chair alarm set;with family/visitor present Nurse Communication: Mobility status PT Visit Diagnosis: Unsteadiness on feet (R26.81);Other abnormalities of gait and mobility (R26.89);Muscle weakness (generalized) (M62.81);Difficulty in walking, not elsewhere classified (R26.2);Other symptoms and signs involving the nervous system (R29.898)    Time: 4081-4481 PT Time Calculation (min) (ACUTE ONLY): 31 min   Charges:   PT Evaluation $PT Eval Moderate Complexity: 1 Mod PT Treatments $Gait Training: 8-22 mins        Raymond Gurney, PT, DPT Acute Rehabilitation Services  Pager: [phone] Office: [phone]   Jewel Baize 01/18/2021, 11:45 AM

## 2021-01-18 NOTE — Progress Notes (Signed)
Occupational Therapy Evaluation  Pt presents to OT with the below listed diagnosis and deficits.  He currently demonstrates weakness and spasticity Lt UE and Lt LE, impaired balance, and decreased activity tolerance.  He is able to perform ADLs with min A, and functional mobility with min A.  He reports he lives with wife, who can provide 24 hour assist, and who was assisting him minimally with ADLs.  He was able to ambulate mod I, per his report.  Recommend HHOT initially with transition back to OPOT.   He has all needed DME.  Also recommend he have direct assist anytime he is up standing and ambulating and that he utilize gait belt,when up,for safety.    01/18/21 0700  OT Visit Information  Last OT Received On 01/18/21  Assistance Needed +1  History of Present Illness This 60 y.o. 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  PMH includes: GERD, HTN, PVD, PNA  Precautions  Precautions Cervical  Precaution Booklet Issued Yes (comment)  Precaution Comments Pt has very little recollection of precautions from last surgery.  Reviewed precautions with him, and he was able to verbalize understanding  Home Living  Family/patient expects to be discharged to: Private residence  Living Arrangements Spouse/significant other  Available Help at Discharge Family;Available 24 hours/day  Type of Home House  Home Access Stairs to enter  Entrance Stairs-Number of Steps 3  Entrance Stairs-Rails Can reach both;Left;Right  Home Layout One level  Bathroom Conservation officer, historic buildings Handicapped height  Bathroom Accessibility Yes  Home Equipment Walker - 2 wheels;Cane - single point;Wheelchair - manual;Tub bench;Grab bars - toilet  Additional Comments wife is retired and able to assist as needed  Prior Function  Level of Independence Needs assistance  Gait / Transfers Assistance Needed Pt has been ambulating with  RW since leaving [facility].  He uses AFO on Lt LE when out in the community, but not in the house.  ADL's / Homemaking Assistance Needed He reports wife assists with bathing his back and posterior peri area as well as with pulling pants over his hips  Communication  Communication No difficulties  Pain Assessment  Pain Assessment 0-10  Pain Score 3  Pain Location buttocks/tail bone with sitting  Pain Descriptors / Indicators Aching  Pain Intervention(s) Monitored during session;Repositioned  Cognition  Arousal/Alertness Awake/alert  Behavior During Therapy WFL for tasks assessed/performed  Overall Cognitive Status Within Functional Limits for tasks assessed  Upper Extremity Assessment  Upper Extremity Assessment LUE deficits/detail  LUE Deficits / Details pt has residual weakness and spasticity from previous surgery in Feb. 2022.  He reports he feels his movement and spasticity are better today.  He demonstrates AROM of Lt shoulder to ~125*, AROM elbow distally WFL (elbow extension somewhat limited due to old elbow surgery).  He demonsrates clawing of little finger since last surgery which is unchanged.  Movement is dominated by flexor spasticity, but with effort, he is able to move through the spasticity  LUE Coordination decreased fine motor;decreased gross motor  Lower Extremity Assessment  Lower Extremity Assessment Defer to PT evaluation  Cervical / Trunk Assessment  Cervical / Trunk Assessment Kyphotic;Other exceptions  Cervical / Trunk Exceptions c/p ACDF and new posterior cervical fusion  ADL  Overall ADL's  Needs assistance/impaired  Eating/Feeding Modified independent;Sitting  Grooming Wash/dry hands;Wash/dry face;Oral care;Brushing hair;Standing;Minimal assistance  Grooming Details (indicate cue type and reason) min A for balance.  Reviewed precautions  with oral care and to spit into a cup to avoid bending  Upper Body Bathing Minimal assistance;Sitting  Lower Body Bathing Minimal  assistance;Sit to/from stand  Lower Body Bathing Details (indicate cue type and reason) able to perform figure 4.  Assist for peri area and for balance  Upper Body Dressing  Minimal assistance;Sitting  Lower Body Dressing Minimal assistance;Sit to/from stand  Lower Body Dressing Details (indicate cue type and reason) able to perform figure 4.  Requires assist for shoes and to pull pants over hips in standing  Toilet Transfer Minimal assistance;Ambulation;Comfort height toilet;BSC;RW  Toileting- Clothing Manipulation and Hygiene Minimal assistance;Sit to/from stand  Functional mobility during ADLs Minimal assistance;Rolling walker  Bed Mobility  General bed mobility comments pt sitting EOB  Transfers  Overall transfer level Needs assistance  Equipment used Rolling walker (2 wheeled)  Transfers Sit to/from [employer] Transfers  Sit to Stand Mod assist;Min assist  Stand pivot transfers Min assist  General transfer comment mod A to power up from bed, and min A from recliner.  Pt initially with posterior lean.  He requires assist for balance  Balance  Overall balance assessment Needs assistance  Sitting-balance support Feet supported  Sitting balance-Leahy Scale Fair  Standing balance support Bilateral upper extremity supported  Standing balance-Leahy Scale Poor  Standing balance comment pt requires min A for static standing  General Comments  General comments (skin integrity, edema, etc.) Pt instructed to have wife with him at all times when up on his feet and to use gait belt at all times when standing or ambulating  Exercises  Exercises Other exercises  Other Exercises  Other Exercises Pt instructed in isolated shoulder ROM exercises - shoulder flexion with thumb up to improve alignment, then worked on touching the top of his head with shoulder in good alignment.  Pt then instructed to hold hands in prayer position at chest level, and while keeping hands fully together raising UEs  overhead  OT - End of Session  Equipment Utilized During Treatment Rolling walker  Activity Tolerance Patient tolerated treatment well  Patient left in chair;with chair alarm set  Nurse Communication Mobility status  OT Assessment  OT Recommendation/Assessment Patient needs continued OT Services  OT Visit Diagnosis Muscle weakness (generalized) (M62.81)  OT Problem List Decreased activity tolerance;Impaired balance (sitting and/or standing);Decreased range of motion;Decreased strength;Decreased knowledge of use of DME or AE;Decreased knowledge of precautions;Pain  OT Plan  OT Frequency (ACUTE ONLY) Min 2X/week  OT Treatment/Interventions (ACUTE ONLY) Self-care/ADL training;DME and/or AE instruction;Therapeutic activities;Patient/family education;Balance training;Neuromuscular education  AM-PAC OT "6 Clicks" Daily Activity Outcome Measure (Version 2)  Help from another person eating meals? 4  Help from another person taking care of personal grooming? 3  Help from another person toileting, which includes using toliet, bedpan, or urinal? 3  Help from another person bathing (including washing, rinsing, drying)? 3  Help from another person to put on and taking off regular upper body clothing? 3  Help from another person to put on and taking off regular lower body clothing? 3  6 Click Score 19  OT Recommendation  Follow Up Recommendations Supervision/Assistance - 24 hour;Home health OT (HH initially then transitioning to OP)  OT Equipment None recommended by OT  Individuals Consulted  Consulted and Agree with Results and Recommendations Patient  Acute Rehab OT Goals  Patient Stated Goal to go home today  OT Goal Formulation With patient  Time For Goal Achievement 01/31/21  Potential to Achieve Goals Good  OT  Time Calculation  OT Start Time (ACUTE ONLY) 0723  OT Stop Time (ACUTE ONLY) 0758  OT Time Calculation (min) 35 min  OT General Charges  $OT Visit 1 Visit  OT Evaluation  $OT  Eval Moderate Complexity 1 Mod  OT Treatments  $Self Care/Home Management  8-22 mins  Written Expression  Dominant Hand Right   Eber Jones., OTR/L Acute Rehabilitation Services Pager [phone] Office [phone]

## 2021-01-18 NOTE — Progress Notes (Signed)
Occupational Therapy Progress Note  Pt provided with written HEP for Lt UE.  He was able to return demonstration and performed each exercise x 10.  Recommend HHOT at discharge.  Wife present and confirms she can provide necessary level of assist.     01/18/21 1148  OT Visit Information  Last OT Received On 01/18/21  Assistance Needed +1  History of Present Illness This 60 y.o. 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  PMH includes: GERD, HTN, PVD, PNA  Precautions  Precautions Cervical  Precaution Booklet Issued Yes (comment)  Precaution Comments reiterated precautions  Pain Assessment  Pain Assessment Faces  Faces Pain Scale 2  Pain Location buttocks/tail bone with sitting  Pain Descriptors / Indicators Aching;Dull  Pain Intervention(s) Monitored during session  Cognition  Arousal/Alertness Awake/alert  Behavior During Therapy WFL for tasks assessed/performed  Overall Cognitive Status Within Functional Limits for tasks assessed  Upper Extremity Assessment  Upper Extremity Assessment LUE deficits/detail  LUE Deficits / Details pt has residual weakness and spasticity from previous surgery in Feb. 2022.  He reports he feels his movement and spasticity are better today.  He demonstrates AROM of Lt shoulder to ~125*, AROM elbow distally WFL (elbow extension somewhat limited due to old elbow surgery).  He demonsrates clawing of little finger since last surgery which is unchanged.  Movement is dominated by flexor spasticity, but with effort, he is able to move through the spasticity  LUE Coordination decreased fine motor;decreased gross motor  General Comments  General comments (skin integrity, edema, etc.) wife present and reiterated recommendation for direct assist/supervision when he is up and ambulating and the need to utilize gait belt.  She verbalized understanding  Exercises  Exercises Other  exercises  Other Exercises  Other Exercises Pt provided with written HEP for isolated shoulder flexion with thumb up to improve alignment of shoulder, and shoulder flexion wtih prayer hands to engage external rotators.  He was able to perform each x 10 demonstrating good carry over of info.  OT - End of Session  Activity Tolerance Patient tolerated treatment well  Patient left in chair;with chair alarm set  Nurse Communication Mobility status  OT Assessment/Plan  OT Plan Discharge plan remains appropriate  OT Visit Diagnosis Muscle weakness (generalized) (M62.81)  OT Frequency (ACUTE ONLY) Min 2X/week  Follow Up Recommendations Supervision/Assistance - 24 hour;Home health OT  OT Equipment None recommended by OT  AM-PAC OT "6 Clicks" Daily Activity Outcome Measure (Version 2)  Help from another person eating meals? 4  Help from another person taking care of personal grooming? 3  Help from another person toileting, which includes using toliet, bedpan, or urinal? 3  Help from another person bathing (including washing, rinsing, drying)? 3  Help from another person to put on and taking off regular upper body clothing? 3  Help from another person to put on and taking off regular lower body clothing? 3  6 Click Score 19  OT Goal Progression  Progress towards OT goals Progressing toward goals  OT Time Calculation  OT Start Time (ACUTE ONLY) 1135  OT Stop Time (ACUTE ONLY) 1144  OT Time Calculation (min) 9 min  OT General Charges  $OT Visit 1 Visit  OT Treatments  $Neuromuscular Re-education 8-22 mins  Eber Jones., OTR/L Acute Rehabilitation Services Pager [phone] Office [phone]

## 2021-01-18 NOTE — Discharge Instructions (Signed)
Can remove transparent dressing and shower 48 hours after surgery Walk as much as possible No heavy lifting >10 lbs No excessive bending/twisting at the waist  

## 2021-01-20 ENCOUNTER — Encounter (HOSPITAL_COMMUNITY): Payer: Self-pay | Admitting: Neurological Surgery

## 2021-01-21 NOTE — Anesthesia Postprocedure Evaluation (Signed)
Anesthesia Post Note  Patient: Keith Downs  Procedure(s) Performed: Posterior cervical fusion with lateral mass fixation, cervical laminectomy Cervical three-seven (Spine Cervical)     Patient location during evaluation: PACU Anesthesia Type: General Level of consciousness: awake Pain management: pain level controlled Vital Signs Assessment: post-procedure vital signs reviewed and stable Respiratory status: spontaneous breathing and respiratory function stable Cardiovascular status: stable Postop Assessment: no apparent nausea or vomiting Anesthetic complications: no   No notable events documented.  Last Vitals:  Vitals:   01/18/21 0753 01/18/21 0844  BP: (!) 120/93   Pulse: 97   Resp: 18   Temp: 36.9 C   SpO2: 96% 96%    Last Pain:  Vitals:   01/18/21 0753  TempSrc: Oral  PainSc:                  [name]

## 2021-01-23 ENCOUNTER — Telehealth: Payer: Self-pay | Admitting: Physical Medicine and Rehabilitation

## 2021-01-23 MED ORDER — CITALOPRAM HYDROBROMIDE 20 MG PO TABS: 20.0000 mg | ORAL_TABLET | Freq: Every day | ORAL | 3 refills | Status: AC

## 2021-01-23 NOTE — Telephone Encounter (Signed)
  Put on Oxycodone/percocet 10/325 mg- made him feel real weak.   Surgery was last Friday.  Now walking a little better.   Has had 5 mg in past- 1 pill  They dropped Baclofen to 20 mg- made him feel squeezed awful. So increased back to 30 mg - and doing a little better- taking 4x/day- Things real bad at night-   Has pooped 3x since surgery- doing better 2 yesterday and 1 today- so feeling better BP down to 130s form 170s systolic.    Plan: So take Baclofen - 30 mg during day and 4th dose/ 40 mg at night.  Try taking Oxycodone 1/2 tab at a time- q6 hours prn.  Depressed as well- also been nervous as well- Celexa- can help as well.  Try Celexa/Citalopram 20 mg daily-  Will send in Rx.  Take miralax daily until off pain meds. Then still as needed.

## 2021-01-24 ENCOUNTER — Ambulatory Visit: Payer: BC Managed Care – PPO | Admitting: Physical Medicine & Rehabilitation

## 2021-01-26 ENCOUNTER — Other Ambulatory Visit: Payer: Self-pay | Admitting: Physical Medicine and Rehabilitation

## 2021-02-14 ENCOUNTER — Encounter: Payer: BC Managed Care – PPO | Admitting: Physical Medicine and Rehabilitation

## 2021-02-24 DEATH — deceased

## 2021-02-27 ENCOUNTER — Telehealth: Payer: Self-pay | Admitting: Physical Medicine and Rehabilitation

## 2021-02-27 NOTE — Telephone Encounter (Signed)
Keith Downs patients attorney called to let you know that patient committed suicide on 2021/02/14.  She is going to need your help with getting death benefits for family.  She is going to send you a letter.  Please call her at [phone].  She wanted to thank you with all that you have done for the patient.

## 2021-03-14 ENCOUNTER — Ambulatory Visit: Payer: BC Managed Care – PPO | Admitting: Physical Medicine & Rehabilitation

## 2021-12-14 IMAGING — DX DG CERVICAL SPINE 1V
1 series · 1 of 1 positions shown · non-contrast
Comparison: Cervical MRI 08/09/2020

CLINICAL DATA: Left  neck and  body numbness after neck surgery

EXAM:
DG CERVICAL SPINE - 1 VIEW

[c-spine lat]
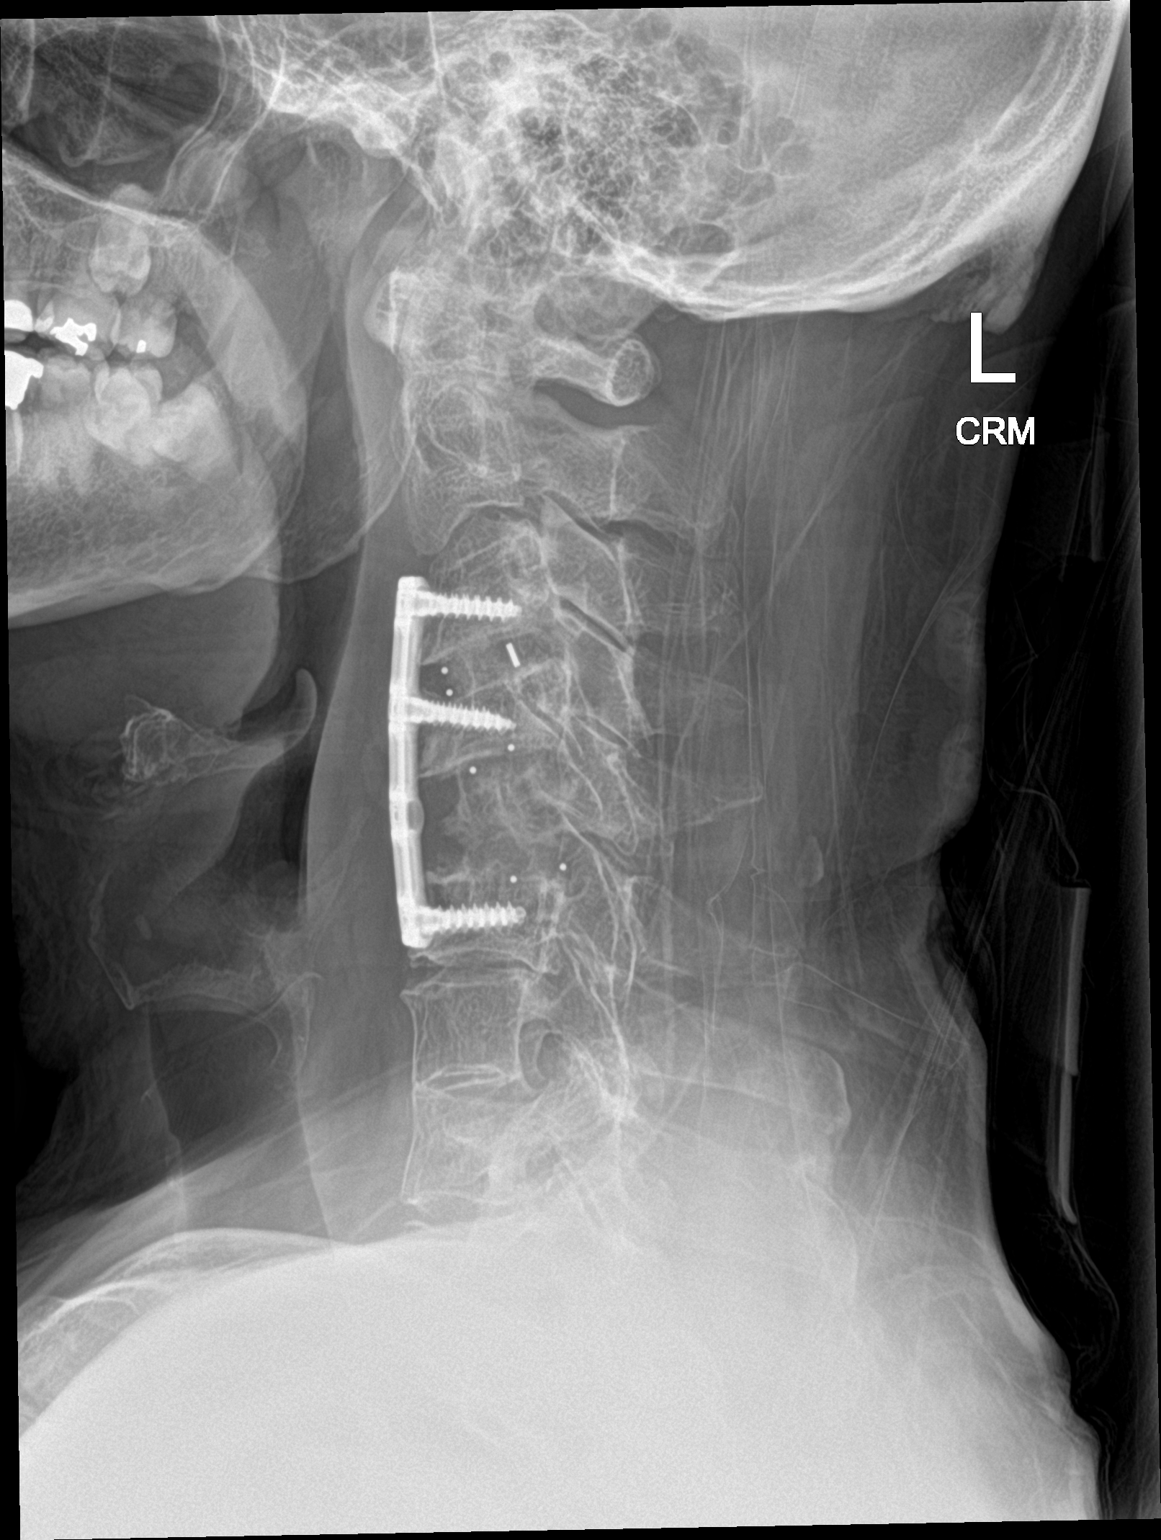

[1 of 1 positions shown; findings below may reference images not displayed]

FINDINGS: ACDF with anterior plate extending from C3 through C6. Corpectomy at
C5 with bone graft at the corpectomy site. Bone graft is mildly
tilted but appears to be in satisfactory position. Interbody spacer
at L3-4 in good position

Disc degeneration and spurring at C6-7. Negative for fracture. Mild
prevertebral soft tissue swelling which was also present on the
prior MRI.
IMPRESSION: ACDF C3 through C6.  Corpectomy C5.

Disc degeneration and spondylosis C6-7

Prevertebral soft tissue swelling again noted.
